# Patient Record
Sex: Female | Born: 1960 | Race: White | Hispanic: No | Smoking: Former smoker
Health system: Southern US, Community
[De-identification: ages and names within clinical notes are randomized; demographics above are authoritative.]

## PROBLEM LIST (undated history)

## (undated) DIAGNOSIS — R51 Headache: Secondary | ICD-10-CM

## (undated) DIAGNOSIS — R002 Palpitations: Secondary | ICD-10-CM

## (undated) DIAGNOSIS — Z9889 Other specified postprocedural states: Secondary | ICD-10-CM

## (undated) DIAGNOSIS — M1990 Unspecified osteoarthritis, unspecified site: Secondary | ICD-10-CM

## (undated) DIAGNOSIS — G8929 Other chronic pain: Secondary | ICD-10-CM

## (undated) DIAGNOSIS — J449 Chronic obstructive pulmonary disease, unspecified: Secondary | ICD-10-CM

## (undated) DIAGNOSIS — F319 Bipolar disorder, unspecified: Secondary | ICD-10-CM

## (undated) DIAGNOSIS — E785 Hyperlipidemia, unspecified: Secondary | ICD-10-CM

## (undated) DIAGNOSIS — R519 Headache, unspecified: Secondary | ICD-10-CM

## (undated) DIAGNOSIS — G629 Polyneuropathy, unspecified: Secondary | ICD-10-CM

## (undated) DIAGNOSIS — T8859XA Other complications of anesthesia, initial encounter: Secondary | ICD-10-CM

## (undated) DIAGNOSIS — I10 Essential (primary) hypertension: Secondary | ICD-10-CM

## (undated) DIAGNOSIS — I82409 Acute embolism and thrombosis of unspecified deep veins of unspecified lower extremity: Secondary | ICD-10-CM

## (undated) DIAGNOSIS — K219 Gastro-esophageal reflux disease without esophagitis: Secondary | ICD-10-CM

## (undated) DIAGNOSIS — R112 Nausea with vomiting, unspecified: Secondary | ICD-10-CM

## (undated) DIAGNOSIS — T4145XA Adverse effect of unspecified anesthetic, initial encounter: Secondary | ICD-10-CM

## (undated) DIAGNOSIS — G4730 Sleep apnea, unspecified: Secondary | ICD-10-CM

## (undated) HISTORY — DX: Polyneuropathy, unspecified: G62.9

## (undated) HISTORY — PX: FOOT SURGERY: SHX648

## (undated) HISTORY — DX: Hyperlipidemia, unspecified: E78.5

## (undated) HISTORY — DX: Chronic obstructive pulmonary disease, unspecified: J44.9

## (undated) HISTORY — DX: Acute embolism and thrombosis of unspecified deep veins of unspecified lower extremity: I82.409

## (undated) HISTORY — DX: Other chronic pain: G89.29

---

## 1990-06-06 HISTORY — PX: TUBAL LIGATION: SHX77

## 1996-06-06 HISTORY — PX: ABDOMINAL HYSTERECTOMY: SHX81

## 2000-04-02 ENCOUNTER — Emergency Department (HOSPITAL_COMMUNITY): Admission: EM | Admit: 2000-04-02 | Discharge: 2000-04-02 | Payer: Self-pay | Admitting: Emergency Medicine

## 2000-04-02 ENCOUNTER — Encounter: Payer: Self-pay | Admitting: Emergency Medicine

## 2000-05-08 ENCOUNTER — Encounter (INDEPENDENT_AMBULATORY_CARE_PROVIDER_SITE_OTHER): Payer: Self-pay | Admitting: Specialist

## 2000-05-08 ENCOUNTER — Ambulatory Visit (HOSPITAL_COMMUNITY): Admission: RE | Admit: 2000-05-08 | Discharge: 2000-05-08 | Payer: Self-pay | Admitting: Gastroenterology

## 2001-01-07 ENCOUNTER — Encounter: Payer: Self-pay | Admitting: Orthopedic Surgery

## 2001-01-07 ENCOUNTER — Ambulatory Visit (HOSPITAL_COMMUNITY): Admission: RE | Admit: 2001-01-07 | Discharge: 2001-01-07 | Payer: Self-pay | Admitting: Orthopedic Surgery

## 2001-12-04 ENCOUNTER — Other Ambulatory Visit: Admission: RE | Admit: 2001-12-04 | Discharge: 2001-12-04 | Payer: Self-pay | Admitting: *Deleted

## 2002-12-05 ENCOUNTER — Other Ambulatory Visit: Admission: RE | Admit: 2002-12-05 | Discharge: 2002-12-05 | Payer: Self-pay | Admitting: *Deleted

## 2003-01-21 ENCOUNTER — Encounter: Payer: Self-pay | Admitting: Gastroenterology

## 2003-01-21 ENCOUNTER — Encounter: Admission: RE | Admit: 2003-01-21 | Discharge: 2003-01-21 | Payer: Self-pay | Admitting: Gastroenterology

## 2003-06-07 HISTORY — PX: CHOLECYSTECTOMY: SHX55

## 2003-12-10 ENCOUNTER — Other Ambulatory Visit: Admission: RE | Admit: 2003-12-10 | Discharge: 2003-12-10 | Payer: Self-pay | Admitting: *Deleted

## 2004-02-10 ENCOUNTER — Emergency Department (HOSPITAL_COMMUNITY): Admission: EM | Admit: 2004-02-10 | Discharge: 2004-02-10 | Payer: Self-pay | Admitting: Family Medicine

## 2004-04-05 ENCOUNTER — Encounter (INDEPENDENT_AMBULATORY_CARE_PROVIDER_SITE_OTHER): Payer: Self-pay | Admitting: Specialist

## 2004-04-05 ENCOUNTER — Ambulatory Visit (HOSPITAL_COMMUNITY): Admission: RE | Admit: 2004-04-05 | Discharge: 2004-04-05 | Payer: Self-pay | Admitting: Gastroenterology

## 2004-08-23 ENCOUNTER — Ambulatory Visit (HOSPITAL_COMMUNITY): Admission: RE | Admit: 2004-08-23 | Discharge: 2004-08-23 | Payer: Self-pay | Admitting: Gastroenterology

## 2004-08-24 ENCOUNTER — Ambulatory Visit (HOSPITAL_COMMUNITY): Admission: RE | Admit: 2004-08-24 | Discharge: 2004-08-24 | Payer: Self-pay | Admitting: Gastroenterology

## 2004-08-26 ENCOUNTER — Encounter: Admission: RE | Admit: 2004-08-26 | Discharge: 2004-08-26 | Payer: Self-pay | Admitting: General Surgery

## 2004-09-02 ENCOUNTER — Encounter (INDEPENDENT_AMBULATORY_CARE_PROVIDER_SITE_OTHER): Payer: Self-pay | Admitting: Specialist

## 2004-09-02 ENCOUNTER — Ambulatory Visit (HOSPITAL_COMMUNITY): Admission: RE | Admit: 2004-09-02 | Discharge: 2004-09-02 | Payer: Self-pay | Admitting: General Surgery

## 2005-02-16 ENCOUNTER — Other Ambulatory Visit: Admission: RE | Admit: 2005-02-16 | Discharge: 2005-02-16 | Payer: Self-pay | Admitting: *Deleted

## 2006-02-20 ENCOUNTER — Other Ambulatory Visit: Admission: RE | Admit: 2006-02-20 | Discharge: 2006-02-20 | Payer: Self-pay | Admitting: *Deleted

## 2006-06-06 HISTORY — PX: OTHER SURGICAL HISTORY: SHX169

## 2006-07-12 ENCOUNTER — Encounter: Admission: RE | Admit: 2006-07-12 | Discharge: 2006-07-12 | Payer: Self-pay | Admitting: Family Medicine

## 2006-07-18 ENCOUNTER — Encounter: Admission: RE | Admit: 2006-07-18 | Discharge: 2006-07-18 | Payer: Self-pay | Admitting: Family Medicine

## 2007-01-18 ENCOUNTER — Encounter: Admission: RE | Admit: 2007-01-18 | Discharge: 2007-01-18 | Payer: Self-pay | Admitting: Family Medicine

## 2007-01-21 ENCOUNTER — Encounter: Admission: RE | Admit: 2007-01-21 | Discharge: 2007-01-21 | Payer: Self-pay | Admitting: Family Medicine

## 2007-02-21 ENCOUNTER — Other Ambulatory Visit: Admission: RE | Admit: 2007-02-21 | Discharge: 2007-02-21 | Payer: Self-pay | Admitting: *Deleted

## 2007-03-07 ENCOUNTER — Encounter
Admission: RE | Admit: 2007-03-07 | Discharge: 2007-03-07 | Payer: Self-pay | Admitting: Physical Medicine and Rehabilitation

## 2007-05-14 ENCOUNTER — Ambulatory Visit (HOSPITAL_COMMUNITY): Admission: RE | Admit: 2007-05-14 | Discharge: 2007-05-14 | Payer: Self-pay | Admitting: Orthopedic Surgery

## 2008-03-17 ENCOUNTER — Other Ambulatory Visit: Admission: RE | Admit: 2008-03-17 | Discharge: 2008-03-17 | Payer: Self-pay | Admitting: Gynecology

## 2010-06-27 ENCOUNTER — Encounter: Payer: Self-pay | Admitting: Gastroenterology

## 2010-06-28 ENCOUNTER — Encounter: Payer: Self-pay | Admitting: Physical Medicine and Rehabilitation

## 2010-10-19 NOTE — Op Note (Signed)
NAMEJINNA, WEINMAN               ACCOUNT NO.:  [id number]   MEDICAL RECORD NO.:  [id number]          PATIENT TYPE:  AMB   LOCATION:  DAY                          FACILITY:  [facility], [place]   PHYSICIAN:  Ollen Gross, M.D.    DATE OF BIRTH:  April 02, 1961   DATE OF PROCEDURE:  05/14/2007  DATE OF DISCHARGE:                               OPERATIVE REPORT   PREOPERATIVE DIAGNOSIS:  Left hip labral tear plus chondral defect of  acetabulum.   POSTOPERATIVE DIAGNOSIS:  Left hip labral tear plus chondral defect of  acetabulum.   PROCEDURE:  Left hip arthroscopy with labral debridement and  chondroplasty.   SURGEON:  Ollen Gross, M.D.   ASSISTANT:  Avel Peace PA-C   ANESTHESIA:  General.   BLOOD LOSS:  Minimal.   DRAINS:  None.   COMPLICATIONS:  None.   CONDITION:  Stable to recovery room.   BRIEF CLINICAL NOTE:  Christy is a 50 year old female abraded with  greater than a 1-year history of significant left hip pain and popping.  Radiographs show minimal degenerative change.  MRI shows probable labral  tear.  Given her intractable pain and failure of nonoperative  management, she presents now for arthroscopy with debridement.   PROCEDURE IN DETAIL:  After successful administration of general  anesthetic, the patient was placed in a right lateral decubitus position  with the left side up and bottom leg well padded.  The left leg was  draped over the well-padded perineal post and foot placed in well-padded  traction boot.  Under fluoroscopic guidance traction is applied across  the left lower extremity until the hip was adequately distracted.  The  thigh was then prepped and draped in the usual sterile fashion.  Spinal  needles were then passed in the anterior posterior peritrochanteric  portal sites.  Both are confirmed to enter the joint about passing  saline through the posterior spinal needle and having it outflow through  the anterior needle.  The posterior portal was created  with a knife and  then with the cannulated dilators, we placed the inflow cannula and  camera.  Arthroscopic visualization proceeds.  Femoral head looks normal  throughout its entire extent.  The fovea looks normal.  The posterior  half of the joint and superior posterior two thirds looked fine.  Anteriorly there is delamination of the anterior inferior acetabular  cartilage.  This is in about a 1 x 1 cm area at about the 7 and 8  o'clock position.  There is a labral tear associated with it.  The  labral tear goes up to about the 9 o'clock position.  Anterior portal  was created and the labral tear is debrided back to stable base with the  shaver and sealed off with a ArthroCare device.  We deroofed the  unstable cartilage from the underlying acetabular bone.  This was done  with the shaver and the ArthroCare.  We then shaved down the exposed  bone to abrade it or hopeful formation of fibrocartilage.  The total  area was about 1 x 0.5 cm.  The edges were probed  and found to be  stable.  The rest of the cartilage in the adjacent area looks normal.  The joint is again inspected.  No further cartilage tears or loose  bodies.  The arthroscopic equipment was then removed from the anterior  portal.  20 mL of 0.25% Marcaine with epi injected through the inflow  cannula and that is removed.  Once the equipment is out then we released  the traction and the hip concentrically reduced.  The incision is then  closed interrupted 4-0 nylon and the patient is removed from  the  traction boot, placed in supine position.  She is subsequently awakened  and transferred to recovery in stable condition.      Ollen Gross, M.D.  Electronically Signed     FA/MEDQ  D:  05/14/2007  T:  05/15/2007  Job:  [id number]

## 2010-10-22 NOTE — Op Note (Signed)
[name], Dana [name]               ACCOUNT NO.:  [id number]   MEDICAL RECORD NO.:  [id number]          PATIENT TYPE:  AMB   LOCATION:  DAY                          FACILITY:  [facility]   PHYSICIAN:  Angelia Mould. Derrell Lolling, M.D.DATE OF BIRTH:  [birth year]   DATE OF PROCEDURE:  09/02/2004  DATE OF DISCHARGE:                                 OPERATIVE REPORT   PREOPERATIVE DIAGNOSIS:  Biliary dyskinesia.   POSTOPERATIVE DIAGNOSIS:  Biliary dyskinesia.   OPERATION PERFORMED:  A laparoscopic cholecystectomy with intraoperative  cholangiogram.   SURGEON:  Dr. Claud Kelp   ASSISTANT:  Dr. Francina Ames   OPERATIVE INDICATIONS:  This is a 50 year old white female, who has a  history of tubal ligation, cesarean section, hysterectomy, bipolar disorder.  She has a recent onset of intermittent episodes of right upper quadrant  pain.  She has had nausea.  She has vomited once.  No fever or chills.  She  has had gallbladder ultrasound on two occasions which showed no  abnormalities.  Hepatobiliary scan shows an abnormal ejection fraction of  5.5% which is well below the normal of 30%.  She has a family history of  gallstone disease.  On exam, she has a little bit of tenderness in the right  upper quadrant but nothing very dramatic.  I felt that she had reasonably  high probability of biliary dyskinesia.  She has had a CT scan which really  was not very revealing.  She was given the option of proceeding with  cholecystectomy at this time which she requested.  She is brought to the  operating room electively.   OPERATIVE FINDINGS:  The gallbladder was thin-walled.  The anatomy of the  cystic duct, cystic artery, and common bile duct were normal.  The  cholangiogram was normal, showing normal biliary anatomy, no filling  defects, and no obstruction.  The liver looked healthy.  The stomach and  duodenum, small intestine, large intestine, omentum and peritoneal surfaces  looked fine.   OPERATIVE  TECHNIQUE:  Following the induction of general endotracheal  anesthesia, the patient's abdomen was prepped and draped in a sterile  fashion.  Intravenous antibiotics were given.  Marcaine 0.5% with  epinephrine was used as a local infiltration anesthetic.  A vertically-  oriented incision was made in the lower rim of the umbilicus through a  previous laparoscopy scar.  The fascia was somewhat scarred but was elevated  and incised in the midline, and the abdominal cavity was entered under  direct vision in an atraumatic fashion.  A 10 mm Hassan trocar was inserted,  and it was secured with the pursestring suture of 0 Vicryl. Pneumoperitoneum  was created.  Video cam was inserted with visualization and findings as  described above.  A 10 mm trocar was placed the subxiphoid region, and two 5  mm trocars placed in the right mid abdomen.  The gallbladder was elevated.  The infundibulum was retracted laterally.  I dissected out the cystic duct  and the cystic artery.  The cystic artery was isolated as it went onto the  gallbladder wall, secured with  multiple metal clips, and divided.  This  created a nice window behind the cystic duct.  A metal clip was placed on  the cystic duct close the gallbladder.  A cholangiogram catheter was  inserted into the cystic duct.  A cholangiogram was obtained with the C-arm.  This showed normal intrahepatic and extrahepatic biliary anatomy, no filling  defect, and prompt flow of contrast into the duodenum without obstruction.  The cholangiogram catheter was removed.  The cystic duct was secured  multiple metal clips and divided.  The gallbladder was dissected from its  bed with electrocautery and removed through the umbilical port.  The  operative field was inspected and irrigated.  At the completion of the case,  there was no bleeding and no bile leak whatsoever in the operative field,  and the irrigation fluid was completely clear.  The trocars were removed   under direct vision, and there was no bleeding from the trocar sites.  Pneumoperitoneum was released.  The fascia at the umbilicus was closed with  0 Vicryl sutures.  The skin incisions were closed with subcuticular sutures  of 4-0 Vicryl and Steri-Strips.  Clean bandages were placed and the patient  taken to the recovery room in stable condition.  Estimated blood loss was  about 10 mL.  Complications were none.  Sponge, needle, and instrument  counts were correct.      HMI/MEDQ  D:  09/02/2004  T:  09/02/2004  Job:  [id number]   cc:   Bernette Redbird, M.D.  [address] [place]  [place], [place] [address]  Fax: [phone]   Chales Salmon. Abigail Miyamoto, M.D.  [address]  [place]  [place] [address]  Fax: [phone]

## 2010-10-22 NOTE — Op Note (Signed)
NAMETAIMANE, Dana [name]               ACCOUNT NO.:  [id number]   MEDICAL RECORD NO.:  [id number]          PATIENT TYPE:  AMB   LOCATION:  ENDO                         FACILITY:  [facility]   PHYSICIAN:  Bernette Redbird, M.D.   DATE OF BIRTH:  03-12-1961   DATE OF PROCEDURE:  04/05/2004  DATE OF DISCHARGE:                                 OPERATIVE REPORT   PROCEDURE:  Colonoscopy with biopsies.   INDICATION:  Followup of prior small colonic adenoma removed about four  years ago, plus recent rectal bleeding.  There was also a family history of  colon cancer.   ENDOSCOPIST:  Bernette Redbird, M.D.   FINDINGS:  Several diminutive polyps biopsied.   DESCRIPTION OF PROCEDURE:  The nature, purpose, and risks of the procedure  were known to the patient who provided written consent.  Sedation was  fentanyl 75 mcg and Versed 9 mg IV without arrhythmias or desaturation.  The  patient was also premedicated with Phenergan 12.5 mg IV to help augment  sedation since she is chronically on Lithium.  The Olympus adult  videocolonoscope was advanced without too much difficulty around the colon  to the cecum, applying external abdominal compression to control looping.  The cecum was identified by clear visualization of the appendiceal orifice  and pullback was then performed.   Near the hepatic flexure were two or three diminutive sessile polyps removed  by cold biopsy, and an additional one or two polyps were encountered in the  rectosigmoid region and cold biopsied.  No large polyps, cancer, colitis,  vascular malformations, or diverticular disease were observed.  Retroflexion  in the rectum and pullout through the anal canal did not demonstrate any  significant abnormalities, but the patient did have some mild internal  hemorrhoids.  No specific source of bleeding other than the internal  hemorrhoids was identified.   The patient tolerated the procedure well, and there were no apparent   complications.   IMPRESSION:  1.  Small colon polyps removed as described above (211.3).  2.  Prior history of colonic adenoma and family history of colon cancer.  3.  Rectal bleeding presumably of hemorrhoidal origin based on negative      findings from today's exam.   PLAN:  Follow-up colonoscopy in five years.  Await pathology results.       RB/MEDQ  D:  04/05/2004  T:  04/05/2004  Job:  [id number]   cc:   Chales Salmon. Abigail Miyamoto, M.D.  [address]  [name]  [place] [address]  Fax: [phone]

## 2010-10-22 NOTE — Procedures (Signed)
[facility]  [name]:    Dana [name], Dana [name]                         MRN: [id number] Proc. Date: 05/08/00 Adm. Date:  [id number] Attending:  Rich Brave CC:         Dellis Anes. [name], M.D.  [name], M.D.  D[name], M.D.   Procedure Report  PROCEDURE:  Colonoscopy with biopsies.  ENDOSCOPIST:  Florencia Reasons, M.D.  INDICATION:  [age]-year-old female for followup of adenomatous polyps removed several years ago.  In addition, she has been having persistent suprapubic pain, the etiology of which is unclear.  FINDINGS:  Rectosigmoid spasm and pain during exam, possibly related to her underlying abdominal pain.  Diminutive colonic polyp removed by cold biopsy technique.  DESCRIPTION OF PROCEDURE:  The the nature, purpose and risks of the procedure were familiar to the [name] from prior examination and she provided written consent.  Sedation was droperidol 2.5 mg, Fentanyl 75 mcg and Versed 6 mg IV, without arrhythmias or desaturation.  The Olympus adult video colonoscope was advanced without too much difficulty to the cecum and pullback was then performed.  The cecum was identified by visualization of typical cecal appearance and the absence of further lumen.  Pullback was then performed. The quality of the prep was excellent and it is felt that all areas were well-seen.  In the rectosigmoid region, there was quite a bit of spasm and advancement of the scope through this area was associated with fairly exquisite discomfort for the [name], compared to other parts of the colon.  The clinical significance of this is unclear but since there was really not much resistance or technical difficulty in advancing the scope, yet the [name] was experiencing quite a bit of discomfort, I wonder whether there is some form of visceral hypersensitivity playing a role here.  There was a diminutive sessile polyp, which I biopsied from the  colon (exact site not known), but I think it was in the left colon.  No large polyps, cancer, colitis, vascular malformations or diverticulosis were observed and retroflexion in the rectum was unremarkable.  The [name] tolerated the procedure well and there were no apparent complications.  IMPRESSION 1. Diminutive colonic polyp, removed. 2. Prior history of adenomas. 3. History of suprapubic pain with some degree of spasm and/or pain evoked by    examining the rectosigmoid area with the scope, clinical significance    uncertain.  PLAN 1. Await pathology on todays polyp results. 2. Consider antispasmodic therapy. 3. Consider followup colonoscopy in three to five years, in view of the prior    history of colon polyps.  The interval will depend in part on the current    histology of her polyp. DD:  05/08/00 TD:  05/09/00 Job: [id number]

## 2010-11-22 ENCOUNTER — Other Ambulatory Visit: Payer: Self-pay | Admitting: Gastroenterology

## 2011-03-14 LAB — COMPREHENSIVE METABOLIC PANEL
AST: 17
BUN: 6
Creatinine, Ser: 0.74
Glucose, Bld: 97
Potassium: 3.6
Sodium: 141
Total Bilirubin: 1

## 2011-03-14 LAB — URINALYSIS, ROUTINE W REFLEX MICROSCOPIC
Bilirubin Urine: NEGATIVE
Ketones, ur: NEGATIVE
Nitrite: NEGATIVE
Protein, ur: NEGATIVE
pH: 6

## 2011-03-14 LAB — ABO/RH: ABO/RH(D): A POS

## 2011-03-14 LAB — TYPE AND SCREEN: ABO/RH(D): A POS

## 2011-03-14 LAB — CBC
Hemoglobin: 15.1 — ABNORMAL HIGH
MCHC: 35.6
MCV: 86.1
WBC: 8.6

## 2011-03-14 LAB — APTT: aPTT: 27

## 2011-03-14 LAB — PROTIME-INR: INR: 1

## 2011-10-21 ENCOUNTER — Ambulatory Visit
Admission: RE | Admit: 2011-10-21 | Discharge: 2011-10-21 | Disposition: A | Discharge status: Home / Self Care | Payer: BC Managed Care – PPO | Source: Ambulatory Visit | Attending: Orthopedic Surgery | Admitting: Orthopedic Surgery

## 2011-10-21 ENCOUNTER — Other Ambulatory Visit: Payer: Self-pay | Admitting: Orthopedic Surgery

## 2011-10-21 DIAGNOSIS — R52 Pain, unspecified: Secondary | ICD-10-CM

## 2012-04-05 DIAGNOSIS — F319 Bipolar disorder, unspecified: Secondary | ICD-10-CM | POA: Insufficient documentation

## 2013-06-06 HISTORY — PX: OTHER SURGICAL HISTORY: SHX169

## 2013-09-13 DIAGNOSIS — H579 Unspecified disorder of eye and adnexa: Secondary | ICD-10-CM | POA: Insufficient documentation

## 2013-09-27 DIAGNOSIS — I10 Essential (primary) hypertension: Secondary | ICD-10-CM | POA: Insufficient documentation

## 2013-09-27 DIAGNOSIS — Z09 Encounter for follow-up examination after completed treatment for conditions other than malignant neoplasm: Secondary | ICD-10-CM | POA: Insufficient documentation

## 2014-02-28 DIAGNOSIS — G43109 Migraine with aura, not intractable, without status migrainosus: Secondary | ICD-10-CM | POA: Insufficient documentation

## 2014-08-06 DIAGNOSIS — E785 Hyperlipidemia, unspecified: Secondary | ICD-10-CM | POA: Insufficient documentation

## 2015-02-17 ENCOUNTER — Ambulatory Visit: Payer: Self-pay | Admitting: Orthopedic Surgery

## 2015-02-17 NOTE — Progress Notes (Signed)
Preoperative surgical orders have been place into the Epic hospital system for Dana [name] on 02/17/2015, 4:46 PM  by Patrica Duel for surgery on 03-18-2015.  Preop Total Hip - Anterior Approach orders including IV Tylenol, and IV Decadron as long as there are no contraindications to the above medications. Avel Peace, PA-C

## 2015-03-10 ENCOUNTER — Other Ambulatory Visit (HOSPITAL_COMMUNITY): Payer: Self-pay | Admitting: *Deleted

## 2015-03-10 NOTE — Progress Notes (Signed)
[facility] 10-06-14 on chart

## 2015-03-10 NOTE — Patient Instructions (Addendum)
Dana [name]  03/10/2015   Your procedure is scheduled on: Wednesday October 12th, 2016  Report to [facility]  Entrance take [place]  elevators to 3rd floor to  Short Stay Center at 845 AM.  Call this number if you have problems the morning of surgery [phone]   Remember: ONLY 1 PERSON MAY GO WITH YOU TO SHORT STAY TO GET  READY MORNING OF YOUR SURGERY.  Do not eat food or drink liquids :After Midnight.  BRING CPAP MASK AND TUBING BRING ENVELOPE FROM [name]   Take these medicines the morning of surgery with A SIP OF WATER: Lithium Carbonate, Omeprazole (Prilosec), Hydrocone if needed                               You may not have any metal on your body including hair pins and              piercings  Do not wear jewelry, make-up, lotions, powders or perfumes, deodorant             Do not wear nail polish.  Do not shave  48 hours prior to surgery.              Men may shave face and neck.   Do not bring valuables to the hospital. [facility] IS NOT             RESPONSIBLE   FOR VALUABLES.  Contacts, dentures or bridgework may not be worn into surgery.  Leave suitcase in the car. After surgery it may be brought to your room.     Patients discharged the [name] of surgery will not be allowed to drive home.  Name and phone number of your driver:  Special Instructions: N/A              Please read over the following fact sheets you were given: _____________________________________________________________________             [facility] - Preparing for Surgery Before surgery, you can play an important role.  Because skin is not sterile, your skin needs to be as free of germs as possible.  You can reduce the number of germs on your skin by washing with CHG (chlorahexidine gluconate) soap before surgery.  CHG is an antiseptic cleaner which kills germs and bonds with the skin to continue killing germs even after washing. Please DO NOT use if you  have an allergy to CHG or antibacterial soaps.  If your skin becomes reddened/irritated stop using the CHG and inform your nurse when you arrive at Short Stay. Do not shave (including legs and underarms) for at least 48 hours prior to the first CHG shower.  You may shave your face/neck. Please follow these instructions carefully:  1.  Shower with CHG Soap the night before surgery and the  morning of Surgery.  2.  If you choose to wash your hair, wash your hair first as usual with your  normal  shampoo.  3.  After you shampoo, rinse your hair and body thoroughly to remove the  shampoo.                           4.  Use CHG as you would any other liquid soap.  You can apply  chg directly  to the skin and wash                       Gently with a scrungie or clean washcloth.  5.  Apply the CHG Soap to your body ONLY FROM THE NECK DOWN.   Do not use on face/ open                           Wound or open sores. Avoid contact with eyes, ears mouth and genitals (private parts).                       Wash face,  Genitals (private parts) with your normal soap.             6.  Wash thoroughly, paying special attention to the area where your surgery  will be performed.  7.  Thoroughly rinse your body with warm water from the neck down.  8.  DO NOT shower/wash with your normal soap after using and rinsing off  the CHG Soap.                9.  Pat yourself dry with a clean towel.            10.  Wear clean pajamas.            11.  Place clean sheets on your bed the night of your first shower and do not  sleep with pets. [name] : Do not apply any lotions/deodorants the morning of surgery.  Please wear clean clothes to the hospital/surgery center.  FAILURE TO FOLLOW THESE INSTRUCTIONS MAY RESULT IN THE CANCELLATION OF YOUR SURGERY PATIENT SIGNATURE_________________________________  NURSE  SIGNATURE__________________________________  ________________________________________________________________________   [name]  An incentive spirometer is a tool that can help keep your lungs clear and active. This tool measures how well you are filling your lungs with each breath. Taking long deep breaths may help reverse or decrease the chance of developing breathing (pulmonary) problems (especially infection) following:  A long period of time when you are unable to move or be active. BEFORE THE PROCEDURE   If the spirometer includes an indicator to show your best effort, your nurse or respiratory therapist will set it to a desired goal.  If possible, sit up straight or lean slightly forward. Try not to slouch.  Hold the incentive spirometer in an upright position. INSTRUCTIONS FOR USE   Sit on the edge of your bed if possible, or sit up as far as you can in bed or on a chair.  Hold the incentive spirometer in an upright position.  Breathe out normally.  Place the mouthpiece in your mouth and seal your lips tightly around it.  Breathe in slowly and as deeply as possible, raising the piston or the ball toward the top of the column.  Hold your breath for 3-5 seconds or for as long as possible. Allow the piston or ball to fall to the bottom of the column.  Remove the mouthpiece from your mouth and breathe out normally.  Rest for a few seconds and repeat Steps 1 through 7 at least 10 times every 1-2 hours when you are awake. Take your time and take a few normal breaths between deep breaths.  The spirometer may include an indicator to show your best effort. Use the indicator as a goal to work toward during each repetition.  After each set of 10 deep breaths, practice coughing to be sure your lungs are clear. If you have an incision (the cut made at the time of surgery), support your incision when coughing by placing a pillow or rolled up towels firmly against it. Once  you are able to get out of bed, walk around indoors and cough well. You may stop using the incentive spirometer when instructed by your caregiver.  RISKS AND COMPLICATIONS  Take your time so you do not get dizzy or light-headed.  If you are in pain, you may need to take or ask for pain medication before doing incentive spirometry. It is harder to take a deep breath if you are having pain. AFTER USE  Rest and breathe slowly and easily.  It can be helpful to keep track of a log of your progress. Your caregiver can provide you with a simple table to help with this. If you are using the spirometer at home, follow these instructions: [place] IF:   You are having difficultly using the spirometer.  You have trouble using the spirometer as often as instructed.  Your pain medication is not giving enough relief while using the spirometer.  You develop fever of 100.5 F (38.1 C) or higher. SEEK IMMEDIATE MEDICAL CARE IF:   You cough up bloody sputum that had not been present before.  You develop fever of 102 F (38.9 C) or greater.  You develop worsening pain at or near the incision site. MAKE SURE YOU:   Understand these instructions.  Will watch your condition.  Will get help right away if you are not doing well or get worse. Document Released: 10/03/2006 Document Revised: 08/15/2011 Document Reviewed: 12/04/2006 Exit[employer] Patient Information 2014 Exit[employer], [place].   ________________________________________________________________________  WHAT IS A BLOOD TRANSFUSION? Blood Transfusion Information  A transfusion is the replacement of blood or some of its parts. Blood is made up of multiple cells which provide different functions.  Red blood cells carry oxygen and are used for blood loss replacement.  White blood cells fight against infection.  Platelets control bleeding.  Plasma helps clot blood.  Other blood products are available for specialized needs, such as  hemophilia or other clotting disorders. BEFORE THE TRANSFUSION  Who gives blood for transfusions?   Healthy volunteers who are fully evaluated to make sure their blood is safe. This is blood bank blood. Transfusion therapy is the safest it has ever been in the practice of medicine. Before blood is taken from a donor, a complete history is taken to make sure that person has no history of diseases nor engages in risky social behavior (examples are intravenous drug use or sexual activity with multiple partners). The donor's travel history is screened to minimize risk of transmitting infections, such as malaria. The donated blood is tested for signs of infectious diseases, such as HIV and hepatitis. The blood is then tested to be sure it is compatible with you in order to minimize the chance of a transfusion reaction. If you or a relative donates blood, this is often done in anticipation of surgery and is not appropriate for emergency situations. It takes many days to process the donated blood. RISKS AND COMPLICATIONS Although transfusion therapy is very safe and saves many lives, the main dangers of transfusion include:   Getting an infectious disease.  Developing a transfusion reaction. This is an allergic reaction to something in the blood you were given. Every precaution is taken to prevent this. The decision  to have a blood transfusion has been considered carefully by your caregiver before blood is given. Blood is not given unless the benefits outweigh the risks. AFTER THE TRANSFUSION  Right after receiving a blood transfusion, you will usually feel much better and more energetic. This is especially true if your red blood cells have gotten low (anemic). The transfusion raises the level of the red blood cells which carry oxygen, and this usually causes an energy increase.  The nurse administering the transfusion will monitor you carefully for complications. HOME CARE INSTRUCTIONS  No special  instructions are needed after a transfusion. You may find your energy is better. Speak with your caregiver about any limitations on activity for underlying diseases you may have. SEEK MEDICAL CARE IF:   Your condition is not improving after your transfusion.  You develop redness or irritation at the intravenous (IV) site. SEEK IMMEDIATE MEDICAL CARE IF:  Any of the following symptoms occur over the next 12 hours:  Shaking chills.  You have a temperature by mouth above 102 F (38.9 C), not controlled by medicine.  Chest, back, or muscle pain.  People around you feel you are not acting correctly or are confused.  Shortness of breath or difficulty breathing.  Dizziness and fainting.  You get a rash or develop hives.  You have a decrease in urine output.  Your urine turns a dark color or changes to pink, red, or brown. Any of the following symptoms occur over the next 10 days:  You have a temperature by mouth above 102 F (38.9 C), not controlled by medicine.  Shortness of breath.  Weakness after normal activity.  The white part of the eye turns yellow (jaundice).  You have a decrease in the amount of urine or are urinating less often.  Your urine turns a dark color or changes to pink, red, or brown. Document Released: 05/20/2000 Document Revised: 08/15/2011 Document Reviewed: 01/07/2008 [facility] Patient Information 2014 [place], [place].  _______________________________________________________________________

## 2015-03-11 ENCOUNTER — Encounter (HOSPITAL_COMMUNITY): Payer: Self-pay

## 2015-03-11 ENCOUNTER — Encounter (HOSPITAL_COMMUNITY)
Admission: RE | Admit: 2015-03-11 | Discharge: 2015-03-11 | Disposition: A | Discharge status: Home / Self Care | Payer: BLUE CROSS/BLUE SHIELD | Source: Ambulatory Visit | Attending: Orthopedic Surgery | Admitting: Orthopedic Surgery

## 2015-03-11 DIAGNOSIS — Z01818 Encounter for other preprocedural examination: Secondary | ICD-10-CM | POA: Insufficient documentation

## 2015-03-11 DIAGNOSIS — M1612 Unilateral primary osteoarthritis, left hip: Secondary | ICD-10-CM | POA: Diagnosis not present

## 2015-03-11 HISTORY — DX: Essential (primary) hypertension: I10

## 2015-03-11 HISTORY — DX: Bipolar disorder, unspecified: F31.9

## 2015-03-11 HISTORY — DX: Unspecified osteoarthritis, unspecified site: M19.90

## 2015-03-11 HISTORY — DX: Other specified postprocedural states: Z98.890

## 2015-03-11 HISTORY — DX: Palpitations: R00.2

## 2015-03-11 HISTORY — DX: Other complications of anesthesia, initial encounter: T88.59XA

## 2015-03-11 HISTORY — DX: Adverse effect of unspecified anesthetic, initial encounter: T41.45XA

## 2015-03-11 HISTORY — DX: Headache: R51

## 2015-03-11 HISTORY — DX: Gastro-esophageal reflux disease without esophagitis: K21.9

## 2015-03-11 HISTORY — DX: Sleep apnea, unspecified: G47.30

## 2015-03-11 HISTORY — DX: Headache, unspecified: R51.9

## 2015-03-11 HISTORY — DX: Other specified postprocedural states: R11.2

## 2015-03-11 LAB — CBC
HCT: 42.2 % (ref 36.0–46.0)
HEMOGLOBIN: 13.6 g/dL (ref 12.0–15.0)
MCH: 28.8 pg (ref 26.0–34.0)
MCHC: 32.2 g/dL (ref 30.0–36.0)
MCV: 89.2 fL (ref 78.0–100.0)
Platelets: 219 10*3/uL (ref 150–400)
RBC: 4.73 MIL/uL (ref 3.87–5.11)
RDW: 14 % (ref 11.5–15.5)
WBC: 6.8 10*3/uL (ref 4.0–10.5)

## 2015-03-11 LAB — PROTIME-INR
INR: 0.93 (ref 0.00–1.49)
PROTHROMBIN TIME: 12.7 s (ref 11.6–15.2)

## 2015-03-11 LAB — URINALYSIS, ROUTINE W REFLEX MICROSCOPIC
Bilirubin Urine: NEGATIVE
GLUCOSE, UA: NEGATIVE mg/dL
HGB URINE DIPSTICK: NEGATIVE
Ketones, ur: NEGATIVE mg/dL
LEUKOCYTES UA: NEGATIVE
Nitrite: NEGATIVE
PH: 7 (ref 5.0–8.0)
PROTEIN: NEGATIVE mg/dL
SPECIFIC GRAVITY, URINE: 1.004 — AB (ref 1.005–1.030)
Urobilinogen, UA: 0.2 mg/dL (ref 0.0–1.0)

## 2015-03-11 LAB — COMPREHENSIVE METABOLIC PANEL
ALK PHOS: 83 U/L (ref 38–126)
ALT: 28 U/L (ref 14–54)
ANION GAP: 7 (ref 5–15)
AST: 27 U/L (ref 15–41)
Albumin: 4.5 g/dL (ref 3.5–5.0)
BUN: 11 mg/dL (ref 6–20)
CALCIUM: 10.1 mg/dL (ref 8.9–10.3)
CO2: 25 mmol/L (ref 22–32)
CREATININE: 1.02 mg/dL — AB (ref 0.44–1.00)
Chloride: 110 mmol/L (ref 101–111)
Glucose, Bld: 103 mg/dL — ABNORMAL HIGH (ref 65–99)
Potassium: 4.1 mmol/L (ref 3.5–5.1)
SODIUM: 142 mmol/L (ref 135–145)
TOTAL PROTEIN: 7.7 g/dL (ref 6.5–8.1)
Total Bilirubin: 0.4 mg/dL (ref 0.3–1.2)

## 2015-03-11 LAB — SURGICAL PCR SCREEN
MRSA, PCR: NEGATIVE
STAPHYLOCOCCUS AUREUS: NEGATIVE

## 2015-03-11 LAB — APTT: aPTT: 25 seconds (ref 24–37)

## 2015-03-17 ENCOUNTER — Ambulatory Visit: Payer: Self-pay | Admitting: Orthopedic Surgery

## 2015-03-17 NOTE — H&P (Signed)
Dana [name] DOB: 1960/08/24 Married / Language: English / Race: White Female Date of Admission:  03/18/2015 CC:  Left Hip Pain History of Present Illness The patient is a 54 year old female who comes in for a preoperative History and Physical. The patient is scheduled for a left total hip arthroplasty (anterior) to be performed by Dr. Gus Rankin. Aluisio, MD at [facility] on 03-18-2015. The patient is being followed for their left hip pain. They are 1 year(s) out. Symptoms reported include: pain. The patient feels that they are doing poorly and report their pain level to be 3 / 10. The following medication has been used for pain control: ibuprofen (800 mg). The patient has reported improvement of their symptoms with: Cortisone injections, ice, NSAIDs and rest. Unfortunately, hip is getting a lot worse. She has pain laterally and in the groin. It is occurring day and night. It is limiting what she can and cannot do. Unfortunately, her husband is hospitalized and is very ill. She is having to do a lot of walking in the hospital and that is making this feeling worse. She is ready to proceed with the hip surgery at this time.  They have been treated conservatively in the past for the above stated problem and despite conservative measures, they continue to have progressive pain and severe functional limitations and dysfunction. They have failed non-operative management including home exercise, medications. It is felt that they would benefit from undergoing total joint replacement. Risks and benefits of the procedure have been discussed with the patient and they elect to proceed with surgery. There are no active contraindications to surgery such as ongoing infection or rapidly progressive neurological disease.  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  Family History Rheumatoid Arthritis mother Diabetes Mellitus mother Cancer mother  Social History Alcohol use Formerly drank alcohol. never consumed alcohol Tobacco use Former smoker. Quit August 28,2013 Current work status working full time Children 2 Marital status Recently Widowed Pain Contract no Illicit drug use no Living situation Drug/Alcohol Rehab (Previously) no Exercise Exercises rarely; does running / walking Number of flights of stairs before winded 2-3 Drug/Alcohol Rehab (Currently) no Post-Surgical Plans Home with family  Medication History Omeprazole (  Capsule DR, Oral) Active. ALPRAZolam (0.5MG  Tablet, Oral) Active. Gabapentin (  Capsule, Oral) Active. Lithium Carbonate (  Capsule, Oral) Active. Multiple Vitamins/Womens (1 Oral) Active. Crestor (  Tablet, Oral) Active. Lisinopril (2.5MG  Tablet, Oral) Active. Ibuprofen (  Tablet, Oral) Active.   Past Surgical History Cesarean Delivery Date: 04/1990. 1 time Hysterectomy Date: 03/1993. complete (non-cancerous) Tubal Ligation Date: 06/1990. Gallbladder Surgery Date: 02/2004. laporoscopic Left Hip Arthroscopy Date: 2008.   Review of Systems General Not Present- Chills, Fatigue, Fever, Memory Loss, Night Sweats, Weight Gain and Weight Loss. Skin Not Present- Eczema, Hives, Itching, Lesions and Rash. HEENT Not Present- Dentures, Double Vision, Headache, Hearing Loss, Tinnitus and Visual Loss. Respiratory Not Present- Allergies, Chronic Cough, Coughing up blood, Shortness of breath at rest and Shortness of breath with exertion. Cardiovascular Not Present- Chest Pain, Difficulty Breathing Lying Down, Murmur, Palpitations, Racing/skipping heartbeats and  Swelling. Gastrointestinal Present- Nausea (postop nausea) and Vomiting (psotop vomiting). Not Present- Abdominal Pain, Bloody Stool, Constipation, Diarrhea, Difficulty Swallowing, Heartburn, Jaundice and Loss of appetitie. Female Genitourinary Not Present-  Blood in Urine, Discharge, Flank Pain, Incontinence, Painful Urination, Urgency, Urinary frequency, Urinary Retention, Urinating at Night and Weak urinary stream. Musculoskeletal Present- Joint Swelling. Not Present- Back Pain, Joint Pain, Morning Stiffness, Muscle Pain, Muscle Weakness and Spasms. Neurological Not Present- Blackout spells, Difficulty with balance, Dizziness, Paralysis, Tremor and Weakness. Psychiatric Not Present- Insomnia.   Vitals Weight: 245 lb Height: 67in Body Surface Area: 2.2 m Body Mass Index: 38.37 kg/m  BP: 118/72 (Sitting, Left Arm, Standard)   Physical Exam General Mental Status -Alert, cooperative and good historian. General Appearance-pleasant, Not in acute distress. Orientation-Oriented X3. Build & Nutrition-Well nourished and Well developed.  Head and Neck Head-normocephalic, atraumatic . Neck Global Assessment - supple, no bruit auscultated on the right, no bruit auscultated on the left.  Eye Pupil - Bilateral-Regular and Round. Motion - Bilateral-EOMI.  Chest and Lung Exam Auscultation Breath sounds - clear at anterior chest wall and clear at posterior chest wall. Adventitious sounds - No Adventitious sounds.  Cardiovascular Auscultation Rhythm - Regular rate and rhythm. Heart Sounds - S1 WNL and S2 WNL. Murmurs & Other Heart Sounds - Auscultation of the heart reveals - No Murmurs.  Abdomen Palpation/Percussion Tenderness - Abdomen is non-tender to palpation. Rigidity (guarding) - Abdomen is soft. Auscultation Auscultation of the abdomen reveals - Bowel sounds normal.  Female Genitourinary Note: Not done, not pertinent to present  illness   Musculoskeletal Note: On exam, she is alert and oriented, in no apparent distress. Her left hip can be flexed to 100, minimal internal rotation, about 20 external rotation, 20 abduction. Her right hip has normal range of motion.  RADIOGRAPHS I reviewed the MRI scan of the left hip. She has got areas that are bone on bone. She also has fairly large subchondral cyst in the acetabulum and a small one in the femoral head.  Assessment & Plan  Primary localized osteoarthritis of left hip (M16.12) Note:Surgical Plans: Left Total Hip Replacement - Anterior Approach  Disposition: Home with family  PCP: Karren BurlyAshely Campbell, NP  IV TXA  Anesthesia Issues: Postop Nausea and Vomiting  Signed electronically by Lauraine RinneAlexzandrew L Myer Bohlman, III PA-C

## 2015-03-18 ENCOUNTER — Inpatient Hospital Stay (HOSPITAL_COMMUNITY): Payer: BLUE CROSS/BLUE SHIELD

## 2015-03-18 ENCOUNTER — Encounter (HOSPITAL_COMMUNITY): Payer: Self-pay | Admitting: *Deleted

## 2015-03-18 ENCOUNTER — Inpatient Hospital Stay (HOSPITAL_COMMUNITY)
Admission: RE | Admit: 2015-03-18 | Discharge: 2015-03-19 | DRG: 470 | Disposition: A | Discharge status: Home Health | Payer: BLUE CROSS/BLUE SHIELD | Source: Ambulatory Visit | Attending: Orthopedic Surgery | Admitting: Orthopedic Surgery

## 2015-03-18 ENCOUNTER — Inpatient Hospital Stay (HOSPITAL_COMMUNITY): Payer: BLUE CROSS/BLUE SHIELD | Admitting: Anesthesiology

## 2015-03-18 ENCOUNTER — Encounter (HOSPITAL_COMMUNITY)
Admission: RE | Disposition: A | Discharge status: Home Health | Payer: Self-pay | Source: Ambulatory Visit | Attending: Orthopedic Surgery

## 2015-03-18 DIAGNOSIS — Z6841 Body Mass Index (BMI) 40.0 and over, adult: Secondary | ICD-10-CM

## 2015-03-18 DIAGNOSIS — K219 Gastro-esophageal reflux disease without esophagitis: Secondary | ICD-10-CM | POA: Diagnosis present

## 2015-03-18 DIAGNOSIS — Z01812 Encounter for preprocedural laboratory examination: Secondary | ICD-10-CM

## 2015-03-18 DIAGNOSIS — Z96649 Presence of unspecified artificial hip joint: Secondary | ICD-10-CM

## 2015-03-18 DIAGNOSIS — M1612 Unilateral primary osteoarthritis, left hip: Secondary | ICD-10-CM | POA: Diagnosis present

## 2015-03-18 DIAGNOSIS — M25552 Pain in left hip: Secondary | ICD-10-CM | POA: Diagnosis present

## 2015-03-18 DIAGNOSIS — Z8261 Family history of arthritis: Secondary | ICD-10-CM | POA: Diagnosis not present

## 2015-03-18 DIAGNOSIS — F319 Bipolar disorder, unspecified: Secondary | ICD-10-CM | POA: Diagnosis present

## 2015-03-18 DIAGNOSIS — I10 Essential (primary) hypertension: Secondary | ICD-10-CM | POA: Diagnosis present

## 2015-03-18 DIAGNOSIS — Z79899 Other long term (current) drug therapy: Secondary | ICD-10-CM | POA: Diagnosis not present

## 2015-03-18 DIAGNOSIS — Z87891 Personal history of nicotine dependence: Secondary | ICD-10-CM

## 2015-03-18 DIAGNOSIS — G4730 Sleep apnea, unspecified: Secondary | ICD-10-CM | POA: Diagnosis present

## 2015-03-18 DIAGNOSIS — M169 Osteoarthritis of hip, unspecified: Secondary | ICD-10-CM | POA: Diagnosis present

## 2015-03-18 HISTORY — PX: TOTAL HIP ARTHROPLASTY: SHX124

## 2015-03-18 LAB — TYPE AND SCREEN
ABO/RH(D): A POS
Antibody Screen: NEGATIVE

## 2015-03-18 SURGERY — ARTHROPLASTY, HIP, TOTAL, ANTERIOR APPROACH
Anesthesia: General | Site: Hip | Laterality: Left

## 2015-03-18 MED ORDER — PROPOFOL 10 MG/ML IV BOLUS
INTRAVENOUS | Status: AC
  Filled 2015-03-18: qty 20

## 2015-03-18 MED ORDER — BISACODYL 10 MG RE SUPP: 10.0000 mg | Freq: Every day | RECTAL | Status: DC | PRN

## 2015-03-18 MED ORDER — GLYCOPYRROLATE 0.2 MG/ML IJ SOLN
INTRAMUSCULAR | Status: DC | PRN
  Administered 2015-03-18: 0.6 mg via INTRAVENOUS

## 2015-03-18 MED ORDER — CHLORHEXIDINE GLUCONATE 4 % EX LIQD
60.0000 mL | Freq: Once | CUTANEOUS | Status: DC
Start: 2015-03-18 — End: 2015-03-18

## 2015-03-18 MED ORDER — TRANEXAMIC ACID 1000 MG/10ML IV SOLN
1000.0000 mg | INTRAVENOUS | Status: AC
  Administered 2015-03-18: 1000 mg via INTRAVENOUS
  Filled 2015-03-18: qty 10

## 2015-03-18 MED ORDER — ACETAMINOPHEN 10 MG/ML IV SOLN
1000.0000 mg | Freq: Once | INTRAVENOUS | Status: AC
  Administered 2015-03-18: 1000 mg via INTRAVENOUS

## 2015-03-18 MED ORDER — ACETAMINOPHEN 500 MG PO TABS
1000.0000 mg | ORAL_TABLET | Freq: Four times a day (QID) | ORAL | Status: AC
  Administered 2015-03-18 – 2015-03-19 (×4): 1000 mg via ORAL
  Filled 2015-03-18 (×4): qty 2

## 2015-03-18 MED ORDER — ATROPINE SULFATE 0.4 MG/ML IJ SOLN
INTRAMUSCULAR | Status: DC | PRN
  Administered 2015-03-18: 0.4 mg via INTRAVENOUS

## 2015-03-18 MED ORDER — PROPOFOL 10 MG/ML IV BOLUS
INTRAVENOUS | Status: DC | PRN
  Administered 2015-03-18: 170 mg via INTRAVENOUS

## 2015-03-18 MED ORDER — DIPHENHYDRAMINE HCL 12.5 MG/5ML PO ELIX: 12.5000 mg | ORAL_SOLUTION | ORAL | Status: DC | PRN

## 2015-03-18 MED ORDER — ONDANSETRON HCL 4 MG/2ML IJ SOLN: 4.0000 mg | Freq: Four times a day (QID) | INTRAMUSCULAR | Status: DC | PRN

## 2015-03-18 MED ORDER — LACTATED RINGERS IV SOLN: INTRAVENOUS | Status: DC

## 2015-03-18 MED ORDER — METOCLOPRAMIDE HCL 10 MG PO TABS: 5.0000 mg | ORAL_TABLET | Freq: Three times a day (TID) | ORAL | Status: DC | PRN

## 2015-03-18 MED ORDER — HYDROMORPHONE HCL 1 MG/ML IJ SOLN
INTRAMUSCULAR | Status: AC
  Filled 2015-03-18: qty 1

## 2015-03-18 MED ORDER — ROSUVASTATIN CALCIUM 5 MG PO TABS
5.0000 mg | ORAL_TABLET | Freq: Every day | ORAL | Status: DC
  Administered 2015-03-18: 5 mg via ORAL
  Filled 2015-03-18 (×2): qty 1

## 2015-03-18 MED ORDER — MENTHOL 3 MG MT LOZG: 1.0000 | LOZENGE | OROMUCOSAL | Status: DC | PRN

## 2015-03-18 MED ORDER — SODIUM CHLORIDE 0.9 % IV SOLN: INTRAVENOUS | Status: DC

## 2015-03-18 MED ORDER — METHOCARBAMOL 1000 MG/10ML IJ SOLN
500.0000 mg | Freq: Four times a day (QID) | INTRAVENOUS | Status: DC | PRN
  Administered 2015-03-18: 500 mg via INTRAVENOUS
  Filled 2015-03-18 (×2): qty 5

## 2015-03-18 MED ORDER — ACETAMINOPHEN 10 MG/ML IV SOLN
INTRAVENOUS | Status: AC
  Filled 2015-03-18: qty 100

## 2015-03-18 MED ORDER — SUCCINYLCHOLINE CHLORIDE 20 MG/ML IJ SOLN
INTRAMUSCULAR | Status: DC | PRN
  Administered 2015-03-18: 100 mg via INTRAVENOUS

## 2015-03-18 MED ORDER — LACTATED RINGERS IV SOLN
INTRAVENOUS | Status: DC | PRN
  Administered 2015-03-18 (×2): via INTRAVENOUS

## 2015-03-18 MED ORDER — FLEET ENEMA 7-19 GM/118ML RE ENEM: 1.0000 | ENEMA | Freq: Once | RECTAL | Status: DC | PRN

## 2015-03-18 MED ORDER — GLYCOPYRROLATE 0.2 MG/ML IJ SOLN
INTRAMUSCULAR | Status: AC
  Filled 2015-03-18: qty 3

## 2015-03-18 MED ORDER — PANTOPRAZOLE SODIUM 40 MG PO TBEC
40.0000 mg | DELAYED_RELEASE_TABLET | Freq: Every day | ORAL | Status: DC
  Administered 2015-03-19: 40 mg via ORAL
  Filled 2015-03-18: qty 1

## 2015-03-18 MED ORDER — ONDANSETRON HCL 4 MG/2ML IJ SOLN
INTRAMUSCULAR | Status: AC
  Filled 2015-03-18: qty 2

## 2015-03-18 MED ORDER — SODIUM CHLORIDE 0.9 % IJ SOLN
INTRAMUSCULAR | Status: AC
  Filled 2015-03-18: qty 10

## 2015-03-18 MED ORDER — MIDAZOLAM HCL 2 MG/2ML IJ SOLN
INTRAMUSCULAR | Status: AC
  Filled 2015-03-18: qty 4

## 2015-03-18 MED ORDER — SCOPOLAMINE 1 MG/3DAYS TD PT72
MEDICATED_PATCH | TRANSDERMAL | Status: AC
  Filled 2015-03-18: qty 1

## 2015-03-18 MED ORDER — SCOPOLAMINE 1 MG/3DAYS TD PT72
MEDICATED_PATCH | TRANSDERMAL | Status: DC | PRN
  Administered 2015-03-18: 1 via TRANSDERMAL

## 2015-03-18 MED ORDER — DOCUSATE SODIUM 100 MG PO CAPS
100.0000 mg | ORAL_CAPSULE | Freq: Two times a day (BID) | ORAL | Status: DC
Start: 2015-03-18 — End: 2015-03-19
  Administered 2015-03-18 – 2015-03-19 (×2): 100 mg via ORAL

## 2015-03-18 MED ORDER — CEFAZOLIN SODIUM-DEXTROSE 2-3 GM-% IV SOLR
INTRAVENOUS | Status: AC
  Filled 2015-03-18: qty 50

## 2015-03-18 MED ORDER — EPHEDRINE SULFATE 50 MG/ML IJ SOLN
INTRAMUSCULAR | Status: AC
  Filled 2015-03-18: qty 1

## 2015-03-18 MED ORDER — POLYETHYLENE GLYCOL 3350 17 G PO PACK: 17.0000 g | PACK | Freq: Every day | ORAL | Status: DC | PRN

## 2015-03-18 MED ORDER — ACETAMINOPHEN 325 MG PO TABS: 650.0000 mg | ORAL_TABLET | Freq: Four times a day (QID) | ORAL | Status: DC | PRN

## 2015-03-18 MED ORDER — ONDANSETRON HCL 4 MG PO TABS: 4.0000 mg | ORAL_TABLET | Freq: Four times a day (QID) | ORAL | Status: DC | PRN

## 2015-03-18 MED ORDER — LITHIUM CARBONATE 300 MG PO CAPS
600.0000 mg | ORAL_CAPSULE | Freq: Two times a day (BID) | ORAL | Status: DC
  Administered 2015-03-18 – 2015-03-19 (×2): 600 mg via ORAL
  Filled 2015-03-18 (×3): qty 2

## 2015-03-18 MED ORDER — PHENOL 1.4 % MT LIQD: 1.0000 | OROMUCOSAL | Status: DC | PRN

## 2015-03-18 MED ORDER — CEFAZOLIN SODIUM-DEXTROSE 2-3 GM-% IV SOLR
2.0000 g | INTRAVENOUS | Status: AC
  Administered 2015-03-18: 2 g via INTRAVENOUS

## 2015-03-18 MED ORDER — TRAMADOL HCL 50 MG PO TABS
50.0000 mg | ORAL_TABLET | Freq: Four times a day (QID) | ORAL | Status: DC | PRN
  Administered 2015-03-19: 50 mg via ORAL
  Filled 2015-03-18: qty 1

## 2015-03-18 MED ORDER — DEXAMETHASONE SODIUM PHOSPHATE 10 MG/ML IJ SOLN
INTRAMUSCULAR | Status: AC
  Filled 2015-03-18: qty 1

## 2015-03-18 MED ORDER — ACETAMINOPHEN 650 MG RE SUPP: 650.0000 mg | Freq: Four times a day (QID) | RECTAL | Status: DC | PRN

## 2015-03-18 MED ORDER — HYDROMORPHONE HCL 2 MG PO TABS
2.0000 mg | ORAL_TABLET | ORAL | Status: DC | PRN
  Administered 2015-03-18 – 2015-03-19 (×6): 2 mg via ORAL
  Filled 2015-03-18 (×6): qty 1

## 2015-03-18 MED ORDER — CEFAZOLIN SODIUM-DEXTROSE 2-3 GM-% IV SOLR
2.0000 g | Freq: Four times a day (QID) | INTRAVENOUS | Status: AC
  Administered 2015-03-18 (×2): 2 g via INTRAVENOUS
  Filled 2015-03-18 (×2): qty 50

## 2015-03-18 MED ORDER — CISATRACURIUM BESYLATE (PF) 10 MG/5ML IV SOLN
INTRAVENOUS | Status: DC | PRN
  Administered 2015-03-18: 10 mg via INTRAVENOUS

## 2015-03-18 MED ORDER — RIVAROXABAN 10 MG PO TABS
10.0000 mg | ORAL_TABLET | Freq: Every day | ORAL | Status: DC
  Administered 2015-03-19: 10 mg via ORAL
  Filled 2015-03-18 (×2): qty 1

## 2015-03-18 MED ORDER — PROMETHAZINE HCL 25 MG/ML IJ SOLN
INTRAMUSCULAR | Status: AC
  Administered 2015-03-18: 12.5 mg
  Filled 2015-03-18: qty 1

## 2015-03-18 MED ORDER — SUFENTANIL CITRATE 50 MCG/ML IV SOLN
INTRAVENOUS | Status: DC | PRN
  Administered 2015-03-18: 5 ug via INTRAVENOUS
  Administered 2015-03-18: 20 ug via INTRAVENOUS
  Administered 2015-03-18 (×2): 10 ug via INTRAVENOUS
  Administered 2015-03-18: 5 ug via INTRAVENOUS

## 2015-03-18 MED ORDER — METOCLOPRAMIDE HCL 5 MG/ML IJ SOLN: 5.0000 mg | Freq: Three times a day (TID) | INTRAMUSCULAR | Status: DC | PRN

## 2015-03-18 MED ORDER — DEXAMETHASONE SODIUM PHOSPHATE 10 MG/ML IJ SOLN
10.0000 mg | Freq: Once | INTRAMUSCULAR | Status: AC
  Administered 2015-03-18: 10 mg via INTRAVENOUS

## 2015-03-18 MED ORDER — NEOSTIGMINE METHYLSULFATE 10 MG/10ML IV SOLN
INTRAVENOUS | Status: DC | PRN
  Administered 2015-03-18: 4 mg via INTRAVENOUS

## 2015-03-18 MED ORDER — SUFENTANIL CITRATE 50 MCG/ML IV SOLN
INTRAVENOUS | Status: AC
  Filled 2015-03-18: qty 1

## 2015-03-18 MED ORDER — KETOROLAC TROMETHAMINE 15 MG/ML IJ SOLN
INTRAMUSCULAR | Status: AC
  Filled 2015-03-18: qty 1

## 2015-03-18 MED ORDER — ONDANSETRON HCL 4 MG/2ML IJ SOLN
INTRAMUSCULAR | Status: DC | PRN
  Administered 2015-03-18: 4 mg via INTRAVENOUS

## 2015-03-18 MED ORDER — HYDROMORPHONE HCL 1 MG/ML IJ SOLN
0.2500 mg | INTRAMUSCULAR | Status: DC | PRN
  Administered 2015-03-18 (×3): 0.25 mg via INTRAVENOUS
  Administered 2015-03-18 (×2): 0.5 mg via INTRAVENOUS
  Administered 2015-03-18: 0.25 mg via INTRAVENOUS

## 2015-03-18 MED ORDER — GABAPENTIN 300 MG PO CAPS
600.0000 mg | ORAL_CAPSULE | Freq: Every day | ORAL | Status: DC
  Administered 2015-03-18: 600 mg via ORAL
  Filled 2015-03-18 (×2): qty 2

## 2015-03-18 MED ORDER — HYDROMORPHONE HCL 1 MG/ML IJ SOLN: 0.5000 mg | INTRAMUSCULAR | Status: DC | PRN

## 2015-03-18 MED ORDER — MIDAZOLAM HCL 5 MG/5ML IJ SOLN
INTRAMUSCULAR | Status: DC | PRN
  Administered 2015-03-18: 2 mg via INTRAVENOUS

## 2015-03-18 MED ORDER — METHOCARBAMOL 500 MG PO TABS: 500.0000 mg | ORAL_TABLET | Freq: Four times a day (QID) | ORAL | Status: DC | PRN

## 2015-03-18 MED ORDER — KETOROLAC TROMETHAMINE 15 MG/ML IJ SOLN
7.5000 mg | Freq: Four times a day (QID) | INTRAMUSCULAR | Status: AC | PRN
  Administered 2015-03-18 (×2): 7.5 mg via INTRAVENOUS
  Filled 2015-03-18: qty 1

## 2015-03-18 MED ORDER — ALPRAZOLAM 0.5 MG PO TABS
0.5000 mg | ORAL_TABLET | Freq: Every day | ORAL | Status: DC
  Administered 2015-03-18: 0.5 mg via ORAL
  Filled 2015-03-18: qty 1

## 2015-03-18 MED ORDER — DEXAMETHASONE SODIUM PHOSPHATE 10 MG/ML IJ SOLN
10.0000 mg | Freq: Once | INTRAMUSCULAR | Status: AC
  Administered 2015-03-19: 10 mg via INTRAVENOUS
  Filled 2015-03-18: qty 1

## 2015-03-18 MED ORDER — NEOSTIGMINE METHYLSULFATE 10 MG/10ML IV SOLN
INTRAVENOUS | Status: AC
  Filled 2015-03-18: qty 1

## 2015-03-18 SURGICAL SUPPLY — 42 items
BAG DECANTER FOR FLEXI CONT (MISCELLANEOUS) ×2 IMPLANT
BAG ZIPLOCK 12X15 (MISCELLANEOUS) ×2 IMPLANT
BLADE EXTENDED COATED 6.5IN (ELECTRODE) ×2 IMPLANT
BLADE SAG 18X100X1.27 (BLADE) ×2 IMPLANT
CAPT HIP TOTAL 2 ×2 IMPLANT
COVER PERINEAL POST (MISCELLANEOUS) ×2 IMPLANT
DECANTER SPIKE VIAL GLASS SM (MISCELLANEOUS) ×2 IMPLANT
DRAPE C-ARM 42X120 X-RAY (DRAPES) ×2 IMPLANT
DRAPE STERI IOBAN 125X83 (DRAPES) ×2 IMPLANT
DRAPE U-SHAPE 47X51 STRL (DRAPES) ×6 IMPLANT
DRSG ADAPTIC 3X8 NADH LF (GAUZE/BANDAGES/DRESSINGS) ×2 IMPLANT
DRSG MEPILEX BORDER 4X4 (GAUZE/BANDAGES/DRESSINGS) ×2 IMPLANT
DRSG MEPILEX BORDER 4X8 (GAUZE/BANDAGES/DRESSINGS) ×2 IMPLANT
DURAPREP 26ML APPLICATOR (WOUND CARE) ×2 IMPLANT
ELECT REM PT RETURN 9FT ADLT (ELECTROSURGICAL) ×2
ELECTRODE REM PT RTRN 9FT ADLT (ELECTROSURGICAL) ×1 IMPLANT
EVACUATOR 1/8 PVC DRAIN (DRAIN) ×2 IMPLANT
FACESHIELD WRAPAROUND (MASK) ×8 IMPLANT
GLOVE BIO SURGEON STRL SZ7.5 (GLOVE) ×2 IMPLANT
GLOVE BIO SURGEON STRL SZ8 (GLOVE) ×4 IMPLANT
GLOVE BIOGEL PI IND STRL 8 (GLOVE) ×2 IMPLANT
GLOVE BIOGEL PI INDICATOR 8 (GLOVE) ×2
GOWN STRL REUS W/TWL LRG LVL3 (GOWN DISPOSABLE) ×2 IMPLANT
GOWN STRL REUS W/TWL XL LVL3 (GOWN DISPOSABLE) ×2 IMPLANT
KIT BASIN OR (CUSTOM PROCEDURE TRAY) ×2 IMPLANT
NDL SAFETY ECLIPSE 18X1.5 (NEEDLE) ×1 IMPLANT
NEEDLE HYPO 18GX1.5 SHARP (NEEDLE) ×1
PACK TOTAL JOINT (CUSTOM PROCEDURE TRAY) ×2 IMPLANT
PEN SKIN MARKING BROAD (MISCELLANEOUS) ×2 IMPLANT
STRIP CLOSURE SKIN 1/2X4 (GAUZE/BANDAGES/DRESSINGS) ×2 IMPLANT
SUT ETHIBOND NAB CT1 #1 30IN (SUTURE) ×2 IMPLANT
SUT MNCRL AB 4-0 PS2 18 (SUTURE) ×2 IMPLANT
SUT VIC AB 2-0 CT1 27 (SUTURE) ×2
SUT VIC AB 2-0 CT1 TAPERPNT 27 (SUTURE) ×2 IMPLANT
SUT VLOC 180 0 24IN GS25 (SUTURE) ×2 IMPLANT
SYR 30ML LL (SYRINGE) ×2 IMPLANT
SYR 50ML LL SCALE MARK (SYRINGE) IMPLANT
TOWEL OR 17X26 10 PK STRL BLUE (TOWEL DISPOSABLE) ×2 IMPLANT
TOWEL OR NON WOVEN STRL DISP B (DISPOSABLE) ×2 IMPLANT
TRAY FOLEY W/METER SILVER 14FR (SET/KITS/TRAYS/PACK) ×2 IMPLANT
TRAY FOLEY W/METER SILVER 16FR (SET/KITS/TRAYS/PACK) IMPLANT
YANKAUER SUCT BULB TIP 10FT TU (MISCELLANEOUS) IMPLANT

## 2015-03-18 NOTE — Transfer of Care (Signed)
Immediate Anesthesia Transfer of Care Note  Patient: Dana GaussDenise G Level  Procedure(s) Performed: Procedure(s): LEFT TOTAL HIP ARTHROPLASTY ANTERIOR APPROACH (Left)  Patient Location: PACU  Anesthesia Type:General  Level of Consciousness: awake  Airway & Oxygen Therapy: Patient Spontanous Breathing and Patient connected to face mask oxygen  Post-op Assessment: Report given to RN and Post -op Vital signs reviewed and stable  Post vital signs: Reviewed and stable  Last Vitals:  Filed Vitals:   03/18/15 0859  BP: 130/75  Pulse: 73  Temp: 36.6 C  Resp: 18    Complications: No apparent anesthesia complications

## 2015-03-18 NOTE — Interval H&P Note (Signed)
History and Physical Interval Note:  03/18/2015 10:52 AM  Dana [name] G Ramsay  has presented today for surgery, with the diagnosis of OA OF LEFT HIP  The various methods of treatment have been discussed with the patient and family. After consideration of risks, benefits and other options for treatment, the patient has consented to  Procedure(s): LEFT TOTAL HIP ARTHROPLASTY ANTERIOR APPROACH (Left) as a surgical intervention .  The patient's history has been reviewed, patient examined, no change in status, stable for surgery.  I have reviewed the patient's chart and labs.  Questions were answered to the patient's satisfaction.     Loanne DrillingALUISIO,Eliot Popper V

## 2015-03-18 NOTE — Anesthesia Preprocedure Evaluation (Addendum)
Anesthesia Evaluation  Patient identified by MRN, date of birth, ID band Patient awake    Reviewed: Allergy & Precautions, H&P , NPO status , Patient's Chart, lab work & pertinent test results  History of Anesthesia Complications (+) PONV  Airway Mallampati: III  TM Distance: >3 FB Neck ROM: full    Dental  (+) Dental Advisory Given, Chipped Right upper front chipped:   Pulmonary sleep apnea , former smoker,    Pulmonary exam normal breath sounds clear to auscultation       Cardiovascular hypertension, Pt. on medications Normal cardiovascular exam Rhythm:regular Rate:Normal  palpitations   Neuro/Psych  Headaches, Bipolar Disorder negative neurological ROS  negative psych ROS   GI/Hepatic negative GI ROS, Neg liver ROS, GERD  Medicated and Controlled,  Endo/Other  Morbid obesity  Renal/GU negative Renal ROS  negative genitourinary   Musculoskeletal   Abdominal   Peds  Hematology negative hematology ROS (+)   Anesthesia Other Findings   Reproductive/Obstetrics negative OB ROS                            Anesthesia Physical Anesthesia Plan  ASA: III  Anesthesia Plan: General   Post-op Pain Management:    Induction: Intravenous  Airway Management Planned: Oral ETT  Additional Equipment:   Intra-op Plan:   Post-operative Plan: Extubation in OR  Informed Consent: I have reviewed the patients History and Physical, chart, labs and discussed the procedure including the risks, benefits and alternatives for the proposed anesthesia with the patient or authorized representative who has indicated his/her understanding and acceptance.   Dental Advisory Given  Plan Discussed with: CRNA and Surgeon  Anesthesia Plan Comments:        Anesthesia Quick Evaluation

## 2015-03-18 NOTE — Anesthesia Postprocedure Evaluation (Signed)
  Anesthesia Post-op Note  Patient: Dana GaussDenise G Supak  Procedure(s) Performed: Procedure(s) (LRB): LEFT TOTAL HIP ARTHROPLASTY ANTERIOR APPROACH (Left)  Patient Location: PACU  Anesthesia Type: General  Level of Consciousness: awake and alert   Airway and Oxygen Therapy: Patient Spontanous Breathing  Post-op Pain: mild  Post-op Assessment: Post-op Vital signs reviewed, Patient's Cardiovascular Status Stable, Respiratory Function Stable, Patent Airway and No signs of Nausea or vomiting  Last Vitals:  Filed Vitals:   03/18/15 1345  BP: 130/74  Pulse: 81  Temp: 36.7 C  Resp: 7    Post-op Vital Signs: stable   Complications: No apparent anesthesia complications

## 2015-03-18 NOTE — Evaluation (Signed)
Physical Therapy Evaluation Patient Details Name: Dana [name] MRN: [id number] DOB: 01/20/1961 Today's Date: 03/18/2015   History of Present Illness  L THR  Clinical Impression  Pt s/p L THR presents with decreased L LE strength/ROM and post op pain limiting functional mobility.  Pt should progress to dc home with family assist and HHPT follow up.    Follow Up Recommendations Home health PT    Equipment Recommendations  None recommended by PT    Recommendations for Other Services OT consult     Precautions / Restrictions Precautions Precautions: Fall Restrictions Weight Bearing Restrictions: No Other Position/Activity Restrictions: WBAT      Mobility  Bed Mobility Overal bed mobility: Needs Assistance Bed Mobility: Supine to Sit;Sit to Supine     Supine to sit: Mod assist Sit to supine: Mod assist   General bed mobility comments: cues for sequence and use of R LE to self assist  Transfers Overall transfer level: Needs assistance Equipment used: Rolling walker (2 wheeled) Transfers: Sit to/from Stand Sit to Stand: Min assist;Mod assist         General transfer comment: cues for LE management and use of UEs to self assist  Ambulation/Gait Ambulation/Gait assistance: Min assist;Mod assist Ambulation Distance (Feet): 50 Feet Assistive device: Rolling walker (2 wheeled) Gait Pattern/deviations: Step-to pattern;Decreased step length - right;Decreased step length - left;Shuffle;Trunk flexed Gait velocity: decr Gait velocity interpretation: Below normal speed for age/gender General Gait Details: cues for posture, position from RW and initial sequence  Stairs            Wheelchair Mobility    Modified Rankin (Stroke Patients Only)       Balance                                             Pertinent Vitals/Pain Pain Assessment: 0-10 Pain Score: 4  Pain Location: L hip Pain Descriptors / Indicators: Aching;Sore Pain  Intervention(s): Limited activity within patient's tolerance;Monitored during session;Premedicated before session;Ice applied    Home Living Family/patient expects to be discharged to:: Private residence Living Arrangements: Alone Available Help at Discharge: Family;Available 24 hours/day Type of Home: House Home Access: Stairs to enter Entrance Stairs-Rails: Doctor, general practice of Steps: 4 Home Layout: One level Home Equipment: Environmental consultant - 2 wheels      Prior Function Level of Independence: Independent               Hand Dominance   Dominant Hand: Left    Extremity/Trunk Assessment   Upper Extremity Assessment: Overall WFL for tasks assessed           Lower Extremity Assessment: LLE deficits/detail      Cervical / Trunk Assessment: Normal  Communication   Communication: No difficulties  Cognition Arousal/Alertness: Awake/alert Behavior During Therapy: WFL for tasks assessed/performed Overall Cognitive Status: Within Functional Limits for tasks assessed                      General Comments      Exercises Total Joint Exercises Ankle Circles/Pumps: AROM;Both;15 reps;Supine      Assessment/Plan    PT Assessment Patient needs continued PT services  PT Diagnosis Difficulty walking   PT Problem List Decreased strength;Decreased range of motion;Decreased activity tolerance;Decreased mobility;Decreased knowledge of use of DME;Obesity;Pain  PT Treatment Interventions DME instruction;Gait training;Stair training;Functional mobility training;Therapeutic exercise;Therapeutic activities;Patient/family education  PT Goals (Current goals can be found in the Care Plan section) Acute Rehab PT Goals Patient Stated Goal: Resume previous lifestyle with decreased pain PT Goal Formulation: With patient Time For Goal Achievement: 03/21/15 Potential to Achieve Goals: Good    Frequency 7X/week   Barriers to discharge        Co-evaluation                End of Session Equipment Utilized During Treatment: Gait belt Activity Tolerance: Patient tolerated treatment well;Patient limited by fatigue Patient left: in bed;with call bell/phone within reach;with family/visitor present Nurse Communication: Mobility status         Time: [id number] PT Time Calculation (min) (ACUTE ONLY): 24 min   Charges:   PT Evaluation $Initial PT Evaluation Tier I: 1 Procedure PT Treatments $Gait Training: 8-22 mins   PT G Codes:        Ranon Coven 03/18/2015, 5:22 PM

## 2015-03-18 NOTE — H&P (View-Only) (Signed)
Dana [name] DOB: May 07, 1961 Married / Language: English / Race: White Female Date of Admission:  03/18/2015 CC:  Left Hip Pain History of Present Illness The patient is a 54 year old female who comes in for a preoperative History and Physical. The patient is scheduled for a left total hip arthroplasty (anterior) to be performed by Dr. Gus Rankin. Aluisio, MD at [facility] on 03-18-2015. The patient is being followed for their left hip pain. They are 1 year(s) out. Symptoms reported include: pain. The patient feels that they are doing poorly and report their pain level to be 3 / 10. The following medication has been used for pain control: ibuprofen (800 mg). The patient has reported improvement of their symptoms with: Cortisone injections, ice, NSAIDs and rest. Unfortunately, hip is getting a lot worse. She has pain laterally and in the groin. It is occurring day and night. It is limiting what she can and cannot do. Unfortunately, her husband is hospitalized and is very ill. She is having to do a lot of walking in the hospital and that is making this feeling worse. She is ready to proceed with the hip surgery at this time.  They have been treated conservatively in the past for the above stated problem and despite conservative measures, they continue to have progressive pain and severe functional limitations and dysfunction. They have failed non-operative management including home exercise, medications. It is felt that they would benefit from undergoing total joint replacement. Risks and benefits of the procedure have been discussed with the patient and they elect to proceed with surgery. There are no active contraindications to surgery such as ongoing infection or rapidly progressive neurological disease.  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  Family History Rheumatoid Arthritis mother Diabetes Mellitus mother Cancer mother  Social History Alcohol use Formerly drank alcohol. never consumed alcohol Tobacco use Former smoker. Quit August 28,2013 Current work status working full time Children 2 Marital status Recently Widowed Pain Contract no Illicit drug use no Living situation Drug/Alcohol Rehab (Previously) no Exercise Exercises rarely; does running / walking Number of flights of stairs before winded 2-3 Drug/Alcohol Rehab (Currently) no Post-Surgical Plans Home with family  Medication History Omeprazole (  Capsule DR, Oral) Active. ALPRAZolam (0.5MG  Tablet, Oral) Active. Gabapentin (  Capsule, Oral) Active. Lithium Carbonate (  Capsule, Oral) Active. Multiple Vitamins/Womens (1 Oral) Active. Crestor (  Tablet, Oral) Active. Lisinopril (2.5MG  Tablet, Oral) Active. Ibuprofen (  Tablet, Oral) Active.   Past Surgical History Cesarean Delivery Date: 04/1990. 1 time Hysterectomy Date: 03/1993. complete (non-cancerous) Tubal Ligation Date: 06/1990. Gallbladder Surgery Date: 02/2004. laporoscopic Left Hip Arthroscopy Date: 2008.   Review of Systems General Not Present- Chills, Fatigue, Fever, Memory Loss, Night Sweats, Weight Gain and Weight Loss. Skin Not Present- Eczema, Hives, Itching, Lesions and Rash. HEENT Not Present- Dentures, Double Vision, Headache, Hearing Loss, Tinnitus and Visual Loss. Respiratory Not Present- Allergies, Chronic Cough, Coughing up blood, Shortness of breath at rest and Shortness of breath with exertion. Cardiovascular Not Present- Chest Pain, Difficulty Breathing Lying Down, Murmur, Palpitations, Racing/skipping heartbeats and  Swelling. Gastrointestinal Present- Nausea (postop nausea) and Vomiting (psotop vomiting). Not Present- Abdominal Pain, Bloody Stool, Constipation, Diarrhea, Difficulty Swallowing, Heartburn, Jaundice and Loss of appetitie. Female Genitourinary Not Present-  Blood in Urine, Discharge, Flank Pain, Incontinence, Painful Urination, Urgency, Urinary frequency, Urinary Retention, Urinating at Night and Weak urinary stream. Musculoskeletal Present- Joint Swelling. Not Present- Back Pain, Joint Pain, Morning Stiffness, Muscle Pain, Muscle Weakness and Spasms. Neurological Not Present- Blackout spells, Difficulty with balance, Dizziness, Paralysis, Tremor and Weakness. Psychiatric Not Present- Insomnia.   Vitals Weight: 245 lb Height: 67in Body Surface Area: 2.2 m Body Mass Index: 38.37 kg/m  BP: 118/72 (Sitting, Left Arm, Standard)   Physical Exam General Mental Status -Alert, cooperative and good historian. General Appearance-pleasant, Not in acute distress. Orientation-Oriented X3. Build & Nutrition-Well nourished and Well developed.  Head and Neck Head-normocephalic, atraumatic . Neck Global Assessment - supple, no bruit auscultated on the right, no bruit auscultated on the left.  Eye Pupil - Bilateral-Regular and Round. Motion - Bilateral-EOMI.  Chest and Lung Exam Auscultation Breath sounds - clear at anterior chest wall and clear at posterior chest wall. Adventitious sounds - No Adventitious sounds.  Cardiovascular Auscultation Rhythm - Regular rate and rhythm. Heart Sounds - S1 WNL and S2 WNL. Murmurs & Other Heart Sounds - Auscultation of the heart reveals - No Murmurs.  Abdomen Palpation/Percussion Tenderness - Abdomen is non-tender to palpation. Rigidity (guarding) - Abdomen is soft. Auscultation Auscultation of the abdomen reveals - Bowel sounds normal.  Female Genitourinary Note: Not done, not pertinent to present  illness   Musculoskeletal Note: On exam, she is alert and oriented, in no apparent distress. Her left hip can be flexed to 100, minimal internal rotation, about 20 external rotation, 20 abduction. Her right hip has normal range of motion.  RADIOGRAPHS I reviewed the MRI scan of the left hip. She has got areas that are bone on bone. She also has fairly large subchondral cyst in the acetabulum and a small one in the femoral head.  Assessment & Plan  Primary localized osteoarthritis of left hip (M16.12) Note:Surgical Plans: Left Total Hip Replacement - Anterior Approach  Disposition: Home with family  PCP: Karren BurlyAshely Campbell, NP  IV TXA  Anesthesia Issues: Postop Nausea and Vomiting  Signed electronically by Lauraine RinneAlexzandrew L Perkins, III PA-C

## 2015-03-18 NOTE — Anesthesia Procedure Notes (Signed)
Procedure Name: Intubation Date/Time: 03/18/2015 11:00 AM Performed by: Leroy LibmanEARDON, Maebell Lyvers L Patient Re-evaluated:Patient Re-evaluated prior to inductionOxygen Delivery Method: Circle system utilized Preoxygenation: Pre-oxygenation with 100% oxygen Intubation Type: IV induction Ventilation: Mask ventilation without difficulty and Oral airway inserted - appropriate to patient size Laryngoscope Size: Hyacinth MeekerMiller and 2 Grade View: Grade I Tube type: Oral Tube size: 7.5 mm Number of attempts: 1 Airway Equipment and Method: Stylet Placement Confirmation: ETT inserted through vocal cords under direct vision,  breath sounds checked- equal and bilateral and positive ETCO2 Secured at: 20 cm Tube secured with: Tape Dental Injury: Teeth and Oropharynx as per pre-operative assessment

## 2015-03-18 NOTE — Op Note (Signed)
OPERATIVE REPORT  PREOPERATIVE DIAGNOSIS: Osteoarthritis of the Left hip.   POSTOPERATIVE DIAGNOSIS: Osteoarthritis of the Left  hip.   PROCEDURE: Left total hip arthroplasty, anterior approach.   SURGEON: Ollen GrossFrank Cressie Betzler, MD   ASSISTANT: Avel Peacerew Perkins, PA-C  ANESTHESIA:  General  ESTIMATED BLOOD LOSS:-450 ml   DRAINS: Hemovac x1.   COMPLICATIONS: None   CONDITION: PACU - hemodynamically stable.   BRIEF CLINICAL NOTE: Dana [name] is a 54 y.o. female who has advanced end-  stage arthritis of her Left  hip with progressively worsening pain and  dysfunction.The patient has failed nonoperative management and presents for  total hip arthroplasty.   PROCEDURE IN DETAIL: After successful administration of spinal  anesthetic, the traction boots [facility] were placed on both  feet and the [facility], boots placed into the leg  holders. The Left hip was then isolated from the perineum with plastic  drapes and prepped and draped in the usual sterile fashion. ASIS and  greater trochanter were marked and a oblique incision was made, starting  at about 1 cm lateral and 2 cm distal to the ASIS and coursing towards  the anterior cortex of the femur. The skin was cut with a 10 blade  through subcutaneous tissue to the level of the fascia overlying the  tensor fascia lata muscle. The fascia was then incised in line with the  incision at the junction of the anterior third and posterior 2/3rd. The  muscle was teased off the fascia and then the interval between the TFL  and the rectus was developed. The Hohmann retractor was then placed at  the top of the femoral neck over the capsule. The vessels overlying the  capsule were cauterized and the fat on top of the capsule was removed.  A Hohmann retractor was then placed anterior underneath the rectus  femoris to give exposure to the entire anterior capsule. A T-shaped  capsulotomy was performed. The  edges were tagged and the femoral head  was identified.       Osteophytes are removed off the superior acetabulum.  The femoral neck was then cut in situ with an oscillating saw. Traction  was then applied to the left lower extremity utilizing the [facility]  traction. The femoral head was then removed. Retractors were placed  around the acetabulum and then circumferential removal of the labrum was  performed. Osteophytes were also removed. Reaming starts at 45 mm to  medialize and  Increased in 2 mm increments to 49 mm. We reamed in  approximately 40 degrees of abduction, 20 degrees anteversion. A 50 mm  pinnacle acetabular shell was then impacted in anatomic position under  fluoroscopic guidance with excellent purchase. We did not need to place  any additional dome screws. A 32 mm neutral + 4 marathon liner was then  placed into the acetabular shell.       The femoral lift was then placed along the lateral aspect of the femur  just distal to the vastus ridge. The leg was  externally rotated and capsule  was stripped off the inferior aspect of the femoral neck down to the  level of the lesser trochanter, this was done with electrocautery. The femur was lifted after this was performed. The  leg was then placed and extended in adducted position to essentially delivering the femur. We also removed the capsule superiorly and the  piriformis from the piriformis  fossa to gain excellent exposure of the  proximal femur. Rongeur was used to remove some cancellous bone to get  into the lateral portion of the proximal femur for placement of the  initial starter reamer. The starter broaches was placed  the starter broach  and was shown to go down the center of the canal. Broaching  with the  Corail system was then performed starting at size 8, coursing  Up to size 11. A size 11 had excellent torsional and rotational  and axial stability. The trial standard offset neck was then placed  with a 32 + 1 trial  head. The hip was then reduced. We confirmed that  the stem was in the canal both on AP and lateral x-rays. It also has excellent sizing. The hip was reduced with outstanding stability through full extension, full external rotation,  and then flexion in adduction internal rotation. AP pelvis was taken  and the leg lengths were measured and found to be exactly equal. Hip  was then dislocated again and the femoral head and neck removed. The  femoral broach was removed. Size 11 Corail stem with a standard offset  neck was then impacted into the femur following native anteversion. Has  excellent purchase in the canal. Excellent torsional and rotational and  axial stability. It is confirmed to be in the canal on AP and lateral  fluoroscopic views. The 32 + 1 ceramic head was placed and the hip  reduced with outstanding stability. Again AP pelvis was taken and it  confirmed that the leg lengths were equal. The wound was then copiously  irrigated with saline solution and the capsule reattached and repaired  with Ethibond suture. The fascia overlying the tensor fascia lata was  then closed with a running #1 V-Loc. Subcu was closed with interrupted  2-0 Vicryl and subcuticular running 4-0 Monocryl. Incision was cleaned  and dried. Steri-Strips and a bulky sterile dressing applied. Hemovac  drain was hooked to suction and then he was awakened and transported to  recovery in stable condition.        Please note that a surgical assistant was a medical necessity for this procedure to perform it in a safe and expeditious manner. Assistant was necessary to provide appropriate retraction of vital neurovascular structures and to prevent femoral fracture and allow for anatomic placement of the prosthesis.  Ollen Gross, M.D.

## 2015-03-19 LAB — BASIC METABOLIC PANEL
ANION GAP: 7 (ref 5–15)
BUN: 12 mg/dL (ref 6–20)
CALCIUM: 9.3 mg/dL (ref 8.9–10.3)
CO2: 25 mmol/L (ref 22–32)
CREATININE: 1.13 mg/dL — AB (ref 0.44–1.00)
Chloride: 110 mmol/L (ref 101–111)
GFR calc Af Amer: >60 mL/min (ref >60)
GFR calc non Af Amer: 55 mL/min — ABNORMAL LOW (ref >60)
GLUCOSE: 132 mg/dL — AB (ref 65–99)
POTASSIUM: 4.5 mmol/L (ref 3.5–5.1)
Sodium: 142 mmol/L (ref 135–145)

## 2015-03-19 LAB — CBC
HEMATOCRIT: 35 % — AB (ref 36.0–46.0)
Hemoglobin: 11.5 g/dL — ABNORMAL LOW (ref 12.0–15.0)
MCH: 29 pg (ref 26.0–34.0)
MCHC: 32.9 g/dL (ref 30.0–36.0)
MCV: 88.4 fL (ref 78.0–100.0)
Platelets: 233 10*3/uL (ref 150–400)
RBC: 3.96 MIL/uL (ref 3.87–5.11)
RDW: 13.6 % (ref 11.5–15.5)
WBC: 15.4 10*3/uL — AB (ref 4.0–10.5)

## 2015-03-19 MED ORDER — HYDROMORPHONE HCL 2 MG PO TABS: 2.0000 mg | ORAL_TABLET | ORAL | Status: DC | PRN

## 2015-03-19 MED ORDER — METHOCARBAMOL 500 MG PO TABS: 500.0000 mg | ORAL_TABLET | Freq: Four times a day (QID) | ORAL | Status: DC | PRN

## 2015-03-19 MED ORDER — TRAMADOL HCL 50 MG PO TABS: 50.0000 mg | ORAL_TABLET | Freq: Four times a day (QID) | ORAL | Status: DC | PRN

## 2015-03-19 MED ORDER — RIVAROXABAN 10 MG PO TABS: 10.0000 mg | ORAL_TABLET | Freq: Every day | ORAL | Status: DC

## 2015-03-19 NOTE — Progress Notes (Signed)
   Subjective: 1 Day Post-Op Procedure(s) (LRB): LEFT TOTAL HIP ARTHROPLASTY ANTERIOR APPROACH (Left) Patient reports pain as mild.   Patient seen in rounds by Dr. Lequita HaltAluisio. Patient is well, and has had no acute complaints or problems Patient is ready to go home later today after therapy  Objective: Vital signs in last 24 hours: Temp:  [97.3 F (36.3 C)-98.4 F (36.9 C)] 98 F (36.7 C) (10/13 0547) Pulse Rate:  [63-97] 63 (10/13 0547) Resp:  [9-18] 15 (10/13 0547) BP: (105-139)/(55-82) 112/66 mmHg (10/13 0547) SpO2:  [96 %-100 %] 98 % (10/13 0547) Weight:  [114.76 kg (253 lb)] 114.76 kg (253 lb) (10/12 0908)  Intake/Output from previous day:  Intake/Output Summary (Last 24 hours) at 03/19/15 0835 Last data filed at 03/19/15 0548  Gross per 24 hour  Intake 3346.25 ml  Output   4180 ml  Net -833.75 ml    Labs:  Recent Labs  03/19/15 0533  HGB 11.5*    Recent Labs  03/19/15 0533  WBC 15.4*  RBC 3.96  HCT 35.0*  PLT 233    Recent Labs  03/19/15 0533  NA 142  K 4.5  CL 110  CO2 25  BUN 12  CREATININE 1.13*  GLUCOSE 132*  CALCIUM 9.3   No results for input(s): LABPT, INR in the last 72 hours.  EXAM: General - Patient is Alert, Appropriate and Oriented Extremity - Neurovascular intact Sensation intact distally Dorsiflexion/Plantar flexion intact Dressing - clean, dry, no drainage Motor Function - intact, moving foot and toes well on exam.  Hemovac pulled  Assessment/Plan: 1 Day Post-Op Procedure(s) (LRB): LEFT TOTAL HIP ARTHROPLASTY ANTERIOR APPROACH (Left) Procedure(s) (LRB): LEFT TOTAL HIP ARTHROPLASTY ANTERIOR APPROACH (Left) Past Medical History  Diagnosis Date  . Hypertension   . Palpitations last 1 month ago  . GERD (gastroesophageal reflux disease)   . Sleep apnea   . Bipolar disorder (HCC)   . Arthritis     oa  . Headache     migraines  . Complication of anesthesia   . PONV (postoperative nausea and vomiting)    Principal  Problem:   OA (osteoarthritis) of hip  Estimated body mass index is 40.85 kg/(m^2) as calculated from the following:   Height as of this encounter: 5\' 6"  (1.676 m).   Weight as of this encounter: 114.76 kg (253 lb). Up with therapy D/C IV fluids Discharge home with home health Diet - Cardiac diet Follow up - in 2 weeks Activity - WBAT Disposition - Home Condition Upon Discharge - Good D/C Meds - See DC Summary DVT Prophylaxis - Xarelto  Avel Peacerew Perkins, PA-C Orthopaedic Surgery 03/19/2015, 8:35 AM

## 2015-03-19 NOTE — Evaluation (Signed)
Occupational Therapy Evaluation Patient Details Name: Dana [name] MRN: [id number] DOB: 1960-07-28 Today's Date: 03/19/2015    History of Present Illness L THR   Clinical Impression   This 54 year old female was admitted for the above surgery.  She will benefit from skilled OT in acute to increase safety and independence with adls/bathroom transfers.  Pt was limited by pain during evaluation.  Goals in acute are for min guard level overall.    Follow Up Recommendations  No OT follow up    Equipment Recommendations   (daughter will get 3:1)    Recommendations for Other Services       Precautions / Restrictions Precautions Precautions: Fall Restrictions Weight Bearing Restrictions: No Other Position/Activity Restrictions: WBAT      Mobility Bed Mobility         Supine to sit: Mod assist Sit to supine: Min assist   General bed mobility comments: assist for LLE and for trunk for getting OOB  Transfers   Equipment used: Rolling walker (2 wheeled) Transfers: Sit to/from Stand Sit to Stand: Min assist         General transfer comment: cues for UE/LE placement; steadying assistance    Balance                                            ADL Overall ADL's : Needs assistance/impaired     Grooming: Wash/dry hands;Wash/dry face;Set up;Sitting   Upper Body Bathing: Set up;Sitting   Lower Body Bathing: Moderate assistance;Sit to/from stand   Upper Body Dressing : Set up;Sitting   Lower Body Dressing: Maximal assistance;Sit to/from stand   Toilet Transfer: Minimal assistance;Ambulation;BSC;RW             General ADL Comments: ambulated to bathroom and completed ADL.  Pt will have 24/7.  She does not have a reacher at home--explained uses.  Also educated about long netted sponge.  Pt's daughter plans to get 3:1.  Educated on using in shower; did not complete shower transfer yet due to pain     Vision     Perception     Praxis       Pertinent Vitals/Pain Pain Score: 6  Pain Location: L hip Pain Descriptors / Indicators: Aching Pain Intervention(s): Limited activity within patient's tolerance;Monitored during session;Premedicated before session;Repositioned;Ice applied     Hand Dominance Left   Extremity/Trunk Assessment Upper Extremity Assessment Upper Extremity Assessment: Overall WFL for tasks assessed           Communication Communication Communication: No difficulties   Cognition Arousal/Alertness: Awake/alert Behavior During Therapy: WFL for tasks assessed/performed Overall Cognitive Status: Within Functional Limits for tasks assessed                     General Comments       Exercises       Shoulder Instructions      Home Living Family/patient expects to be discharged to:: Private residence Living Arrangements: Alone Available Help at Discharge: Family;Available 24 hours/day Type of Home: House             Bathroom Shower/Tub: Walk-in Human resources officer: Standard     Home Equipment: Environmental consultant - 2 wheels   Additional Comments: daughter will get 3:1 -- sells this at work      Prior Functioning/Environment Level of Independence: Independent  OT Diagnosis: Generalized weakness   OT Problem List: Decreased strength;Decreased activity tolerance;Decreased knowledge of use of DME or AE;Pain   OT Treatment/Interventions: Self-care/ADL training;DME and/or AE instruction;Patient/family education    OT Goals(Current goals can be found in the care plan section) Acute Rehab OT Goals Patient Stated Goal: Resume previous lifestyle with decreased pain OT Goal Formulation: With patient Time For Goal Achievement: 03/26/15 Potential to Achieve Goals: Good ADL Goals Pt Will Perform Grooming: with supervision;standing Pt Will Transfer to Toilet: with min guard assist;ambulating;bedside commode Pt Will Perform Tub/Shower Transfer: with min guard assist;3 in  1 Additional ADL Goal #1: pt will verbalize vs demonstrate use of reacher for adls  OT Frequency: Min 2X/week   Barriers to D/C:            Co-evaluation              End of Session    Activity Tolerance: Patient limited by pain Patient left: in bed;with call bell/phone within reach;with family/visitor present   Time: 0733-0808 OT Time Calculation (min): 35 min Charges:  OT General Charges $OT Visit: 1 Procedure OT Evaluation $Initial OT Evaluation Tier I: 1 Procedure OT Treatments $Self Care/Home Management : 8-22 mins G-Codes:    Kapil Petropoulos 03/19/2015, 9:02 AM  Marica OtterMaryellen Rindy Kollman, OTR/L [phone] 03/19/2015

## 2015-03-19 NOTE — Care Management Note (Addendum)
Case Management Note  Patient Details  Name: Dana [name] MRN: [id number] Date of Birth: Mar 23, 1961  Subjective/Objective:                   LEFT TOTAL HIP ARTHROPLASTY ANTERIOR APPROACH (Left) Action/Plan:  Discharge planning Expected Discharge Date:  03/19/15               Expected Discharge Plan:  [place]  In-House Referral:     Discharge planning Services  CM Consult  Post Acute Care Choice:  Home Health Choice offered to:  Patient  DME Arranged:  3n1, RW DME Agency:  [place]:  PT [place]:  [place]  Status of Service:  Completed, signed off  Medicare Important Message Given:    Date Medicare IM Given:    Medicare IM give by:    Date Additional Medicare IM Given:    Additional Medicare Important Message give by:     If discussed at [place] of Stay Meetings, dates discussed:    Additional Comments: CM met with pt in room to offer choice of home health agency.  Pt chooses [facility] to render HHPT.  Referral given to [employer], Tim.  CM called [facility] DME rep, Merry Proud to please deliver the 3n1 and rolling walker to room.  No other Cm needs were communicated. Dellie Catholic, RN 03/19/2015, 11:15 AM

## 2015-03-19 NOTE — Progress Notes (Signed)
Physical Therapy Treatment Patient Details Name: Dana GaussDenise G [name] MRN: [id number] DOB: 1960/07/15 Today's Date: 03/19/2015    History of Present Illness L THR    PT Comments    Pt making steady progress with mobility.  Reviewed stairs and car transfers with pt and DIL.  Follow Up Recommendations  Home health PT     Equipment Recommendations  None recommended by PT    Recommendations for Other Services OT consult     Precautions / Restrictions Precautions Precautions: Fall Restrictions Weight Bearing Restrictions: No Other Position/Activity Restrictions: WBAT    Mobility  Bed Mobility               General bed mobility comments: Pt declines to attempt back to bed  Transfers Overall transfer level: Needs assistance Equipment used: Rolling walker (2 wheeled) Transfers: Sit to/from Stand Sit to Stand: Min guard         General transfer comment: cues for UE/LE placement  Ambulation/Gait Ambulation/Gait assistance: Min guard;Supervision Ambulation Distance (Feet): 100 Feet (and 30) Assistive device: Rolling walker (2 wheeled) Gait Pattern/deviations: Step-to pattern;Step-through pattern;Decreased step length - right;Decreased step length - left;Shuffle;Trunk flexed Gait velocity: decr   General Gait Details: cues for posture, position from RW and initial sequence   Stairs Stairs: Yes Stairs assistance: Min assist Stair Management: No rails;One rail Right;Step to pattern;Forwards;With cane Number of Stairs: 6 General stair comments: 5 stairs with cane and rail.  Single step bkwd with RW.  Cues for sequence and foot/cane placement  Wheelchair Mobility    Modified Rankin (Stroke Patients Only)       Balance                                    Cognition Arousal/Alertness: Awake/alert Behavior During Therapy: WFL for tasks assessed/performed Overall Cognitive Status: Within Functional Limits for tasks assessed                       Exercises      General Comments        Pertinent Vitals/Pain Pain Assessment: 0-10 Pain Score: 4  Pain Location: L hip Pain Descriptors / Indicators: Aching;Sore Pain Intervention(s): Limited activity within patient's tolerance;Monitored during session;Premedicated before session;Ice applied    Home Living                      Prior Function            PT Goals (current goals can now be found in the care plan section) Acute Rehab PT Goals Patient Stated Goal: Resume previous lifestyle with decreased pain PT Goal Formulation: With patient Time For Goal Achievement: 03/21/15 Potential to Achieve Goals: Good Progress towards PT goals: Progressing toward goals    Frequency  7X/week    PT Plan Current plan remains appropriate    Co-evaluation             End of Session Equipment Utilized During Treatment: Gait belt Activity Tolerance: Patient tolerated treatment well Patient left: in chair;with call bell/phone within reach;with family/visitor present     Time: [id number] PT Time Calculation (min) (ACUTE ONLY): 30 min  Charges:  $Gait Training: 8-22 mins $Therapeutic Activity: 8-22 mins                    G Codes:      Dana [name] 03/19/2015, 4:38 PM

## 2015-03-19 NOTE — Progress Notes (Signed)
   03/19/15 1300  OT Visit Information  Last OT Received On 03/19/15  Assistance Needed +1  History of Present Illness L THR  OT Time Calculation  OT Start Time (ACUTE ONLY) 1239  OT Stop Time (ACUTE ONLY) 1255  OT Time Calculation (min) 16 min  Precautions  Precautions Fall  Pain Assessment  Pain Score 4  Pain Location L hip  Pain Descriptors / Indicators Aching  Pain Intervention(s) Limited activity within patient's tolerance;Monitored during session;Premedicated before session;Repositioned  Cognition  Arousal/Alertness Awake/alert  Behavior During Therapy WFL for tasks assessed/performed  Overall Cognitive Status Within Functional Limits for tasks assessed  ADL  Grooming Min guard;Minimal assistance;Standing  StatisticianToilet Transfer Min guard;Ambulation;BSC;RW  Toileting- DealerClothing Manipulation and Hygiene Min guard;Sit to/from stand  Tub/ Museum/gallery exhibitions officerhower Transfer Walk-in shower;Min guard;Ambulation  General ADL Comments pt had LOB when cleaning off sink.  Educated pt to have help, and keep one hand on walker. Also reviewed safety and guarding with daughter  Bed Mobility  General bed mobility comments oob  Restrictions  Other Position/Activity Restrictions WBAT  Transfers  Equipment used Rolling walker (2 wheeled)  Transfers Sit to/from Stand  Sit to Stand Min guard  General transfer comment cues for UE/LE placement  OT - End of Session  Activity Tolerance Patient tolerated treatment well  Patient left in chair;with call bell/phone within reach  OT Assessment/Plan  OT Frequency (ACUTE ONLY) Min 2X/week  Follow Up Recommendations No OT follow up  OT Equipment (daughter will get 3:1)  OT Goal Progression  Progress towards OT goals Progressing toward goals  OT General Charges  $OT Visit 1 Procedure  OT Treatments  $Self Care/Home Management  8-22 mins  Marica OtterMaryellen Shanikia Kern[employer]le, OTR/L [phone] 03/19/2015

## 2015-03-19 NOTE — Discharge Instructions (Addendum)
° °Dr. Frank Aluisio °Total Joint Specialist °[facility] °[address] °[place], [place] [address] °[phone] ° °ANTERIOR APPROACH TOTAL HIP REPLACEMENT POSTOPERATIVE DIRECTIONS ° ° °Hip Rehabilitation, Guidelines Following Surgery  °The results of a hip operation are greatly improved after range of motion and muscle strengthening exercises. Follow all safety measures which are given to protect your hip. If any of these exercises cause increased pain or swelling in your joint, decrease the amount until you are comfortable again. Then slowly increase the exercises. Call your caregiver if you have problems or questions.  ° °HOME CARE INSTRUCTIONS  °Remove items at home which could result in a fall. This includes throw rugs or furniture in walking pathways.  °· ICE to the affected hip every three hours for 30 minutes at a time and then as needed for pain and swelling.  Continue to use ice on the hip for pain and swelling from surgery. You may notice swelling that will progress down to the foot and ankle.  This is normal after surgery.  Elevate the leg when you are not up walking on it.   °· Continue to use the breathing machine which will help keep your temperature down.  It is common for your temperature to cycle up and down following surgery, especially at night when you are not up moving around and exerting yourself.  The breathing machine keeps your lungs expanded and your temperature down. ° ° °DIET °You may resume your previous home diet once your are discharged from the hospital. ° °DRESSING / WOUND CARE / SHOWERING °You may shower 3 days after surgery, but keep the wounds dry during showering.  You may use an occlusive plastic wrap (Press'n Seal for example), NO SOAKING/SUBMERGING IN THE BATHTUB.  If the bandage gets wet, change with a clean dry gauze.  If the incision gets wet, pat the wound dry with a clean towel. °You may start showering once you are discharged home but do not  submerge the incision under water. Just pat the incision dry and apply a dry gauze dressing on daily. °Change the surgical dressing daily and reapply a dry dressing each time. ° °ACTIVITY °Walk with your walker as instructed. °Use walker as long as suggested by your caregivers. °Avoid periods of inactivity such as sitting longer than an hour when not asleep. This helps prevent blood clots.  °You may resume a sexual relationship in one month or when given the OK by your doctor.  °You may return to work once you are cleared by your doctor.  °Do not drive a car for 6 weeks or until released by you surgeon.  °Do not drive while taking narcotics. ° °WEIGHT BEARING °Weight bearing as tolerated with assist device (walker, cane, etc) as directed, use it as long as suggested by your surgeon or therapist, typically at least 4-6 weeks. ° °POSTOPERATIVE CONSTIPATION PROTOCOL °Constipation - defined medically as fewer than three stools per week and severe constipation as less than one stool per week. ° °One of the most common issues patients have following surgery is constipation.  Even if you have a regular bowel pattern at home, your normal regimen is likely to be disrupted due to multiple reasons following surgery.  Combination of anesthesia, postoperative narcotics, change in appetite and fluid intake all can affect your bowels.  In order to avoid complications following surgery, here are some recommendations in order to help you during your recovery period. ° °Colace (docusate) - Pick up an over-the-counter   form of Colace or another stool softener and take twice a day as long as you are requiring postoperative pain medications.  Take with a full glass of water daily.  If you experience loose stools or diarrhea, hold the colace until you stool forms back up.  If your symptoms do not get better within 1 week or if they get worse, check with your doctor. ° °Dulcolax (bisacodyl) - Pick up over-the-counter and take as directed  by the product packaging as needed to assist with the movement of your bowels.  Take with a full glass of water.  Use this product as needed if not relieved by Colace only.  ° °MiraLax (polyethylene glycol) - Pick up over-the-counter to have on hand.  MiraLax is a solution that will increase the amount of water in your bowels to assist with bowel movements.  Take as directed and can mix with a glass of water, juice, soda, coffee, or tea.  Take if you go more than two days without a movement. °Do not use MiraLax more than once per day. Call your doctor if you are still constipated or irregular after using this medication for 7 days in a row. ° °If you continue to have problems with postoperative constipation, please contact the office for further assistance and recommendations.  If you experience "the worst abdominal pain ever" or develop nausea or vomiting, please contact the office immediatly for further recommendations for treatment. ° °ITCHING ° If you experience itching with your medications, try taking only a single pain pill, or even half a pain pill at a time.  You can also use Benadryl over the counter for itching or also to help with sleep.  ° °TED HOSE STOCKINGS °Wear the elastic stockings on both legs for three weeks following surgery during the day but you may remove then at night for sleeping. ° °MEDICATIONS °See your medication summary on the “After Visit Summary” that the nursing staff will review with you prior to discharge.  You may have some home medications which will be placed on hold until you complete the course of blood thinner medication.  It is important for you to complete the blood thinner medication as prescribed by your surgeon.  Continue your approved medications as instructed at time of discharge. ° °PRECAUTIONS °If you experience chest pain or shortness of breath - call 911 immediately for transfer to the hospital emergency department.  °If you develop a fever greater that 101 F,  purulent drainage from wound, increased redness or drainage from wound, foul odor from the wound/dressing, or calf pain - CONTACT YOUR SURGEON.   °                                                °FOLLOW-UP APPOINTMENTS °Make sure you keep all of your appointments after your operation with your surgeon and caregivers. You should call the office at the above phone number and make an appointment for approximately two weeks after the date of your surgery or on the date instructed by your surgeon outlined in the "After Visit Summary". ° °RANGE OF MOTION AND STRENGTHENING EXERCISES  °These exercises are designed to help you keep full movement of your hip joint. Follow your caregiver's or physical therapist's instructions. Perform all exercises about fifteen times, three times per day or as directed. Exercise both hips, even if you   have had only one joint replacement. These exercises can be done on a training (exercise) mat, on the floor, on a table or on a bed. Use whatever works the best and is most comfortable for you. Use music or television while you are exercising so that the exercises are a pleasant break in your day. This will make your life better with the exercises acting as a break in routine you can look forward to.  °Lying on your back, slowly slide your foot toward your buttocks, raising your knee up off the floor. Then slowly slide your foot back down until your leg is straight again.  °Lying on your back spread your legs as far apart as you can without causing discomfort.  °Lying on your side, raise your upper leg and foot straight up from the floor as far as is comfortable. Slowly lower the leg and repeat.  °Lying on your back, tighten up the muscle in the front of your thigh (quadriceps muscles). You can do this by keeping your leg straight and trying to raise your heel off the floor. This helps strengthen the largest muscle supporting your knee.  °Lying on your back, tighten up the muscles of your  buttocks both with the legs straight and with the knee bent at a comfortable angle while keeping your heel on the floor.  ° °IF YOU ARE TRANSFERRED TO A SKILLED REHAB FACILITY °If the patient is transferred to a skilled rehab facility following release from the hospital, a list of the current medications will be sent to the facility for the patient to continue.  When discharged from the skilled rehab facility, please have the facility set up the patient's Home Health Physical Therapy prior to being released. Also, the skilled facility will be responsible for providing the patient with their medications at time of release from the facility to include their pain medication, the muscle relaxants, and their blood thinner medication. If the patient is still at the rehab facility at time of the two week follow up appointment, the skilled rehab facility will also need to assist the patient in arranging follow up appointment in our office and any transportation needs. ° °MAKE SURE YOU:  °Understand these instructions.  °Get help right away if you are not doing well or get worse.  ° ° °Pick up stool softner and laxative for home use following surgery while on pain medications. °Do not submerge incision under water. °Please use good hand washing techniques while changing dressing each day. °May shower starting three days after surgery. °Please use a clean towel to pat the incision dry following showers. °Continue to use ice for pain and swelling after surgery. °Do not use any lotions or creams on the incision until instructed by your surgeon. ° °Take Xarelto for two and a half more weeks, then discontinue Xarelto. °Once the patient has completed the blood thinner regimen, then take a Baby 81 mg Aspirin daily for three more weeks. ° ° ° °Information on my medicine - XARELTO® (Rivaroxaban) ° °This medication education was reviewed with me or my healthcare representative as part of my discharge preparation.  The pharmacist that  spoke with me during my hospital stay was:  Glogovac,nikola, RPH ° °Why was Xarelto® prescribed for you? °Xarelto® was prescribed for you to reduce the risk of blood clots forming after orthopedic surgery. The medical term for these abnormal blood clots is venous thromboembolism (VTE). ° °What do you need to know about xarelto® ? °Take your Xarelto® ONCE   DAILY at the same time every day. °You may take it either with or without food. ° °If you have difficulty swallowing the tablet whole, you may crush it and mix in applesauce just prior to taking your dose. ° °Take Xarelto® exactly as prescribed by your doctor and DO NOT stop taking Xarelto® without talking to the doctor who prescribed the medication.  Stopping without other VTE prevention medication to take the place of Xarelto® may increase your risk of developing a clot. ° °After discharge, you should have regular check-up appointments with your healthcare provider that is prescribing your Xarelto®.   ° °What do you do if you miss a dose? °If you miss a dose, take it as soon as you remember on the same day then continue your regularly scheduled once daily regimen the next day. Do not take two doses of Xarelto® on the same day.  ° °Important Safety Information °A possible side effect of Xarelto® is bleeding. You should call your healthcare provider right away if you experience any of the following: °? Bleeding from an injury or your nose that does not stop. °? Unusual colored urine (red or dark brown) or unusual colored stools (red or black). °? Unusual bruising for unknown reasons. °? A serious fall or if you hit your head (even if there is no bleeding). ° °Some medicines may interact with Xarelto® and might increase your risk of bleeding while on Xarelto®. To help avoid this, consult your healthcare provider or pharmacist prior to using any new prescription or non-prescription medications, including herbals, vitamins, non-steroidal anti-inflammatory drugs  (NSAIDs) and supplements. ° °This website has more information on Xarelto®: www.xarelto.com. ° ° °

## 2015-03-19 NOTE — Progress Notes (Signed)
Utilization review completed.  

## 2015-03-19 NOTE — Discharge Summary (Signed)
Physician Discharge Summary   Patient ID: Dana [name] MRN: [id number] DOB/AGE: [birth year] 54 y.o.  Admit date: 03/18/2015 Discharge date: 03-19-2015  Primary Diagnosis:  Osteoarthritis of the Left hip.   Admission Diagnoses:  Past Medical History  Diagnosis Date  . Hypertension   . Palpitations last 1 month ago  . GERD (gastroesophageal reflux disease)   . Sleep apnea   . Bipolar disorder ([place])   . Arthritis     oa  . Headache     migraines  . Complication of anesthesia   . PONV (postoperative nausea and vomiting)    Discharge Diagnoses:   Principal Problem:   OA (osteoarthritis) of hip  Estimated body mass index is 40.85 kg/(m^2) as calculated from the following:   Height as of this encounter: $RemoveBeforeD'5\' 6"'[id number]$  (1.676 m).   Weight as of this encounter: 114.76 kg (253 lb).  Procedure(s) (LRB): LEFT TOTAL HIP ARTHROPLASTY ANTERIOR APPROACH (Left)   Consults: None  HPI: Dana [name] is a 54 y.o. female who has advanced end-  stage arthritis of her Left hip with progressively worsening pain and  dysfunction.The patient has failed nonoperative management and presents for  total hip arthroplasty.   Laboratory Data: Admission on 03/18/2015  Component Date Value Ref Range Status  . WBC 03/19/2015 15.4* 4.0 - 10.5 K/uL Final  . RBC 03/19/2015 3.96  3.87 - 5.11 MIL/uL Final  . Hemoglobin 03/19/2015 11.5* 12.0 - 15.0 g/dL Final  . HCT 03/19/2015 35.0* 36.0 - 46.0 % Final  . MCV 03/19/2015 88.4  78.0 - 100.0 fL Final  . MCH 03/19/2015 29.0  26.0 - 34.0 pg Final  . MCHC 03/19/2015 32.9  30.0 - 36.0 g/dL Final  . RDW 03/19/2015 13.6  11.5 - 15.5 % Final  . Platelets 03/19/2015 233  150 - 400 K/uL Final  . Sodium 03/19/2015 142  135 - 145 mmol/L Final  . Potassium 03/19/2015 4.5  3.5 - 5.1 mmol/L Final  . Chloride 03/19/2015 110  101 - 111 mmol/L Final  . CO2 03/19/2015 25  22 - 32 mmol/L Final  . Glucose, Bld 03/19/2015 132* 65 - 99 mg/dL Final  . BUN 03/19/2015 12  6 -  20 mg/dL Final  . Creatinine, Ser 03/19/2015 1.13* 0.44 - 1.00 mg/dL Final  . Calcium 03/19/2015 9.3  8.9 - 10.3 mg/dL Final  . GFR calc non Af Amer 03/19/2015 55* >60 mL/min Final  . GFR calc Af Amer 03/19/2015 >60  >60 mL/min Final   Comment: (NOTE) The eGFR has been calculated using the CKD EPI equation. This calculation has not been validated in all clinical situations. eGFR's persistently <60 mL/min signify possible Chronic Kidney Disease.   Georgiann Hahn gap 03/19/2015 7  5 - 15 Final  Hospital Outpatient Visit on 03/11/2015  Component Date Value Ref Range Status  . MRSA, PCR 03/11/2015 NEGATIVE  NEGATIVE Final  . Staphylococcus aureus 03/11/2015 NEGATIVE  NEGATIVE Final   Comment:        The Xpert SA Assay (FDA approved for NASAL specimens in patients over 13 years of age), is one component of a comprehensive surveillance program.  Test performance has been validated by [facility] for patients greater than or equal to 26 year old. It is not intended to diagnose infection nor to guide or monitor treatment.   Marland Kitchen aPTT 03/11/2015 25  24 - 37 seconds Final  . WBC 03/11/2015 6.8  4.0 - 10.5 K/uL Final  . RBC 03/11/2015 4.73  3.87 -  5.11 MIL/uL Final  . Hemoglobin 03/11/2015 13.6  12.0 - 15.0 g/dL Final  . HCT 03/11/2015 42.2  36.0 - 46.0 % Final  . MCV 03/11/2015 89.2  78.0 - 100.0 fL Final  . MCH 03/11/2015 28.8  26.0 - 34.0 pg Final  . MCHC 03/11/2015 32.2  30.0 - 36.0 g/dL Final  . RDW 03/11/2015 14.0  11.5 - 15.5 % Final  . Platelets 03/11/2015 219  150 - 400 K/uL Final  . Sodium 03/11/2015 142  135 - 145 mmol/L Final  . Potassium 03/11/2015 4.1  3.5 - 5.1 mmol/L Final  . Chloride 03/11/2015 110  101 - 111 mmol/L Final  . CO2 03/11/2015 25  22 - 32 mmol/L Final  . Glucose, Bld 03/11/2015 103* 65 - 99 mg/dL Final  . BUN 03/11/2015 11  6 - 20 mg/dL Final  . Creatinine, Ser 03/11/2015 1.02* 0.44 - 1.00 mg/dL Final  . Calcium 03/11/2015 10.1  8.9 - 10.3 mg/dL Final  . Total  Protein 03/11/2015 7.7  6.5 - 8.1 g/dL Final  . Albumin 03/11/2015 4.5  3.5 - 5.0 g/dL Final  . AST 03/11/2015 27  15 - 41 U/L Final  . ALT 03/11/2015 28  14 - 54 U/L Final  . Alkaline Phosphatase 03/11/2015 83  38 - 126 U/L Final  . Total Bilirubin 03/11/2015 0.4  0.3 - 1.2 mg/dL Final  . GFR calc non Af Amer 03/11/2015 >60  >60 mL/min Final  . GFR calc Af Amer 03/11/2015 >60  >60 mL/min Final   Comment: (NOTE) The eGFR has been calculated using the CKD EPI equation. This calculation has not been validated in all clinical situations. eGFR's persistently <60 mL/min signify possible Chronic Kidney Disease.   . Anion gap 03/11/2015 7  5 - 15 Final  . Prothrombin Time 03/11/2015 12.7  11.6 - 15.2 seconds Final  . INR 03/11/2015 0.93  0.00 - 1.49 Final  . ABO/RH(D) 03/11/2015 A POS   Final  . Antibody Screen 03/11/2015 NEG   Final  . Sample Expiration 03/11/2015 03/21/2015   Final  . Color, Urine 03/11/2015 YELLOW  YELLOW Final  . APPearance 03/11/2015 CLEAR  CLEAR Final  . Specific Gravity, Urine 03/11/2015 1.004* 1.005 - 1.030 Final  . pH 03/11/2015 7.0  5.0 - 8.0 Final  . Glucose, UA 03/11/2015 NEGATIVE  NEGATIVE mg/dL Final  . Hgb urine dipstick 03/11/2015 NEGATIVE  NEGATIVE Final  . Bilirubin Urine 03/11/2015 NEGATIVE  NEGATIVE Final  . Ketones, ur 03/11/2015 NEGATIVE  NEGATIVE mg/dL Final  . Protein, ur 03/11/2015 NEGATIVE  NEGATIVE mg/dL Final  . Urobilinogen, UA 03/11/2015 0.2  0.0 - 1.0 mg/dL Final  . Nitrite 03/11/2015 NEGATIVE  NEGATIVE Final  . Leukocytes, UA 03/11/2015 NEGATIVE  NEGATIVE Final   MICROSCOPIC NOT DONE ON URINES WITH NEGATIVE PROTEIN, BLOOD, LEUKOCYTES, NITRITE, OR GLUCOSE <1000 mg/dL.     X-Rays:Dg Pelvis Portable  03/18/2015  CLINICAL DATA:  Postop from left hip replacement. EXAM: DG C-ARM 1-60 MIN-NO REPORT; PORTABLE PELVIS 1-2 VIEWS COMPARISON:  None. FINDINGS: Left hip prosthesis is seen in appropriate position. No evidence of fracture or dislocation.  Overlying surgical drain noted. No other pelvic bone abnormality identified. Mild to moderate right hip osteoarthritis also noted. IMPRESSION: Expected postop appearance of left hip prosthesis. No evidence fracture or dislocation. Electronically Signed   By: Earle Gell M.D.   On: 03/18/2015 13:50   Dg C-arm 1-60 Min-no Report  03/18/2015  CLINICAL DATA: surgery C-ARM 1-60 MINUTES Fluoroscopy was  utilized by the requesting physician.  No radiographic interpretation.    EKG:No orders found for this or any previous visit.   Hospital Course: Patient was admitted to [facility] and taken to the OR and underwent the above state procedure without complications.  Patient tolerated the procedure well and was later transferred to the recovery room and then to the orthopaedic floor for postoperative care.  They were given PO and IV analgesics for pain control following their surgery.  They were given 24 hours of postoperative antibiotics of  Anti-infectives    Start     Dose/Rate Route Frequency Ordered Stop   03/18/15 1600  ceFAZolin (ANCEF) IVPB 2 g/50 mL premix     2 g 100 mL/hr over 30 Minutes Intravenous Every 6 hours 03/18/15 1428 03/18/15 2227   03/18/15 0858  ceFAZolin (ANCEF) IVPB 2 g/50 mL premix     2 g 100 mL/hr over 30 Minutes Intravenous On call to O.R. 03/18/15 1517 03/18/15 1102     and started on DVT prophylaxis in the form of Xarelto.   PT and OT were ordered for total hip protocol.  The patient was allowed to be WBAT with therapy. Discharge planning was consulted to help with postop disposition and equipment needs.  Patient had a good night on the evening of surgery.  They started to get up OOB with therapy on day one.  Hemovac drain was pulled without difficulty.  Patient was seen in rounds on POD 1 by Dr. Wynelle Link and it was felt that as long as they did well with two sessions of therapy that they would be ready to go home later that same afternoon.  Discharge home with home  health Diet - Cardiac diet Follow up - in 2 weeks Activity - WBAT Disposition - Home Condition Upon Discharge - Good D/C Meds - See DC Summary DVT Prophylaxis - Xarelto  Discharge Instructions    Call MD / Call 911    Complete by:  As directed   If you experience chest pain or shortness of breath, CALL 911 and be transported to the hospital emergency room.  If you develope a fever above 101 F, pus (white drainage) or increased drainage or redness at the wound, or calf pain, call your surgeon's office.     Change dressing    Complete by:  As directed   You may change your dressing dressing daily with sterile 4 x 4 inch gauze dressing and paper tape.  Do not submerge the incision under water.     Constipation Prevention    Complete by:  As directed   Drink plenty of fluids.  Prune juice may be helpful.  You may use a stool softener, such as Colace (over the counter) 100 mg twice a day.  Use MiraLax (over the counter) for constipation as needed.     Diet - low sodium heart healthy    Complete by:  As directed      Discharge instructions    Complete by:  As directed   Pick up stool softner and laxative for home use following surgery while on pain medications. Do not submerge incision under water. May remove the surgical dressing tomorrow, Friday 03/20/2015, and then apple a dry gauze dressing daily. Please use good hand washing techniques while changing dressing each day. May shower starting three days after surgery on Saturday 03/21/2015. Please use a clean towel to pat the incision dry following showers. Continue to use ice for pain and  swelling after surgery. Do not use any lotions or creams on the incision until instructed by your surgeon.  Total Hip Protocol.  Take Xarelto for two and a half more weeks, then discontinue Xarelto. Once the patient has completed the blood thinner regimen, then take a Baby 81 mg Aspirin daily for three more weeks.  Postoperative Constipation  Protocol  Constipation - defined medically as fewer than three stools per week and severe constipation as less than one stool per week.  One of the most common issues patients have following surgery is constipation.  Even if you have a regular bowel pattern at home, your normal regimen is likely to be disrupted due to multiple reasons following surgery.  Combination of anesthesia, postoperative narcotics, change in appetite and fluid intake all can affect your bowels.  In order to avoid complications following surgery, here are some recommendations in order to help you during your recovery period.  Colace (docusate) - Pick up an over-the-counter form of Colace or another stool softener and take twice a day as long as you are requiring postoperative pain medications.  Take with a full glass of water daily.  If you experience loose stools or diarrhea, hold the colace until you stool forms back up.  If your symptoms do not get better within 1 week or if they get worse, check with your doctor.  Dulcolax (bisacodyl) - Pick up over-the-counter and take as directed by the product packaging as needed to assist with the movement of your bowels.  Take with a full glass of water.  Use this product as needed if not relieved by Colace only.   MiraLax (polyethylene glycol) - Pick up over-the-counter to have on hand.  MiraLax is a solution that will increase the amount of water in your bowels to assist with bowel movements.  Take as directed and can mix with a glass of water, juice, soda, coffee, or tea.  Take if you go more than two days without a movement. Do not use MiraLax more than once per day. Call your doctor if you are still constipated or irregular after using this medication for 7 days in a row.  If you continue to have problems with postoperative constipation, please contact the office for further assistance and recommendations.  If you experience "the worst abdominal pain ever" or develop nausea or  vomiting, please contact the office immediatly for further recommendations for treatment.     Do not sit on low chairs, stoools or toilet seats, as it may be difficult to get up from low surfaces    Complete by:  As directed      Driving restrictions    Complete by:  As directed   No driving until released by the physician.     Increase activity slowly as tolerated    Complete by:  As directed      Lifting restrictions    Complete by:  As directed   No lifting until released by the physician.     Patient may shower    Complete by:  As directed   You may shower without a dressing once there is no drainage.  Do not wash over the wound.  If drainage remains, do not shower until drainage stops.     TED hose    Complete by:  As directed   Use stockings (TED hose) for 3 weeks on both leg(s).  You may remove them at night for sleeping.     Weight bearing as tolerated  Complete by:  As directed   Laterality:  left  Extremity:  Lower            Medication List    STOP taking these medications        FISH OIL PO     HYDROcodone-acetaminophen 5-325 MG tablet  Commonly known as:  NORCO/VICODIN     multivitamin with minerals Tabs tablet     VITAMIN D-3 PO      TAKE these medications        acetaminophen 500 MG tablet  Commonly known as:  TYLENOL  Take 1,000 mg by mouth every 6 (six) hours as needed for moderate pain.     ALPRAZolam 0.5 MG tablet  Commonly known as:  XANAX  Take 0.5 mg by mouth at bedtime.     gabapentin 600 MG tablet  Commonly known as:  NEURONTIN  Take 600 mg by mouth at bedtime.     HYDROmorphone 2 MG tablet  Commonly known as:  DILAUDID  Take 1-2 tablets (2-4 mg total) by mouth every 4 (four) hours as needed for moderate pain or severe pain.     lisinopril 2.5 MG tablet  Commonly known as:  PRINIVIL,ZESTRIL  Take 2.5 mg by mouth every morning.     lithium carbonate 300 MG capsule  Take 600 mg by mouth 2 (two) times daily.     methocarbamol 500  MG tablet  Commonly known as:  ROBAXIN  Take 1 tablet (500 mg total) by mouth every 6 (six) hours as needed for muscle spasms.     omeprazole 20 MG capsule  Commonly known as:  PRILOSEC  Take 20 mg by mouth every morning.     rivaroxaban 10 MG Tabs tablet  Commonly known as:  XARELTO  Take 1 tablet (10 mg total) by mouth daily with breakfast. Take Xarelto for two and a half more weeks, then discontinue Xarelto. Once the patient has completed the blood thinner regimen, then take a Baby 81 mg Aspirin daily for three more weeks.     rosuvastatin 5 MG tablet  Commonly known as:  CRESTOR  Take 5 mg by mouth at bedtime.     traMADol 50 MG tablet  Commonly known as:  ULTRAM  Take 1-2 tablets (50-100 mg total) by mouth every 6 (six) hours as needed (mild pain).           Follow-up Information    Follow up with Gearlean Alf, MD. Schedule an appointment as soon as possible for a visit on 03/31/2015.   Specialty:  Orthopedic Surgery   Why:  Call office at [phone] to setup appointment on Tuesday 03/31/2015 with Dr. Wynelle Link.   Contact information:   [address] [place] [address] [phone]       Signed: Arlee Muslim, PA-C Orthopaedic Surgery 03/19/2015, 8:44 AM

## 2015-03-19 NOTE — Progress Notes (Signed)
Physical Therapy Treatment Patient Details Name: Dana GaussDenise G [name] MRN: [id number] DOB: 1960-12-05 Today's Date: 03/19/2015    History of Present Illness L THR    PT Comments    Pt motivated, making steady progress and hopeful for dc this date.  Follow Up Recommendations  Home health PT     Equipment Recommendations  None recommended by PT    Recommendations for Other Services OT consult     Precautions / Restrictions Precautions Precautions: Fall Restrictions Weight Bearing Restrictions: No Other Position/Activity Restrictions: WBAT    Mobility  Bed Mobility Overal bed mobility: Needs Assistance Bed Mobility: Supine to Sit     Supine to sit: Min assist;Mod assist     General bed mobility comments: assist for LLE and for trunk for getting OOB  Transfers Overall transfer level: Needs assistance Equipment used: Rolling walker (2 wheeled) Transfers: Sit to/from Stand Sit to Stand: Min assist         General transfer comment: cues for UE/LE placement; steadying assistance  Ambulation/Gait Ambulation/Gait assistance: Min assist;Min guard Ambulation Distance (Feet): 150 Feet (and 15' to bathroom) Assistive device: Rolling walker (2 wheeled) Gait Pattern/deviations: Step-to pattern;Decreased step length - right;Decreased step length - left;Shuffle;Trunk flexed Gait velocity: decr   General Gait Details: cues for posture, position from RW and initial sequence   Stairs            Wheelchair Mobility    Modified Rankin (Stroke Patients Only)       Balance                                    Cognition Arousal/Alertness: Awake/alert Behavior During Therapy: WFL for tasks assessed/performed Overall Cognitive Status: Within Functional Limits for tasks assessed                      Exercises Total Joint Exercises Ankle Circles/Pumps: AROM;Both;15 reps;Supine Quad Sets: AROM;Supine;10 reps;Both Heel Slides: AAROM;15  reps;Supine;Left Hip ABduction/ADduction: AAROM;Left;15 reps;Supine    General Comments        Pertinent Vitals/Pain Pain Assessment: 0-10 Pain Score: 5  Pain Location: L hip Pain Descriptors / Indicators: Aching;Sore Pain Intervention(s): Limited activity within patient's tolerance;Monitored during session;Premedicated before session;Ice applied    Home Living                      Prior Function            PT Goals (current goals can now be found in the care plan section) Acute Rehab PT Goals Patient Stated Goal: Resume previous lifestyle with decreased pain PT Goal Formulation: With patient Time For Goal Achievement: 03/21/15 Potential to Achieve Goals: Good Progress towards PT goals: Progressing toward goals    Frequency  7X/week    PT Plan Current plan remains appropriate    Co-evaluation             End of Session Equipment Utilized During Treatment: Gait belt Activity Tolerance: Patient tolerated treatment well Patient left: in chair;with call bell/phone within reach     Time: 1058-1140 PT Time Calculation (min) (ACUTE ONLY): 42 min  Charges:  $Gait Training: 8-22 mins $Therapeutic Exercise: 8-22 mins $Therapeutic Activity: 8-22 mins                    G Codes:      Lamyra Malcolm 03/19/2015, 12:51 PM

## 2015-04-21 ENCOUNTER — Other Ambulatory Visit: Payer: Self-pay | Admitting: Gastroenterology

## 2015-05-28 ENCOUNTER — Other Ambulatory Visit: Payer: Self-pay | Admitting: Gastroenterology

## 2015-08-26 ENCOUNTER — Ambulatory Visit (INDEPENDENT_AMBULATORY_CARE_PROVIDER_SITE_OTHER): Payer: BLUE CROSS/BLUE SHIELD | Admitting: Podiatry

## 2015-08-26 ENCOUNTER — Encounter: Payer: Self-pay | Admitting: Podiatry

## 2015-08-26 ENCOUNTER — Ambulatory Visit (INDEPENDENT_AMBULATORY_CARE_PROVIDER_SITE_OTHER): Payer: BLUE CROSS/BLUE SHIELD

## 2015-08-26 VITALS — BP 103/71 | HR 83 | Resp 16

## 2015-08-26 DIAGNOSIS — M79671 Pain in right foot: Secondary | ICD-10-CM

## 2015-08-26 DIAGNOSIS — R600 Localized edema: Secondary | ICD-10-CM

## 2015-08-26 DIAGNOSIS — M722 Plantar fascial fibromatosis: Secondary | ICD-10-CM | POA: Diagnosis not present

## 2015-08-26 MED ORDER — TRIAMCINOLONE ACETONIDE 10 MG/ML IJ SUSP
10.0000 mg | Freq: Once | INTRAMUSCULAR | Status: AC
  Administered 2015-08-26: 10 mg

## 2015-08-26 NOTE — Progress Notes (Signed)
   Subjective:    Patient ID: Dana [name], female    DOB: 1961-03-31, 55 y.o.   MRN: [id number]  HPI  Pt presents with right heel pain x 6 months with swelling, worsening  Review of Systems  Gastrointestinal: Positive for nausea and abdominal distention.  Endocrine: Positive for polydipsia.  Genitourinary: Positive for urgency and frequency.  Neurological: Positive for dizziness, light-headedness and headaches.  All other systems reviewed and are negative.      Objective:   Physical Exam        Assessment & Plan:

## 2015-08-26 NOTE — Patient Instructions (Signed)

## 2015-08-26 NOTE — Progress Notes (Signed)
Subjective:     Patient ID: Dana [name], female   DOB: 05-26-1961, 55 y.o.   MRN: [id number]  HPI patient states I'm a lot of pain in my right heel and it's been present for a while but worse over the last few months. Very recently has started develop some pain in my right lower leg and into my right ankle and I wanted to get that checked also. The pain is worse when I walk and especially bad when I get up in the morning   Review of Systems  All other systems reviewed and are negative.      Objective:   Physical Exam  Constitutional: She is oriented to person, place, and time.  Cardiovascular: Intact distal pulses.   Musculoskeletal: Normal range of motion.  Neurological: She is oriented to person, place, and time.  Skin: Skin is warm.  Nursing note and vitals reviewed.  neurovascular status intact muscle strength was adequate with patient found to have exquisite discomfort in the right plantar heel with +1 to +2 pitting edema with pain also noted when the right calf muscle is palpated. Patient has pain in the right ankle and this seems to be related to the swelling process and she states the swelling and we'll leg is just started in the last week. She has no shortness of breath and is found to have good digital perfusion and is well oriented 3     Assessment:     Lanter fasciitis right with inflammation pain with probability for vein clot which is creating the swelling in her right lower leg    Plan:     H&P and x-ray were reviewed with patient. I injected the right plantar fascia 3 mg Kenalog 5 mg Xylocaine and applied compression. I'm sending for Doppler to rule out clot in leg and she will be seen back in 2 weeks to recheck. Gave her strict instructions if any shortness of breath or other issues should occur she is to go straight to the emergency room   X-ray report indicated spur formation but no indications of stress fracture arthritis

## 2015-08-31 ENCOUNTER — Inpatient Hospital Stay (HOSPITAL_COMMUNITY): Admission: RE | Admit: 2015-08-31 | Payer: BLUE CROSS/BLUE SHIELD | Source: Ambulatory Visit

## 2015-08-31 ENCOUNTER — Telehealth: Payer: Self-pay | Admitting: *Deleted

## 2015-08-31 ENCOUNTER — Ambulatory Visit (HOSPITAL_COMMUNITY)
Admission: RE | Admit: 2015-08-31 | Discharge: 2015-08-31 | Disposition: A | Discharge status: Home / Self Care | Payer: Medicare Other | Source: Ambulatory Visit | Attending: Podiatry | Admitting: Podiatry

## 2015-08-31 DIAGNOSIS — G4730 Sleep apnea, unspecified: Secondary | ICD-10-CM | POA: Diagnosis not present

## 2015-08-31 DIAGNOSIS — I10 Essential (primary) hypertension: Secondary | ICD-10-CM | POA: Insufficient documentation

## 2015-08-31 DIAGNOSIS — R600 Localized edema: Secondary | ICD-10-CM | POA: Diagnosis not present

## 2015-08-31 DIAGNOSIS — K219 Gastro-esophageal reflux disease without esophagitis: Secondary | ICD-10-CM | POA: Diagnosis not present

## 2015-08-31 NOTE — Telephone Encounter (Signed)
Pt states her heel still hurts even after the injection.  I called pt and encouraged her to be consistent with the ice 3-4 times a day for 10-6615minutes each time, and I was going to transfer to schedulers, but pt states she has an appt 09/02/2015.

## 2015-09-02 ENCOUNTER — Ambulatory Visit (INDEPENDENT_AMBULATORY_CARE_PROVIDER_SITE_OTHER): Payer: BLUE CROSS/BLUE SHIELD | Admitting: Podiatry

## 2015-09-02 ENCOUNTER — Encounter: Payer: Self-pay | Admitting: Podiatry

## 2015-09-02 VITALS — BP 120/76 | HR 66 | Resp 16

## 2015-09-02 DIAGNOSIS — M722 Plantar fascial fibromatosis: Secondary | ICD-10-CM

## 2015-09-02 DIAGNOSIS — R600 Localized edema: Secondary | ICD-10-CM

## 2015-09-02 DIAGNOSIS — M8430XA Stress fracture, unspecified site, initial encounter for fracture: Secondary | ICD-10-CM

## 2015-09-02 NOTE — Progress Notes (Signed)
Subjective:     Patient ID: Dana [name], female   DOB: 02/16/1961, 55 y.o.   MRN: [id number]  HPI patient states she still getting a lot of pain and swelling in her foot and it seems like it's hard for her to do any weightbearing   Review of Systems     Objective:   Physical Exam Neurovascular status intact with negative Homans sign noted and continued edema in the right foot and ankle with exquisite discomfort within the bulk of the calcaneus and also the plantar fascial    Assessment:     Doppler was negative the patient continues to experience discomfort and swelling    Plan:     At this time I did applied Unna boot Ace wrap surgical shoe and discussed cast immobilization with the possibility for stress fracture of the calcaneus as part of the swelling issue. Will be seen back to reevaluate

## 2015-09-09 ENCOUNTER — Ambulatory Visit (INDEPENDENT_AMBULATORY_CARE_PROVIDER_SITE_OTHER): Payer: BLUE CROSS/BLUE SHIELD | Admitting: Podiatry

## 2015-09-09 ENCOUNTER — Ambulatory Visit: Payer: BLUE CROSS/BLUE SHIELD | Admitting: Podiatry

## 2015-09-09 ENCOUNTER — Encounter: Payer: Self-pay | Admitting: Podiatry

## 2015-09-09 DIAGNOSIS — R600 Localized edema: Secondary | ICD-10-CM

## 2015-09-09 DIAGNOSIS — M8430XA Stress fracture, unspecified site, initial encounter for fracture: Secondary | ICD-10-CM | POA: Diagnosis not present

## 2015-09-09 DIAGNOSIS — M722 Plantar fascial fibromatosis: Secondary | ICD-10-CM | POA: Diagnosis not present

## 2015-09-09 NOTE — Progress Notes (Signed)
Subjective:     Patient ID: Dana [name], female   DOB: Mar 23, 1961, 55 y.o.   MRN: [id number]  HPI patient states she still having a lot of pain in her right heel that it's within the bulk of the heel bone itself and the plantar heel. Patient's swelling has gone down a lot but she still in a lot of pain   Review of Systems     Objective:   Physical Exam Screw status intact muscle strength adequate with continued exquisite discomfort within the bulk of the calcaneus right with mild plantar pain also noted with significant reduction of edema and no pain in the calf muscle    Assessment:     Probable stress fracture of the right calcaneus with possibility for inflammatory fasciitis contributory    Plan:     Reviewed condition at great length and we will wait several weeks to do x-ray. Today I applied air fracture walker to completely immobilize and instructed her on also doing calf exercises compression with Ace wrap were not wearing the boot and will be seen back 3 weeks or earlier if any issues should occur

## 2015-09-11 ENCOUNTER — Ambulatory Visit: Payer: BLUE CROSS/BLUE SHIELD | Admitting: Podiatry

## 2015-09-30 ENCOUNTER — Ambulatory Visit: Payer: BLUE CROSS/BLUE SHIELD | Admitting: Podiatry

## 2015-10-02 ENCOUNTER — Ambulatory Visit (INDEPENDENT_AMBULATORY_CARE_PROVIDER_SITE_OTHER): Payer: Medicare Other

## 2015-10-02 ENCOUNTER — Encounter: Payer: Self-pay | Admitting: Podiatry

## 2015-10-02 ENCOUNTER — Telehealth: Payer: Self-pay | Admitting: *Deleted

## 2015-10-02 ENCOUNTER — Ambulatory Visit (INDEPENDENT_AMBULATORY_CARE_PROVIDER_SITE_OTHER): Payer: Medicare Other | Admitting: Podiatry

## 2015-10-02 VITALS — BP 124/86 | HR 64 | Resp 12

## 2015-10-02 DIAGNOSIS — M8430XA Stress fracture, unspecified site, initial encounter for fracture: Secondary | ICD-10-CM

## 2015-10-02 DIAGNOSIS — R600 Localized edema: Secondary | ICD-10-CM | POA: Diagnosis not present

## 2015-10-02 DIAGNOSIS — M722 Plantar fascial fibromatosis: Secondary | ICD-10-CM

## 2015-10-02 MED ORDER — TRIAMCINOLONE ACETONIDE 10 MG/ML IJ SUSP
10.0000 mg | Freq: Once | INTRAMUSCULAR | Status: AC
  Administered 2015-10-02: 10 mg

## 2015-10-02 NOTE — Telephone Encounter (Addendum)
Orders in EPIC and given to D. [facility]ows for of MRI pre-cert.

## 2015-10-02 NOTE — Telephone Encounter (Signed)
I'm calling to see what type of insurance you have.  "I have Medicare.  I showed the girl my card.  Starting on Monday, I'll have [employer] as my supplement."  Okay, that is all I needed to know.

## 2015-10-05 ENCOUNTER — Ambulatory Visit: Payer: BLUE CROSS/BLUE SHIELD | Admitting: Podiatry

## 2015-10-05 NOTE — Progress Notes (Signed)
Subjective:     Patient ID: Dana [name], female   DOB: [birth year], 55 y.o.   MRN: [id number]  HPI patient presents with a lot of discomfort plantar aspect of the right foot and also moderate discomfort in the right heel bone itself. I also noted there to be pain in the subtalar joint lateral side   Review of Systems     Objective:   Physical Exam Neurovascular status unchanged with reduced motion of the subtalar joint right and inflammation and pain noted    Assessment:     Acute plantar fasciitis right which is difficult to walk with along with possibility for a coalition or stress fracture of a subtle nature lateral calcaneus    Plan:     Explained condition and she is desperate for some kind of plantar relieves I did do a very careful plantar injections 3 Milligan Kenalog 5 mg Xylocaine and I am sending for an MRI to understand why she continues to have some swelling and pain in the lateral subtalar joint. We will review results and decide what might be necessary to help her

## 2015-10-10 ENCOUNTER — Ambulatory Visit
Admission: RE | Admit: 2015-10-10 | Discharge: 2015-10-10 | Disposition: A | Discharge status: Home / Self Care | Payer: Medicare Other | Source: Ambulatory Visit | Attending: Podiatry | Admitting: Podiatry

## 2015-10-10 DIAGNOSIS — R600 Localized edema: Secondary | ICD-10-CM

## 2015-10-10 DIAGNOSIS — M8430XA Stress fracture, unspecified site, initial encounter for fracture: Secondary | ICD-10-CM

## 2015-10-15 ENCOUNTER — Encounter: Payer: Self-pay | Admitting: Podiatry

## 2015-10-15 ENCOUNTER — Ambulatory Visit (INDEPENDENT_AMBULATORY_CARE_PROVIDER_SITE_OTHER): Payer: Medicare Other | Admitting: Podiatry

## 2015-10-15 DIAGNOSIS — M722 Plantar fascial fibromatosis: Secondary | ICD-10-CM

## 2015-10-15 DIAGNOSIS — T148 Other injury of unspecified body region: Secondary | ICD-10-CM | POA: Diagnosis not present

## 2015-10-15 DIAGNOSIS — T148XXA Other injury of unspecified body region, initial encounter: Secondary | ICD-10-CM

## 2015-10-15 DIAGNOSIS — M79671 Pain in right foot: Secondary | ICD-10-CM

## 2015-10-15 NOTE — Progress Notes (Signed)
Subjective:     Patient ID: Dana [name], female   DOB: 1960/12/03, 55 y.o.   MRN: [id number]  HPI patient states my heel in the outside of my foot has still been bothering me quite a bit with the swelling having reduce some but I'm still having tremendous pain   Review of Systems     Objective:   Physical Exam Neurovascular status intact negative Homans sign noted with patient found to have exquisite discomfort plantar aspect right heel and lateral side of the right foot around the peroneal complex as it comes underneath the lateral fibula. Patient has adequate range of motion but does splint some due to discomfort    Assessment:     Tear of the peroneal tendon right brevis along with inflammatory fasciitis right heel    Plan:     Reviewed condition at great length and I do think this will require correction with probable repair of the peroneal tendon along with release of the plantar fascia endoscopically. I'm referring this patient to Dr. Ardelle AntonWagoner for his evaluation and treatment and she will have this done the very beginning of June at the discretion of Dr. Ardelle AntonWagoner. Today I went ahead and went over the MRI results with patient

## 2015-10-19 ENCOUNTER — Encounter: Payer: Self-pay | Admitting: Podiatry

## 2015-10-19 ENCOUNTER — Ambulatory Visit (INDEPENDENT_AMBULATORY_CARE_PROVIDER_SITE_OTHER): Payer: Medicare Other | Admitting: Podiatry

## 2015-10-19 VITALS — BP 126/76 | HR 74 | Resp 18

## 2015-10-19 DIAGNOSIS — M722 Plantar fascial fibromatosis: Secondary | ICD-10-CM

## 2015-10-19 DIAGNOSIS — T148 Other injury of unspecified body region: Secondary | ICD-10-CM

## 2015-10-19 DIAGNOSIS — T148XXA Other injury of unspecified body region, initial encounter: Secondary | ICD-10-CM

## 2015-10-21 NOTE — Progress Notes (Signed)
Patient ID: Dana [name], female   DOB: [birth year], 55 y.o.   MRN: [id number]  Subjective: 55 year old female presents the office they for surgical consultation referred by Dr. Charlsie Merlesregal for right heel pain as well as peroneal tendon tear. This is been ongoing for the last several months and she has pain with doing walking as well as standing. She has tried changing shoes as well as bracing and offloading as well as anti-inflammatories without any relief. She presents over surgical consultation. No other complaints today no recent injury.  Objective: General: AAO x3, NAD  Dermatological: Skin is warm, dry and supple bilateral. Nails x 10 are well manicured; remaining integument appears unremarkable at this time. There are no open sores, no preulcerative lesions, no rash or signs of infection present.  Vascular: Dorsalis Pedis artery and Posterior Tibial artery pedal pulses are 2/4 bilateral with immedate capillary fill time. Pedal hair growth present. No varicosities and no lower extremity edema present bilateral. There is no pain with calf compression, swelling, warmth, erythema.   Neruologic: Grossly intact via light touch bilateral. Vibratory intact via tuning fork bilateral. Protective threshold with Semmes Wienstein monofilament intact to all pedal sites bilateral. Patellar and Achilles deep tendon reflexes 2+ bilateral. No Babinski or clonus noted bilateral.   Musculoskeletal: There is tenderness on the plantar medial tubercle of the calcaneus at the insertion the plantar fascia. There is no pain on the course of plantar fashion the arch of the foot. There is also quite a bit discomfort on the course appearing all tendons just proximal the fifth metatarsal base and just inferior to lateral malleolus. There is localized edema to this area although minimal. No overlying erythema or increase in warmth. The peroneal tendons appear to be intact. And then T5/5, range of motion intact.  Gait: Unassisted,  Nonantalgic.   Assessment: Plantar fascial tear/fasciitis, peroneal tear.  Plan: -Treatment options discussed including all alternatives, risks, and complications -Etiology of symptoms were discussed -MRI results were discussed the patient. -At this time a discussed surgical invention with the patient which would be endoscopic plantar fascial release and peroneal tendon repair. I discussed her surgical intervention as well as the postoperative course. She states that she has multiple upcoming events over the next 2 months and she does not want to cancel these trips in the events. Because of this she wishes to hold off on surgery. I discussed with her that she is a high risk of further injury and tearing of the tendon and she understands this but wishes to delay surgery. Recommend continue immobilization in a CAM boot for which her he has at home. Follow-up in 4 weeks or sooner if any issues are to arise. Call any worsening of symptoms in the meantime.  Ovid CurdMatthew Dannae [name], DPM     MRI 10/10/15 IMPRESSION: 1. Changes of plantar fasciitis as discussed above. Small focal tear involving the medial bundle near its attachment site. Associated marrow edema in the calcaneus, calcaneal heel spur and flexor digitorum brevis muscle edema. 2. Significant tendinopathy and tenosynovitis involving the peroneal tendons with a long and fairly extensive longitudinal split type tear involving the peroneus brevis tendon. 3. Intact medial and lateral ankle ligaments and tendons.

## 2015-10-22 DIAGNOSIS — M722 Plantar fascial fibromatosis: Secondary | ICD-10-CM | POA: Insufficient documentation

## 2015-10-22 DIAGNOSIS — T148XXA Other injury of unspecified body region, initial encounter: Secondary | ICD-10-CM | POA: Insufficient documentation

## 2015-11-30 ENCOUNTER — Ambulatory Visit (INDEPENDENT_AMBULATORY_CARE_PROVIDER_SITE_OTHER): Payer: Medicare Other | Admitting: Podiatry

## 2015-11-30 ENCOUNTER — Encounter: Payer: Self-pay | Admitting: Podiatry

## 2015-11-30 DIAGNOSIS — T148 Other injury of unspecified body region: Secondary | ICD-10-CM | POA: Diagnosis not present

## 2015-11-30 DIAGNOSIS — M722 Plantar fascial fibromatosis: Secondary | ICD-10-CM

## 2015-11-30 DIAGNOSIS — M25371 Other instability, right ankle: Secondary | ICD-10-CM | POA: Diagnosis not present

## 2015-11-30 DIAGNOSIS — T148XXA Other injury of unspecified body region, initial encounter: Secondary | ICD-10-CM

## 2015-11-30 NOTE — Patient Instructions (Signed)

## 2015-12-01 NOTE — Progress Notes (Signed)
Patient ID: Dana GaussDenise G [name], female   DOB: 1960/07/13, 55 y.o.   MRN: [id number]  Subjective: 55 year old female presents the office they for follow-up evaluation for right heel pain as well as pain to the outside aspect of her ankle. This been ongoing for quite some time. Last wound discussed with her surgical intervention however she had an upcoming trip and she is moving to get a hold off until her activities this summer complete. Should you have schedule surgery at this time for August. She also states that the pain along the heels increasing. She has tried changing her shoes which have helped but she does continue to have pain. No other complaints today no recent injury.  Objective: General: AAO x3, NAD  Dermatological: Skin is warm, dry and supple bilateral. Nails x 10 are well manicured; remaining integument appears unremarkable at this time. There are no open sores, no preulcerative lesions, no rash or signs of infection present.  Vascular: Dorsalis Pedis artery and Posterior Tibial artery pedal pulses are 2/4 bilateral with immedate capillary fill time. Pedal hair growth present. No varicosities and no lower extremity edema present bilateral. There is no pain with calf compression, swelling, warmth, erythema.   Neruologic: Grossly intact via light touch bilateral. Vibratory intact via tuning fork bilateral. Protective threshold with Semmes Wienstein monofilament intact to all pedal sites bilateral. Patellar and Achilles deep tendon reflexes 2+ bilateral. No Babinski or clonus noted bilateral.   Musculoskeletal: There is tenderness on the plantar medial tubercle of the calcaneus at the insertion the plantar fascia. There is no pain on the course of plantar fashion the arch of the foot. There is also tenderness along the course the peroneal tendons posterior, inferior to the lateral malleolus and just proximal to the fifth metatarsal base. There is edema overlying this area without any erythema or  increase in warmth. There is also tenderness on the course the ATFL. There does appear to be a slight increase in anterior drawer test compared to the contralateral extremity. States subjectively states that when she is walking she feels that she rolls her ankle quite a bit.   Gait: Unassisted, Nonantalgic.   Assessment: Plantar fascial tear/fasciitis, peroneal tear; lateral ankle instability  Plan: -Treatment options discussed including all alternatives, risks, and complications -At this time given increase heel pain discussed that another steroid injection. Under sterile conditions a mixture Kenalog and local anesthetic was infiltrated into the area of maximal tenderness about complications. Post injection care was discussed. -I again discussed with her conservative and surgical treatment options. At this point I recommended surgical intervention. Discussed with her endoscopic plantar fascial release, peroneal tendon repair and lateral ankle stabilization -The incision placement as well as the postoperative course was discussed with the patient. I discussed risks of the surgery which include, but not limited to, infection, bleeding, pain, swelling, need for further surgery, delayed or nonhealing, painful or ugly scar, numbness or sensation changes, over/under correction, recurrence, transfer lesions, further deformity, hardware failure, DVT/PE, loss of toe/foot. Patient understands these risks and wishes to proceed with surgery. The surgical consent was reviewed with the patient all 3 pages were signed. No promises or guarantees were given to the outcome of the procedure. All questions were answered to the best of my ability. Before the surgery the patient was encouraged to call the office if there is any further questions. [facility].  Dana [name], DPM    MRI 10/10/15 IMPRESSION: 1.  Changes of plantar fasciitis as discussed above. Small  focal tear involving the medial bundle near its attachment site. Associated marrow edema in the calcaneus, calcaneal heel spur and flexor digitorum brevis muscle edema. 2. Significant tendinopathy and tenosynovitis involving the peroneal tendons with a long and fairly extensive longitudinal split type tear involving the peroneus brevis tendon. 3. Intact medial and lateral ankle ligaments and tendons.

## 2016-01-06 DIAGNOSIS — M25371 Other instability, right ankle: Secondary | ICD-10-CM | POA: Diagnosis not present

## 2016-01-06 DIAGNOSIS — T148 Other injury of unspecified body region: Secondary | ICD-10-CM

## 2016-01-06 DIAGNOSIS — M722 Plantar fascial fibromatosis: Secondary | ICD-10-CM

## 2016-01-07 ENCOUNTER — Telehealth: Payer: Self-pay | Admitting: *Deleted

## 2016-01-07 NOTE — Telephone Encounter (Addendum)
Post op courtesy call-Pt states her sister is taking good care of her, and the numbness wore off just this morning and she is taking her pain medication.  I instructed pt to read the Post op Instruction sheet, and reminded her that although she is in a cast with the knee scooter, when she is using the knee scooter her foot is below her heart, and that can cause swelling and lead to painfulness.  I told pt not to weight bear, dangle or have the foot below her heart more than 63mins/hour, keep cast clean and dry, call with concerns. 02/01/2016-Pt states her cast was removed today and the foot cramped so bad she had to have the driver stop the car.  Pt states she took one pain pill and stretched the leg and it helped a little, but would like a muscle relaxer and more phenergan for the nausea.  Dr. Ardelle Anton ordered Flexeril and phenergan as previously. Informed pt the medications had been ordered. 03/07/2016-Pt states she has received summons to court and needs a letter faxed before 03/09/2016. I told pt Dr. Ardelle Anton would be fine to write the letter. Pt states fax to [facility] [place], Attn: Edson Snowball, fax [phone]. Done. 03/10/2016-Pt states the demerol makes her feel funny and she would like to change to a stronger medication like Vicodin. 03/11/2016-Informed pt Dr. Ardelle Anton would switch to Hydrocodone 5/325mg , but was concerned that her records state she has an allergy.  Pt states that she took hydrocodone when she had trouble with her shoulder. I told her she would need to pick the rx up in the [place], pt requested the rx be mailed to her. Mailed rx.

## 2016-01-15 ENCOUNTER — Encounter: Payer: Self-pay | Admitting: Podiatry

## 2016-01-15 ENCOUNTER — Ambulatory Visit (INDEPENDENT_AMBULATORY_CARE_PROVIDER_SITE_OTHER): Payer: Medicare Other | Admitting: Podiatry

## 2016-01-15 VITALS — BP 112/82 | HR 96 | Temp 97.5°F | Resp 16

## 2016-01-15 DIAGNOSIS — T148 Other injury of unspecified body region: Secondary | ICD-10-CM

## 2016-01-15 DIAGNOSIS — T148XXA Other injury of unspecified body region, initial encounter: Secondary | ICD-10-CM

## 2016-01-15 DIAGNOSIS — M722 Plantar fascial fibromatosis: Secondary | ICD-10-CM

## 2016-01-15 MED ORDER — MEPERIDINE HCL 50 MG PO TABS: 50.0000 mg | ORAL_TABLET | ORAL | 0 refills | Status: DC | PRN

## 2016-01-15 MED ORDER — PROMETHAZINE HCL 25 MG PO TABS: 25.0000 mg | ORAL_TABLET | Freq: Three times a day (TID) | ORAL | 0 refills | Status: DC | PRN

## 2016-01-17 NOTE — Progress Notes (Signed)
Subjective:     Patient ID: Dana [name], female   DOB: Nov 22, 1960, 55 y.o.   MRN: [id number]  HPI patient presents stating I'm doing well and not having any problems or a lot of pain with the cast   Review of Systems     Objective:   Physical Exam I noted vascular status to be good within the digits with all digits perfused and no other pathology with patient using a vehicle that is allowing her to be mobile without putting any pressure on her foot or leg    Assessment:     Doing well post surgery of the right ankle    Plan:     Continue cast usage for 2 more weeks and return at that time or earlier if any issues should occur

## 2016-02-01 ENCOUNTER — Ambulatory Visit (INDEPENDENT_AMBULATORY_CARE_PROVIDER_SITE_OTHER): Payer: Medicare Other | Admitting: Podiatry

## 2016-02-01 DIAGNOSIS — M722 Plantar fascial fibromatosis: Secondary | ICD-10-CM

## 2016-02-01 DIAGNOSIS — Z9889 Other specified postprocedural states: Secondary | ICD-10-CM

## 2016-02-01 DIAGNOSIS — T148XXA Other injury of unspecified body region, initial encounter: Secondary | ICD-10-CM

## 2016-02-01 DIAGNOSIS — T148 Other injury of unspecified body region: Secondary | ICD-10-CM

## 2016-02-01 MED ORDER — MEPERIDINE HCL 50 MG PO TABS: 50.0000 mg | ORAL_TABLET | ORAL | 0 refills | Status: DC | PRN

## 2016-02-01 MED ORDER — PROMETHAZINE HCL 25 MG PO TABS: 25.0000 mg | ORAL_TABLET | Freq: Three times a day (TID) | ORAL | 0 refills | Status: DC | PRN

## 2016-02-01 MED ORDER — CYCLOBENZAPRINE HCL 10 MG PO TABS: ORAL_TABLET | ORAL | 0 refills | Status: DC

## 2016-02-09 NOTE — Progress Notes (Signed)
Subjective: Dana GaussDenise G Cumpian is a 55 y.o. is seen today in office s/p right tendon repair, EPF. She states that she is not having any pain and doing well. She has remained NWB. Denies any systemic complaints such as fevers, chills, nausea, vomiting. No calf pain, chest pain, shortness of breath.   Objective: General: No acute distress, AAOx3  DP/PT pulses palpable 2/4, CRT < 3 sec to all digits.  Protective sensation intact. Motor function intact.  Cast clean, dry, intact. Right foot: Incision is well coapted without any evidence of dehiscence and staples intact. There is no surrounding erythema, ascending cellulitis, fluctuance, crepitus, malodor, drainage/purulence. There is mild edema around the surgical site. There is minimal pain along the surgical site.  No other areas of tenderness to bilateral lower extremities.  No other open lesions or pre-ulcerative lesions.  No pain with calf compression, swelling, warmth, erythema.   Assessment and Plan:  Status post right foot surgery, doing well with no complications   -Treatment options discussed including all alternatives, risks, and complications -Cast removed. Staples removed. Antibiotic ointment was applied followed by a bandage. She is placed into a cam boot today but she should remain nonweightbearing but she didn't start some gentle range of motion exercises to her toes. -Ice/elevation -Pain medication as needed. -Monitor for any clinical signs or symptoms of infection and DVT/PE and directed to call the office immediately should any occur or go to the ER. -Follow-up as scheduled or sooner if any problems arise. In the meantime, encouraged to call the office with any questions, concerns, change in symptoms.   Ovid CurdMatthew Wagoner, DPM

## 2016-02-19 ENCOUNTER — Ambulatory Visit (INDEPENDENT_AMBULATORY_CARE_PROVIDER_SITE_OTHER): Payer: Medicare Other | Admitting: Podiatry

## 2016-02-19 DIAGNOSIS — T148 Other injury of unspecified body region: Secondary | ICD-10-CM

## 2016-02-19 DIAGNOSIS — M722 Plantar fascial fibromatosis: Secondary | ICD-10-CM

## 2016-02-19 DIAGNOSIS — Z9889 Other specified postprocedural states: Secondary | ICD-10-CM

## 2016-02-19 DIAGNOSIS — T148XXA Other injury of unspecified body region, initial encounter: Secondary | ICD-10-CM

## 2016-02-22 ENCOUNTER — Other Ambulatory Visit: Payer: Self-pay

## 2016-02-22 DIAGNOSIS — Z9889 Other specified postprocedural states: Secondary | ICD-10-CM

## 2016-02-22 DIAGNOSIS — T148XXA Other injury of unspecified body region, initial encounter: Secondary | ICD-10-CM

## 2016-02-22 DIAGNOSIS — M722 Plantar fascial fibromatosis: Secondary | ICD-10-CM

## 2016-02-22 DIAGNOSIS — M25371 Other instability, right ankle: Secondary | ICD-10-CM

## 2016-02-22 NOTE — Progress Notes (Signed)
Subjective: Dana GaussDenise G [name] is a 55 y.o. is seen today in office s/p right tendon repair, EPF. She said that she is doing well she's having no pain. She has been walking in the cam boot at home and she states that when she does that she does not have any pain. I did recommend her to be nonweightbearing but she has started to weight-bear in the cam boot at home and with no discomfort. Denies any systemic complaints such as fevers, chills, nausea, vomiting. No calf pain, chest pain, shortness of breath.   Objective: General: No acute distress, AAOx3  DP/PT pulses palpable 2/4, CRT < 3 sec to all digits.  Protective sensation intact. Motor function intact.  Cam boot intact. Right foot: Incision is well coapted without any evidence of dehiscence and half of the staples intact. There is no surrounding erythema, ascending cellulitis, fluctuance, crepitus, malodor, drainage/purulence. There is decreased edema around the surgical site. There is no pain along the surgical site.  No other areas of tenderness to bilateral lower extremities.  No other open lesions or pre-ulcerative lesions.  No pain with calf compression, swelling, warmth, erythema.   Assessment and Plan:  Status post right foot surgery, doing well with no complications   -Treatment options discussed including all alternatives, risks, and complications -Continue with CAM boot at all times. She can start to transition to weightbearing as tolerated. She is ready been doing this we will go ahead and start physical therapy as well as prescriptions provided today. Continue ice and elevation. Pain medication as needed but she has not been taking any. -Remainder the staples were removed today. Continue antibiotic ointment dressing changes. -Follow up as scheduled or sooner if needed. Call any questions or concerns in the meantime.  Ovid CurdMatthew Lelar Farewell, DPM

## 2016-03-04 ENCOUNTER — Ambulatory Visit (INDEPENDENT_AMBULATORY_CARE_PROVIDER_SITE_OTHER): Payer: Medicare Other | Admitting: Podiatry

## 2016-03-04 ENCOUNTER — Encounter: Payer: Self-pay | Admitting: Podiatry

## 2016-03-04 DIAGNOSIS — T148 Other injury of unspecified body region: Secondary | ICD-10-CM

## 2016-03-04 DIAGNOSIS — M722 Plantar fascial fibromatosis: Secondary | ICD-10-CM

## 2016-03-04 DIAGNOSIS — B351 Tinea unguium: Secondary | ICD-10-CM

## 2016-03-04 DIAGNOSIS — T148XXA Other injury of unspecified body region, initial encounter: Secondary | ICD-10-CM

## 2016-03-04 NOTE — Progress Notes (Signed)
Subjective: Dana GaussDenise G [name] is a 55 y.o. is seen today in office s/p right tendon repair, EPF. She said that she is doing well she's having no pain. She states that she is still doing physical therapy as she is doing substantially better than she was the Dana [name] is also improved. She said that she is making good outcomes and it feels much presented prior to surgery. She is wearing a regular shoe. She is not requiring a brace. Denies any systemic complaints such as fevers, chills, nausea, vomiting. No calf pain, chest pain, shortness of breath.   Objective: General: No acute distress, AAOx3  DP/PT pulses palpable 2/4, CRT < 3 sec to all digits.  Protective sensation intact. Motor function intact.  Right foot: Incision is well coapted without any evidence of dehiscence and scar has formed. There is no Dana [name] can tenderness along the surgical site at this time. There is minimal edema to the lateral ankle without any erythema or increase in warmth. There is no area of tenderness at this time. No area pinpoint bony tenderness. Range of motion intact. No other open lesions or pre-ulcerative lesions.  No pain with calf compression, swelling, warmth, erythema.   Assessment and Plan:  Status post right foot surgery, doing well with no complications   -Treatment options discussed including all alternatives, risks, and complications -At this time continue with physical therapy. Continue supportive shoe gear. Ice and elevation as needed. Continue with compression sock. I'll see her back in 6 weeks or sooner if needed. Call any questions or concerns meantime.  Dana [name], DPM

## 2016-03-07 ENCOUNTER — Telehealth: Payer: Self-pay | Admitting: *Deleted

## 2016-03-07 ENCOUNTER — Encounter: Payer: Self-pay | Admitting: *Deleted

## 2016-03-07 NOTE — Telephone Encounter (Signed)
Entered in error

## 2016-03-10 NOTE — Telephone Encounter (Signed)
OK to do vicodin 5/325 1-2 every 4-6 hours as long as she can take it. It says she has a codeine allergy in EPIC. Has she had this before?

## 2016-03-11 MED ORDER — HYDROCODONE-ACETAMINOPHEN 5-325 MG PO TABS: ORAL_TABLET | ORAL | 0 refills | Status: DC

## 2016-04-15 ENCOUNTER — Ambulatory Visit: Payer: Medicare Other | Admitting: Podiatry

## 2016-04-18 ENCOUNTER — Ambulatory Visit (INDEPENDENT_AMBULATORY_CARE_PROVIDER_SITE_OTHER): Payer: Medicare Other | Admitting: Podiatry

## 2016-04-18 DIAGNOSIS — M722 Plantar fascial fibromatosis: Secondary | ICD-10-CM

## 2016-04-18 DIAGNOSIS — T148XXA Other injury of unspecified body region, initial encounter: Secondary | ICD-10-CM

## 2016-04-20 ENCOUNTER — Telehealth: Payer: Self-pay

## 2016-04-20 NOTE — Telephone Encounter (Signed)
Spoke with pt informing her that her orthotic scans were not saved in the computer system, advised her to come in at her convenience to be re-scanned

## 2016-04-21 NOTE — Progress Notes (Signed)
Subjective: Dana GaussDenise G [name] is a 55 y.o. is seen today in office s/p right tendon repair, EPF. She states that she is doing much better compared to prior to surgery. She is able to wear regular shoe. She is going to physical therapy still but she is asking me discharge if she feel that she can do the same thing at home but she is going to do it physical therapy. She still gets some swelling which is intermittent increases if she is on her feet more. However she states that she is doing substantially better to the surgical sites and she has no complaints today. She states that she is very happy with the outcome of the surgery.  Objective: General: No acute distress, AAOx3  DP/PT pulses palpable 2/4, CRT < 3 sec to all digits.  Protective sensation intact. Motor function intact.  Right foot: Incision is well coapted without any evidence of dehiscence and scar has formed. There is no significant tenderness palpation on the surgical sites. There is trace edema to the foot however there is no erythema or increase in warmth. There is no open lesions or pre-ulcerative lesions. Increasing calcaneal inclination angle. There is no other areas of tenderness bilaterally. No pain with calf compression, swelling, warmth, erythema.   Assessment and Plan:  Status post right foot surgery, doing well with no complications   -Treatment options discussed including all alternatives, risks, and complications -She is doing well from the surgery. She is still doing home exercises. We will discontinue physical therapy at her request as she is doing the same exercises at home but she is doing physical therapy. Continue with these rehabilitation exercises. I discussed that he go for 1 more treatment to get a home program. She understood this. -Also this point I recommended orthotics help support her foot type to help prevent any recurrence. This tendon was torn quite significantly she denies any recent injury or trauma to the  area. This could be result of biomechanical changes. Hopefully orthotic to help prevent recurrence. She was scanned for them today there is [facility]. -Follow-up in 3 weeks to pick up orthotics or sooner if needed.  Ovid CurdMatthew Cidney Kirkwood, DPM

## 2016-05-16 ENCOUNTER — Ambulatory Visit (INDEPENDENT_AMBULATORY_CARE_PROVIDER_SITE_OTHER): Payer: Medicare Other | Admitting: Podiatry

## 2016-05-16 DIAGNOSIS — M722 Plantar fascial fibromatosis: Secondary | ICD-10-CM

## 2016-05-16 DIAGNOSIS — T148XXA Other injury of unspecified body region, initial encounter: Secondary | ICD-10-CM

## 2016-05-17 ENCOUNTER — Telehealth: Payer: Self-pay | Admitting: *Deleted

## 2016-05-17 NOTE — Telephone Encounter (Signed)
Called and spoke with gary at [facility] and I stated that the patient was re-scanned yesterday and could we rush to get them back and that I would mail them to patient once I received the inserts. Misty StanleyLisa

## 2016-05-22 NOTE — Progress Notes (Signed)
Subjective: Dana GaussDenise G [name] is a 55 y.o. is seen today in office s/p right tendon repair, EPF. She states that she is doing very well and she is having no pain compared to prior to surgery. She was post presented pickup orthotics however she did not come back to get rescanned as she forgot to come in. She presents today for skin in the orthotics. She is able to wear regular shoe without any problems. She has discontinued formal physical therapy but she does continue with exercises at home. She is been continuing to increase her activity. She has no new complaints today.  Objective: General: No acute distress, AAOx3  DP/PT pulses palpable 2/4, CRT < 3 sec to all digits.  Protective sensation intact. Motor function intact.  Right foot: Incision is well coapted without any evidence of dehiscence and scar has formed. There is no tenderness palpation on the surgical sites. There is no significant edema to the foot however there is no erythema or increase in warmth. There is no open lesions or pre-ulcerative lesions. Increase in the calcaneal inclination angle. There is no other areas of tenderness bilaterally. No pain with calf compression, swelling, warmth, erythema.   Assessment and Plan:  Status post right foot surgery, doing well with no complications   -Treatment options discussed including all alternatives, risks, and complications -Today she was rescanned for orthotics to help prevent recurrence of the tendon/ligament tear. I discussed with her shoe gear modifications and continue with home physical therapy. WILL MAIL HER THE ORTHOTICS. Discussed with her to follow-up with me 4 weeks after the inserts if there are any issues.   Dana CurdMatthew Rien [name], DPM

## 2016-05-25 ENCOUNTER — Telehealth: Payer: Self-pay | Admitting: *Deleted

## 2016-05-25 NOTE — Telephone Encounter (Signed)
Called patient and stated that I was mailing the inserts out and that patient should get them next week. Dana StanleyLisa

## 2016-06-20 ENCOUNTER — Ambulatory Visit: Payer: Medicare Other | Admitting: Podiatry

## 2016-06-23 ENCOUNTER — Ambulatory Visit: Payer: 59 | Admitting: Podiatry

## 2016-06-24 ENCOUNTER — Encounter: Payer: Self-pay | Admitting: Podiatry

## 2016-06-24 ENCOUNTER — Ambulatory Visit (INDEPENDENT_AMBULATORY_CARE_PROVIDER_SITE_OTHER): Payer: Self-pay | Admitting: Podiatry

## 2016-06-24 DIAGNOSIS — T148XXA Other injury of unspecified body region, initial encounter: Secondary | ICD-10-CM

## 2016-06-24 DIAGNOSIS — Z9889 Other specified postprocedural states: Secondary | ICD-10-CM

## 2016-06-24 DIAGNOSIS — M722 Plantar fascial fibromatosis: Secondary | ICD-10-CM

## 2016-06-24 NOTE — Progress Notes (Signed)
Subjective: Dana GaussDenise G [name] is a 56 y.o. is seen today in office s/p right tendon repair, EPF. She states that she is doing well. She has been getting a small amount of pain along the to the surgical site but she is doing well otherwise. She is able to wear regular shoe without any problems. She did get her orthotics but she has not worn them very much. She states they are somewhat long and her shoes but she has not cut them. She did not bring them today. Denies any systemic complaints such as fevers, chills, nausea, vomiting. No calf pain, chest pain, shortness of breath.   Overall she states she is very pleased with the outcome of the surgery. She is also happy about dating someone new, which has surprised her after her husband passed away.   Objective: General: No acute distress, AAOx3  DP/PT pulses palpable 2/4, CRT < 3 sec to all digits.  Protective sensation intact. Motor function intact.  Right foot: Incision is well coapted without any evidence of dehiscence and scar has formed. There is no tenderness along the surgical site today. Upon palpation of the distal portion of the incision there is subjective tenderness at times but not currently. Denies any swelling, redness, warmth.  There is no other areas of tenderness bilaterally. No pain with calf compression, swelling, warmth, erythema.   Assessment and Plan:  Status post right foot surgery, doing well with no complications   -Treatment options discussed including all alternatives, risks, and complications -At this time continue with supportive shoe gear and recommended her to try the orthotics and discussed the break-in period. She did not bring the inserts with her today. If any issues with them to let me know -Also continue stretching/rehab exercises for the lateral ankle. Discussed today -Ice as needed -At this time she is doing well from the surgery. I am going to discharge her from the post-operative care. I discussed with that if  there are any issues to call and let me know and I will be more than happy to see her back.   Ovid CurdMatthew Wagoner, DPM

## 2016-09-30 ENCOUNTER — Encounter: Payer: Self-pay | Admitting: Podiatry

## 2016-09-30 ENCOUNTER — Ambulatory Visit (INDEPENDENT_AMBULATORY_CARE_PROVIDER_SITE_OTHER): Payer: Medicare PPO

## 2016-09-30 ENCOUNTER — Ambulatory Visit (INDEPENDENT_AMBULATORY_CARE_PROVIDER_SITE_OTHER): Payer: Medicare PPO | Admitting: Podiatry

## 2016-09-30 DIAGNOSIS — M25371 Other instability, right ankle: Secondary | ICD-10-CM | POA: Diagnosis not present

## 2016-09-30 DIAGNOSIS — M722 Plantar fascial fibromatosis: Secondary | ICD-10-CM | POA: Diagnosis not present

## 2016-09-30 DIAGNOSIS — M779 Enthesopathy, unspecified: Secondary | ICD-10-CM | POA: Diagnosis not present

## 2016-09-30 MED ORDER — HYDROCODONE-ACETAMINOPHEN 5-325 MG PO TABS: ORAL_TABLET | ORAL | 0 refills | Status: DC

## 2016-09-30 MED ORDER — MELOXICAM 15 MG PO TABS: 15.0000 mg | ORAL_TABLET | Freq: Every day | ORAL | 2 refills | Status: DC

## 2016-10-02 NOTE — Progress Notes (Signed)
Subjective: 56 year old female presents the office today she started have some recurrent pain of the right foot. She states that she's not having the pain on the area where she had surgery which she points more on the lateral aspect of the foot where she started to get pain. She denies any recent injury or trauma she's not change her activity at all. She does leave for [place] tomorrow. She denies any increase in swelling or redness. No numbness or tingling. The pain does not waken her up at night. Denies any systemic complaints such as fevers, chills, nausea, vomiting. No acute changes since last appointment, and no other complaints at this time.   Objective: AAO x3, NAD DP/PT pulses palpable bilaterally, CRT less than 3 seconds There is no tenderness on the scar from the prior surgery however she is getting some pain just distal to this area on the distal portion of the peroneal tendon however the majority of her tenderness appears to be along the lateral aspect of the sinus tarsi. There is no pain with subtalar joint range of motion. There is minimal edema to this area and there is no erythema or increase in warmth. There is no other areas of tenderness identified bilaterally. No open lesions or pre-ulcerative lesions.  No pain with calf compression, swelling, warmth, erythema  Assessment: Right foot capsulitis/sinus tarsi syndrome; tendinitis  Plan: -All treatment options discussed with the patient including all alternatives, risks, complications.  -X-rays were obtained and reviewed. Is no evidence of acute fracture identified. -I discussed a steroid injection she wishes to proceed with this. Under sterile conditions a makeshift Kenalog level and a second was infiltrated into the area of maximal tenderness the lateral aspect of the right foot. Postinjection care was discussed. -Prescribed mobic. Discussed side effects of the medication and directed to stop if any are to occur and call the  office.  -She is inquiring about possible a medication she is going to trip tomorrow to include for 3 weeks. I did prescribe vicodin. Discussed she cannot work or drive while taking this medication she understands this. -Also dispensed ankle brace to wear as needed. -RTC 4 weeks. -Patient encouraged to call the office with any questions, concerns, change in symptoms.   [name], DPM

## 2016-10-28 ENCOUNTER — Ambulatory Visit: Payer: Medicare PPO | Admitting: Podiatry

## 2016-12-15 ENCOUNTER — Ambulatory Visit (INDEPENDENT_AMBULATORY_CARE_PROVIDER_SITE_OTHER): Payer: Medicare PPO

## 2016-12-15 ENCOUNTER — Ambulatory Visit (INDEPENDENT_AMBULATORY_CARE_PROVIDER_SITE_OTHER): Payer: Medicare PPO | Admitting: Podiatry

## 2016-12-15 ENCOUNTER — Encounter: Payer: Self-pay | Admitting: Podiatry

## 2016-12-15 DIAGNOSIS — M779 Enthesopathy, unspecified: Secondary | ICD-10-CM | POA: Diagnosis not present

## 2016-12-15 MED ORDER — METHYLPREDNISOLONE 4 MG PO TBPK: ORAL_TABLET | ORAL | 0 refills | Status: DC

## 2016-12-15 MED ORDER — HYDROCODONE-ACETAMINOPHEN 5-325 MG PO TABS: ORAL_TABLET | ORAL | 0 refills | Status: DC

## 2016-12-18 NOTE — Progress Notes (Signed)
Subjective: 56 year old female presents the office they for follow-up evaluation of right foot pain. She [place] got back from [place] and she states that she is doing a lot of walking and standing. She states that she gets pain on the top of her foot as well as the arch of the foot. She denies any specific injury or trauma. She has noticed a small amount of swelling to the right foot. Denies any redness or warmth. She says the surgery site is doing well in this area is a new area of pain. Denies any systemic complaints such as fevers, chills, nausea, vomiting. No acute changes since last appointment, and no other complaints at this time.   Objective: AAO x3, NAD DP/PT pulses palpable bilaterally, CRT less than 3 seconds There is tenderness to palpation on the plantar medial tubercle of the calcaneus at the insertion of plantar fascia as well as along the arch of the foot. Plantar fascia appears to be intact. There is mild diffuse tenderness in the dorsal aspect of the midfoot but is no specific area pinpoint bony tenderness or pain the vibratory sensation. There is no pain with ankle, metatarsals or any other area. There is mild edema to the foot but there is no erythema or increase in warmth. She presents today wearing a regular shoe. No open lesions or pre-ulcerative lesions.  No pain with calf compression, swelling, warmth, erythema  Assessment: Tendinitis, capsulitis right foot  Plan: -All treatment options discussed with the patient including all alternatives, risks, complications.  -X-rays were obtained and reviewed with the patient. No masses acute fracture or stress fracture identified today. -Compression anklet was applied today. -Medrol Dosepak. -Continue ankle brace. She has not been wearing this. -If symptoms are not improving recommend going back to the cam boot. -Ice and elevation. Limit activity. -Follow-up as scheduled. Call any questions or concerns. -Patient encouraged to call  the office with any questions, concerns, change in symptoms.    *If symptoms continue x-ray next appointment.  Ovid CurdMatthew Wagoner, DPM

## 2016-12-29 ENCOUNTER — Other Ambulatory Visit: Payer: Self-pay | Admitting: Gastroenterology

## 2016-12-29 ENCOUNTER — Ambulatory Visit
Admission: RE | Admit: 2016-12-29 | Discharge: 2016-12-29 | Disposition: A | Discharge status: Home / Self Care | Payer: Medicare Other | Source: Ambulatory Visit | Attending: Gastroenterology | Admitting: Gastroenterology

## 2016-12-29 DIAGNOSIS — K5900 Constipation, unspecified: Secondary | ICD-10-CM

## 2017-01-02 ENCOUNTER — Ambulatory Visit
Admission: RE | Admit: 2017-01-02 | Discharge: 2017-01-02 | Disposition: A | Discharge status: Home / Self Care | Payer: Medicare Other | Source: Ambulatory Visit | Attending: Gastroenterology | Admitting: Gastroenterology

## 2017-01-02 ENCOUNTER — Other Ambulatory Visit: Payer: Self-pay | Admitting: Gastroenterology

## 2017-01-02 DIAGNOSIS — Z09 Encounter for follow-up examination after completed treatment for conditions other than malignant neoplasm: Secondary | ICD-10-CM

## 2017-01-06 ENCOUNTER — Encounter: Payer: Self-pay | Admitting: Podiatry

## 2017-01-06 ENCOUNTER — Ambulatory Visit (INDEPENDENT_AMBULATORY_CARE_PROVIDER_SITE_OTHER): Payer: Medicare PPO | Admitting: Podiatry

## 2017-01-06 DIAGNOSIS — M779 Enthesopathy, unspecified: Secondary | ICD-10-CM

## 2017-01-06 NOTE — Progress Notes (Signed)
Subjective: 56 year old female presents the office they for follow-up evaluation of right foot pain. She states that she is doing much better and the right foot. She still gets some occasional intermittent discomfort she states is nowhere near where it used to be. She states that she's not having any swelling or redness. She's able to wear regular shoe without any problems. No recent injury or trauma.  Denies any systemic complaints such as fevers, chills, nausea, vomiting. No acute changes since last appointment, and no other complaints at this time.   Objective: AAO x3, NAD DP/PT pulses palpable bilaterally, CRT less than 3 seconds On the right foot there is no area pinpoint bony tenderness or pain the vibratory sensation to the foot or ankle. There is no overlying edema, erythema, increase. Surgically upon palpation to the distal portion of the peroneal tendon she has some intermittent discomfort but overall is doing much better there is no pain today. There is no pain along the course or insertion the plantar fascia. Achilles tendon intact. She presents today wearing a regular shoe. No open lesions or pre-ulcerative lesions.  No pain with calf compression, swelling, warmth, erythema  Assessment: Resolving right foot pain with significant improvement in symptoms.  Plan: -All treatment options discussed with the patient including all alternatives, risks, complications.  -At this time she is doing much better and having no pain today. Recommend continue supportive shoes as well as inserts. Continue range of motion, rehabilitation exercises. At this point follow-up as needed. Call any questions or concerns and she agrees to this plan.  Ovid CurdMatthew Lelynd Poer, DPM

## 2017-01-26 ENCOUNTER — Encounter: Payer: Self-pay | Admitting: Emergency Medicine

## 2017-01-26 ENCOUNTER — Emergency Department
Admission: EM | Admit: 2017-01-26 | Discharge: 2017-01-26 | Disposition: A | Discharge status: Home / Self Care | Payer: Medicare PPO | Source: Home / Self Care | Attending: Family Medicine | Admitting: Family Medicine

## 2017-01-26 DIAGNOSIS — H6123 Impacted cerumen, bilateral: Secondary | ICD-10-CM | POA: Diagnosis not present

## 2017-01-26 NOTE — ED Provider Notes (Signed)
[facility]    CSN: [id number] Arrival date & time: 01/26/17  1203     History   Chief Complaint Chief Complaint  Patient presents with  . Otalgia    HPI Dana [name] is a 56 y.o. female.   Patient went swimming yesterday, and today her right ear feels clogged.  No fever or sinus congestion.  Patient also notes that she has had intermittently recurring vertigo for several months, usually worse in the morning.  It improves somewhat with meclizine.   The history is provided by the patient.  Otalgia  Location:  Right Behind ear:  No abnormality Quality:  Dull Severity:  No pain Onset quality:  Sudden Duration:  1 day Timing:  Constant Progression:  Unchanged Chronicity:  New Context: water in ear   Relieved by:  Nothing Worsened by:  Nothing Ineffective treatments:  None tried Associated symptoms: hearing loss   Associated symptoms: no congestion, no cough, no ear discharge, no fever, no headaches, no neck pain, no rash, no rhinorrhea, no sore throat and no tinnitus     Past Medical History:  Diagnosis Date  . Arthritis    oa  . Bipolar disorder (HCC)   . Complication of anesthesia   . GERD (gastroesophageal reflux disease)   . Headache    migraines  . Hypertension   . Palpitations last 1 month ago  . PONV (postoperative nausea and vomiting)   . Sleep apnea     Patient Active Problem List   Diagnosis Date Noted  . Plantar fasciitis 10/22/2015  . Torn tendon 10/22/2015  . OA (osteoarthritis) of hip 03/18/2015    Past Surgical History:  Procedure Laterality Date  . ABDOMINAL HYSTERECTOMY  1998   complete  . CESAREAN SECTION  1991  . CHOLECYSTECTOMY  2005  . left hip muscle tear surgery  2008  . shoulder scope Left 2015  . 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 TUBAL LIGATION  1992     OB History    No data available       Home Medications    Prior to Admission medications   Medication Sig Start Date End Date Taking? Authorizing Provider  ALPRAZolam Prudy Feeler) 0.5 MG tablet Take 0.5 mg by mouth at bedtime. 12/15/14   [provider]  gabapentin (NEURONTIN) 600 MG tablet Take 600 mg by mouth at bedtime. 02/19/15   [provider]  lithium carbonate 300 MG capsule Take 600 mg by mouth 2 (two) times daily. 02/19/15   [provider]    Family History No family history on file.  Social History Social History  Substance Use Topics  . Smoking status: Former Smoker    Packs/day: 1.00    Years: 34.00    Types: Cigarettes    Quit date: 06/07/2011  . Smokeless tobacco: Never Used  . Alcohol use No     Allergies   Anesthetics, amide and Codeine   Review of Systems Review of Systems  Constitutional: Negative for fever.  HENT: Positive for ear pain and hearing loss. Negative for congestion, ear discharge, rhinorrhea, sore throat and tinnitus.   Respiratory: Negative for cough.   Musculoskeletal: Negative for neck pain.  Skin: Negative for rash.  Neurological: Negative for headaches.  All other systems reviewed and are negative.    Physical Exam Triage Vital Signs ED Triage  Vitals  Enc Vitals Group     BP 01/26/17 1235 118/78     Pulse Rate 01/26/17 1235 81     Resp --      Temp 01/26/17 1235 97.8 F (36.6 C)     Temp Source 01/26/17 1235 Oral     SpO2 01/26/17 1235 98 %     Weight 01/26/17 1236 240 lb (108.9 kg)     Height 01/26/17 1236 5\' 6"  (1.676 m)     Head Circumference --      Peak Flow --      Pain Score 01/26/17 1236 10     Pain Loc --      Pain Edu? --      Excl. in GC? --    No data found.   Updated Vital Signs BP 118/78 (BP Location: Left Arm)   Pulse 81   Temp 97.8 F (36.6 C) (Oral)   Ht 5\' 6"  (1.676 m)   Wt 240 lb (108.9 kg)   SpO2 98%   BMI 38.74 kg/m   Visual Acuity Right Eye Distance:    Left Eye Distance:   Bilateral Distance:    Right Eye Near:   Left Eye Near:    Bilateral Near:     Physical Exam Nursing notes and Vital Signs reviewed. Appearance:  Patient appears stated age, and in no acute distress Eyes:  Pupils are equal, round, and reactive to light and accomodation.  Extraocular movement is intact.  Conjunctivae are not inflamed  Ears:  Canals occluded with cerumen bilaterally.  Post lavage, canals and tympanic membranes appear normal.  Nose:  Normal turbinates.  .  Neck:  Supple.  No adenopathy. Heart:  Rate normal Lungs:  Rate normal  UC Treatments / Results  Labs (all labs ordered are listed, but only abnormal results are displayed) Labs Reviewed -   Tympanometry:  Right ear tympanogram normal; Left ear tympanogram normal  EKG  EKG Interpretation None       Radiology No results found.  Procedures Procedures (including critical care time)  Medications Ordered in UC Medications - No data to display   Initial Impression / Assessment and Plan / UC Course  I have reviewed the triage vital signs and the nursing notes.  Pertinent labs & imaging results that were available during my care of the patient were reviewed by me and considered in my medical decision making (see chart for details).    No evidence otitis media. Call if ear drainage develops (would start cortisporin) Recommend follow-up with ENT for evaluation of recurrent dizziness.    Final Clinical Impressions(s) / UC Diagnoses   Final diagnoses:  Bilateral impacted cerumen    New Prescriptions New Prescriptions   No medications on file         Lattie Haw, MD 01/26/17 1334

## 2017-01-26 NOTE — ED Triage Notes (Signed)
Right ear feels clogged after swimming yesterday

## 2017-03-09 ENCOUNTER — Ambulatory Visit: Payer: Medicare PPO | Admitting: Podiatry

## 2017-03-14 ENCOUNTER — Ambulatory Visit: Payer: Medicare PPO | Admitting: Podiatry

## 2017-03-15 ENCOUNTER — Ambulatory Visit (INDEPENDENT_AMBULATORY_CARE_PROVIDER_SITE_OTHER): Payer: Medicare PPO | Admitting: Podiatry

## 2017-03-15 ENCOUNTER — Encounter: Payer: Self-pay | Admitting: Podiatry

## 2017-03-15 DIAGNOSIS — M779 Enthesopathy, unspecified: Secondary | ICD-10-CM

## 2017-03-15 DIAGNOSIS — M7750 Other enthesopathy of unspecified foot: Secondary | ICD-10-CM

## 2017-03-15 MED ORDER — TRAMADOL HCL 50 MG PO TABS: 50.0000 mg | ORAL_TABLET | Freq: Two times a day (BID) | ORAL | 0 refills | Status: DC | PRN

## 2017-03-15 MED ORDER — IBUPROFEN 600 MG PO TABS: 600.0000 mg | ORAL_TABLET | Freq: Three times a day (TID) | ORAL | 0 refills | Status: DC | PRN

## 2017-03-15 NOTE — Progress Notes (Signed)
Subjective: Kyleena presents the office today for concerns of swelling and pain in the also asked that her right foot and ankle. She states that she is risks and swelling after his been her feet quite a bit and she's not been wearing a compression sock. I she gets more swelling starts to get discomfort to the area. She denies any recent injury or trauma. She presents today wearing a Vionic sandal.  Denies any systemic complaints such as fevers, chills, nausea, vomiting. No acute changes since last appointment, and no other complaints at this time.   Objective: AAO x3, NAD DP/PT pulses palpable bilaterally, CRT less than 3 seconds Scar from the prior surgery is well-healed. There is mild edema along the lateral aspect of the ankle but there is no erythema or increase in warmth there is tenderness along the course the peroneal tendon but the majority tenderness appears to be inferior to the scar from the prior surgery and just proximal the fifth metatarsal base. Tendon appears to be intact. There is no area pinpoint bony tenderness or pain the vibratory sensation. Is no pain on the sinus tarsi..  No open lesions or pre-ulcerative lesions.  No pain with calf compression, swelling, warmth, erythema  Assessment: Left peroneal tendinitis, swelling   Plan: -All treatment options discussed with the patient including all alternatives, risks, complications.  -At this time a discussed an MRI to rule out tear however she declines this. She'll discharge different and time, [facility] medicine. Prescribed ibuprofen 600 milligrams 3 times a day to see if this will help at 1 her continue with the compression sock as well as ice to the area daily. Continue supportive shoe gear and ankle brace if needed. Continue range of motion, rehabilitation exercises. Tramadol for pain prn.  -As an aside today we had a long discussion regards to her personal life. She's been doing online dating and she met a manhis she's never met. He  has been asking her for money and she states she has given this man $17,000. The guy is now getting upset with her and she is very upset about this guy today. She has come to the realization he is not real and is being scammed. I asked her if this person had her home address and she stated yes. I informed her that she should call the police and file a report. She does not want her family to find out.  -Patient encouraged to call the office with any questions, concerns, change in symptoms.   Celesta Gentile, DPM

## 2017-03-22 ENCOUNTER — Telehealth: Payer: Self-pay | Admitting: Podiatry

## 2017-03-22 MED ORDER — HYDROCODONE-ACETAMINOPHEN 5-325 MG PO TABS: ORAL_TABLET | ORAL | 0 refills | Status: DC

## 2017-03-22 NOTE — Telephone Encounter (Addendum)
I told pt the Medication orders we see showed she had last been given in 2016 for gabapentin, xanax, and lithium carbonate, and that with the history we had, it looked like she was not on any of those medication. Pt yelled,"Will you let me talk?" Then the line went silent. I called pt back and apologized for the line going dead, that we were testing new equipment for the phone. Pt states it didn't go dead she hung up, because I would not let her talk. Pt stated it was not true that she wasn't getting those medications, she got them every 3 months, and not to tell him that. I told pt I was trying to explain why Dr. Ardelle AntonWagoner wrote the Tramadol. Pt states she wanted something for pain, he didn't give her any and she wasn't a drug addict. I told he had prescribed the Tramadol for pain, but I would inform him of the contraindication. I informed Dr. Ardelle AntonWagoner of pt's conversation and request. Dr. Ardelle AntonWagoner ordered Vicodin 5/325mg  #15 one tablet every 8 hours prn foot pain. I informed pt and she asked if Dr. Ardelle AntonWagoner was going to order anything for inflammation, that her foot hurts worse than before the surgery and swells. I told her the swelling was not unusual, and if she would like to come in sooner than her appt in 3 weeks, and I would inform Dr. Ardelle AntonWagoner of her concerns.03/23/2017-Pt presented to pick up Hydrocodone 5/325mg  rx. Dr. Ardelle AntonWagoner also ordered Meloxicam 15mg  #30 one daily. I explained to pt the meloxicam and recommended taking with food and not at the same time as the hydrocodone. Pt states understanding and apologized for yelling yesterday. I told pt there was no problem, that we just had a situation that was initially difficult to understand, but we got it worked out.

## 2017-03-22 NOTE — Telephone Encounter (Signed)
I saw Dr. Ardelle AntonWagoner last week and he prescribed a medication for me. When I went to my pharmacy to pick it up they told me I should not take it due to toxicity. Please call me back at [phone]. Thank you.

## 2017-03-22 NOTE — Addendum Note (Signed)
Addended by: Alphia Kava'CONNELL, Jakeya Gherardi D on: 03/22/2017 05:15 PM   Modules accepted: Orders

## 2017-04-13 ENCOUNTER — Ambulatory Visit: Payer: Medicare PPO | Admitting: Podiatry

## 2017-04-13 ENCOUNTER — Encounter: Payer: Self-pay | Admitting: Podiatry

## 2017-04-13 DIAGNOSIS — T148XXA Other injury of unspecified body region, initial encounter: Secondary | ICD-10-CM

## 2017-04-13 DIAGNOSIS — M779 Enthesopathy, unspecified: Secondary | ICD-10-CM

## 2017-04-16 NOTE — Progress Notes (Signed)
Subjective: Dana [name] presents the office today for concerns of swelling and pain in the also asked that her right foot and ankle.  She states that she still gets quite a bit of swelling to the outside aspect of the foot.  She says the ankle is doing better but she points along the lateral aspect of foot where she still gets discomfort.  She presents today wearing a flat sandal, shoe.  She denies any recent injury or trauma she denies any overlying redness or warmth to the area.  She denies any recent injury or trauma.  She states that it is worse at the end of the day.  She does wear compression anklet at times.  She has no other concerns. Denies any systemic complaints such as fevers, chills, nausea, vomiting. No acute changes since last appointment, and no other complaints at this time.   Objective: AAO x3, NAD DP/PT pulses palpable bilaterally, CRT less than 3 seconds Scar from the prior surgery is well-healed.  There does appear to be increased swelling to the lateral aspect of the foot compared to the contralateral extremity but there is no overlying erythema or increased warmth and there is no clinical signs of infection.  There is tenderness in the peroneal tendon just proximal to the fifth metatarsal base as well as insertional to the fifth metatarsal base.  There is no significant tenderness along the tendon along the previous repair.  The tendon overall appears to be intact.  Ankle, subtalar joint range of motion intact.  There is no area of pinpoint bony tenderness or pain to vibratory sensation. No open lesions or pre-ulcerative lesions.  No pain with calf compression, swelling, warmth, erythema  Assessment: Left peroneal tendinitis, swelling which continues  Plan: -All treatment options discussed with the patient including all alternatives, risks, complications.  -At this point I have recommended MRI again for her and she wishes to go ahead and proceed with this.  We will try to get the MRI  more of the foot.  This point she has had ongoing conservative treatment without any significant improvement. -Continue with compression ankle.  Ankle brace if needed. MRI is for potential surgical planning.  -Anti-inflammatories as needed.  Would hold off on any pain medication. -Follow-up after MRI or sooner if needed.  Vivi BarrackMatthew R Kelsei Defino DPM

## 2017-04-17 ENCOUNTER — Telehealth: Payer: Self-pay | Admitting: *Deleted

## 2017-04-17 DIAGNOSIS — T148XXA Other injury of unspecified body region, initial encounter: Secondary | ICD-10-CM

## 2017-04-17 NOTE — Telephone Encounter (Signed)
-----   Message from Vivi BarrackMatthew R Wagoner, DPM sent at 04/16/2017  8:36 AM EST ----- Can you please order an MRI of the right foot to look for peroneal tendon tear? If they will let lets go ahead and get the ankle as well since her pain is in between. Thanks.

## 2017-04-17 NOTE — Telephone Encounter (Signed)
Orders given to D. [facility] for SUPER[employer]U IN[employer]re-cert, faxed to [facility].

## 2017-04-20 ENCOUNTER — Other Ambulatory Visit: Payer: Self-pay | Admitting: Podiatry

## 2017-05-04 ENCOUNTER — Ambulatory Visit
Admission: RE | Admit: 2017-05-04 | Discharge: 2017-05-04 | Disposition: A | Discharge status: Home / Self Care | Payer: Medicare PPO | Source: Ambulatory Visit | Attending: Podiatry | Admitting: Podiatry

## 2017-05-05 ENCOUNTER — Telehealth: Payer: Self-pay | Admitting: *Deleted

## 2017-05-05 NOTE — Telephone Encounter (Signed)
-----   Message from Vivi BarrackMatthew R Wagoner, DPM sent at 05/04/2017  4:37 PM EST ----- The tendons are intact, no signs of tear. Please let her know. I would try a compression stocking, inserts, supportive shoes.

## 2017-05-05 NOTE — Telephone Encounter (Signed)
I informed pt of Dr. Gabriel RungWagoner's orders, instructed compression stockings necessity, orthotic and supportive shoes like New Balance, Asiacs. Pt states she has orthotics from our office and just purchased Asiacs. I told pt she needed to wear the orthotics in the Asiacs and make an appt to be seen in 1 month, sooner if necessary. Pt states understanding. Mailed rx and [facility] flyer to pt for compression stockings.

## 2017-05-05 NOTE — Telephone Encounter (Signed)
-----   Message from Vivi BarrackMatthew R Wagoner, DPM sent at 05/05/2017  2:00 PM EST ----- She should have orthotics. She has been wearing flip flops

## 2017-05-24 ENCOUNTER — Telehealth: Payer: Self-pay | Admitting: *Deleted

## 2017-05-24 MED ORDER — MELOXICAM 15 MG PO TABS: 15.0000 mg | ORAL_TABLET | Freq: Every day | ORAL | 0 refills | Status: DC

## 2017-05-24 NOTE — Telephone Encounter (Signed)
Refill request meloxicam. Dr. Bary CastillaWagoner okayed refill as previously.

## 2017-06-02 ENCOUNTER — Telehealth: Payer: Self-pay | Admitting: *Deleted

## 2017-06-02 MED ORDER — MELOXICAM 15 MG PO TABS: 15.0000 mg | ORAL_TABLET | Freq: Every day | ORAL | 0 refills | Status: DC

## 2017-06-02 NOTE — Telephone Encounter (Signed)
Refill request of 90 days of meloxicam. Dr. Ardelle AntonWagoner ordered meloxicam #90, no refills.

## 2017-07-17 ENCOUNTER — Ambulatory Visit: Payer: Medicare PPO | Admitting: Podiatry

## 2018-01-23 ENCOUNTER — Ambulatory Visit: Payer: Medicare HMO | Admitting: Podiatry

## 2018-01-23 ENCOUNTER — Ambulatory Visit: Payer: Medicare HMO

## 2018-01-23 ENCOUNTER — Encounter: Payer: Self-pay | Admitting: Podiatry

## 2018-01-23 ENCOUNTER — Ambulatory Visit (INDEPENDENT_AMBULATORY_CARE_PROVIDER_SITE_OTHER): Payer: Medicare HMO

## 2018-01-23 DIAGNOSIS — M7751 Other enthesopathy of right foot: Secondary | ICD-10-CM

## 2018-01-23 DIAGNOSIS — M779 Enthesopathy, unspecified: Secondary | ICD-10-CM

## 2018-01-23 DIAGNOSIS — M2550 Pain in unspecified joint: Secondary | ICD-10-CM

## 2018-01-23 DIAGNOSIS — G629 Polyneuropathy, unspecified: Secondary | ICD-10-CM | POA: Diagnosis not present

## 2018-01-23 MED ORDER — DICLOFENAC SODIUM 1 % TD GEL: 2.0000 g | Freq: Four times a day (QID) | TRANSDERMAL | 2 refills | Status: DC

## 2018-01-25 NOTE — Progress Notes (Signed)
Subjective: 57 year old female presents the office today for concerns of pain to both of her feet.  She states that she is having numbness in both of her feet she is been seeing a neurologist and she is scheduled for nerve conduction test later this week.  On the right foot she states that she is doing well the surgical site of the majority tenderness now is to the back of the heel she points on the Achilles tendon at times as well as the side of her foot on the outside.  She denies any recent injury or trauma.  She does wear compression anklet the majority the time.  She denies any recent injury or trauma.  She has no other concerns. Denies any systemic complaints such as fevers, chills, nausea, vomiting. No acute changes since last appointment, and no other complaints at this time.   Objective: AAO x3, NAD DP/PT pulses palpable bilaterally, CRT less than 3 seconds Subjective there is numbness to bilateral feet this is about the midfoot distally.  There is no area pinpoint bony tenderness or pain to vibratory sensation.  There is mild tenderness along the mid substance of the distal portion of the Achilles tendon on the right side but overall the Thompson test is negative and the Achilles tendon appears to be intact.  Discomfort to the fifth metatarsal base and the right foot on the insertion of the peroneal tendon overall the peroneal tendon appears to be intact as well.  There is no significant edema, erythema, increase in warmth bilaterally. No open lesions or pre-ulcerative lesions.  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  -Plan she is having a nerve conduction test later this week.  I do think a lot of her symptoms may be a result of neuropathy.  She started on gabapentin but I recommend follow-up with  neurology.  In regards to tendinitis Rendu Voltaren gel which I prescribed today.  Discussed ice to the area as well as wearing supportive shoes and inserts.  We will see how the neurology evaluation goes and will discuss further treatment pending that.  Also order some blood work for an arthritis panel.  I will see him back in 4 weeks but encouraged to call any questions or concerns the meantime.  She agrees to this plan she has no further questions.  Vivi BarrackMatthew R Wagoner DPM

## 2018-01-30 LAB — C-REACTIVE PROTEIN: CRP: 0.9 mg/L (ref <8.0)

## 2018-01-30 LAB — RHEUMATOID FACTOR: Rhuematoid fact SerPl-aCnc: <14 IU/mL (ref <14)

## 2018-01-30 LAB — HLA-B27 ANTIGEN: HLA-B27 ANTIGEN: POSITIVE — AB

## 2018-01-30 LAB — SEDIMENTATION RATE: SED RATE: 2 mm/h (ref 0–30)

## 2018-01-30 LAB — ANA: ANA: NEGATIVE

## 2018-01-30 LAB — CYCLIC CITRUL PEPTIDE ANTIBODY, IGG: Cyclic Citrullin Peptide Ab: <16 UNITS

## 2018-02-02 ENCOUNTER — Telehealth: Payer: Self-pay | Admitting: *Deleted

## 2018-02-02 DIAGNOSIS — M2550 Pain in unspecified joint: Secondary | ICD-10-CM

## 2018-02-02 DIAGNOSIS — M779 Enthesopathy, unspecified: Secondary | ICD-10-CM

## 2018-02-02 NOTE — Telephone Encounter (Signed)
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 Although this can be positive and have no clinical significance given her prolonged foot pain we should have her evaluated by rheumatology if we can put that consult in. Thanks.

## 2018-02-02 NOTE — Telephone Encounter (Signed)
I informed pt of Dr. Gabriel RungWagoner's review of results and orders. I told pt we would refer [facility] to [facility] [phone]. Pt states understanding. Pt states the cream and sock Dr. Ardelle AntonWagoner gave her is really helping. Faxed required form, clinicals and demographics [facility].

## 2018-02-08 ENCOUNTER — Ambulatory Visit (INDEPENDENT_AMBULATORY_CARE_PROVIDER_SITE_OTHER): Payer: Medicare HMO | Admitting: Podiatry

## 2018-02-08 ENCOUNTER — Ambulatory Visit (INDEPENDENT_AMBULATORY_CARE_PROVIDER_SITE_OTHER): Payer: Medicare HMO

## 2018-02-08 ENCOUNTER — Encounter: Payer: Self-pay | Admitting: Podiatry

## 2018-02-08 DIAGNOSIS — S99921A Unspecified injury of right foot, initial encounter: Secondary | ICD-10-CM | POA: Diagnosis not present

## 2018-02-08 DIAGNOSIS — M779 Enthesopathy, unspecified: Secondary | ICD-10-CM

## 2018-02-08 MED ORDER — HYDROCODONE-ACETAMINOPHEN 5-325 MG PO TABS: ORAL_TABLET | ORAL | 0 refills | Status: DC

## 2018-02-12 NOTE — Progress Notes (Signed)
Subjective: 57 year old female presents the office today for concerns of right foot pain.  She states that she did fall on Sunday she hit the frame of her closet.  Since then she has had pain to the outside aspect of her foot.  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  She states the injection that she had previously helped for about 24 hours. She did get a NCV and she was told the results were normal.  Denies any systemic complaints such as fevers, chills, nausea, vomiting. No acute changes since last appointment, and no other complaints at this time.   Objective: AAO x3, NAD DP/PT pulses palpable bilaterally, CRT less than 3 seconds There is tenderness palpation to the lateral aspect of the right foot more along the fourth and fifth metatarsal bases and mildly to the sinus tarsi.  Mild discomfort on the peroneal tendons.  Minimal swelling to the lateral aspect of the foot there is no erythema or warmth.  Ankle, subtalar joint range of motion intact. No open lesions or pre-ulcerative lesions.  No pain with calf compression, swelling, warmth, erythema  Assessment: Right foot contusion  Plan: -All treatment options discussed with the patient including all alternatives, risks, complications.  -X-rays were obtained reviewed.  No definitive evidence of acute fracture.  However given her pain I do recommend immobilization in the cam boot that she already has at home.  Prescribed Vicodin to take as needed.  Ice elevation.  Continue Voltaren gel.  If symptoms continue MRI. -Patient encouraged to call the office with any questions, concerns, change in symptoms.   Vivi Barrack DPM

## 2018-02-20 ENCOUNTER — Ambulatory Visit: Payer: Medicare HMO | Admitting: Podiatry

## 2018-02-22 ENCOUNTER — Ambulatory Visit (INDEPENDENT_AMBULATORY_CARE_PROVIDER_SITE_OTHER): Payer: Medicare HMO

## 2018-02-22 ENCOUNTER — Ambulatory Visit: Payer: Medicare HMO | Admitting: Podiatry

## 2018-02-22 ENCOUNTER — Encounter: Payer: Self-pay | Admitting: Podiatry

## 2018-02-22 DIAGNOSIS — M722 Plantar fascial fibromatosis: Secondary | ICD-10-CM

## 2018-02-22 DIAGNOSIS — M779 Enthesopathy, unspecified: Secondary | ICD-10-CM | POA: Diagnosis not present

## 2018-02-23 ENCOUNTER — Other Ambulatory Visit: Payer: Self-pay | Admitting: Psychiatry

## 2018-02-24 LAB — CREATININE, SERUM: Creat: 1.36 mg/dL — ABNORMAL HIGH (ref 0.50–1.05)

## 2018-02-24 LAB — LITHIUM LEVEL: Lithium Lvl: 0.8 mmol/L (ref 0.6–1.2)

## 2018-02-26 NOTE — Progress Notes (Signed)
Subjective: 57 year old female presents the office today for concerns of right foot pain.  She states that she is doing "great" she is currently not having any pain to her feet.  She still does relate some numbness to both of her feet.  She has seen neurology for this.  Denies any increase in swelling to her feet or new issues. Denies any systemic complaints such as fevers, chills, nausea, vomiting. No acute changes since last appointment, and no other complaints at this time.   Objective: AAO x3, NAD DP/PT pulses palpable bilaterally, CRT less than 3 seconds There is currently no tenderness to palpation to the lateral aspect of the right foot more along the fourth and fifth metatarsal bases and mildly to the sinus tarsi where she was having symptoms before.  No significant discomfort along the peroneal tendons.  There is no swelling to the lateral aspect of the foot there is no erythema or warmth.  Ankle, subtalar joint range of motion intact. No open lesions or pre-ulcerative lesions.  No pain with calf compression, swelling, warmth, erythema  Assessment: Resolved right foot pain  Plan: -All treatment options discussed with the patient including all alternatives, risks, complications.  -At this time she is not expensing any pain.  Although her pain is much better she still occasionally gets some mild discomfort she relates that her max pain level is 2/10.  I want her to continue with the Voltaren gel, icing and general rehab exercises.  We also discussed wearing a more supportive shoe with orthotic.  She continues to wear sandal, flip-flop.  Vivi BarrackMatthew R Wagoner DPM

## 2018-03-22 ENCOUNTER — Ambulatory Visit: Payer: Medicare HMO | Admitting: Podiatry

## 2018-03-23 ENCOUNTER — Encounter: Payer: Self-pay | Admitting: Emergency Medicine

## 2018-03-30 ENCOUNTER — Ambulatory Visit: Payer: Medicare HMO | Admitting: Podiatry

## 2018-04-03 ENCOUNTER — Ambulatory Visit: Payer: Self-pay | Admitting: Psychiatry

## 2018-04-03 ENCOUNTER — Ambulatory Visit: Payer: Medicare HMO | Admitting: Podiatry

## 2018-04-12 ENCOUNTER — Ambulatory Visit: Payer: Medicare HMO | Admitting: Podiatry

## 2018-04-23 ENCOUNTER — Ambulatory Visit: Payer: Medicare HMO | Admitting: Podiatry

## 2018-04-25 ENCOUNTER — Other Ambulatory Visit: Payer: Self-pay | Admitting: *Deleted

## 2018-04-25 MED ORDER — DICLOFENAC SODIUM 1 % TD GEL: 2.0000 g | Freq: Four times a day (QID) | TRANSDERMAL | 1 refills | Status: DC

## 2018-04-30 ENCOUNTER — Ambulatory Visit (INDEPENDENT_AMBULATORY_CARE_PROVIDER_SITE_OTHER): Payer: 59 | Admitting: Podiatry

## 2018-04-30 ENCOUNTER — Telehealth: Payer: Self-pay | Admitting: *Deleted

## 2018-04-30 DIAGNOSIS — M779 Enthesopathy, unspecified: Secondary | ICD-10-CM

## 2018-04-30 DIAGNOSIS — G8929 Other chronic pain: Secondary | ICD-10-CM

## 2018-04-30 DIAGNOSIS — T148XXA Other injury of unspecified body region, initial encounter: Secondary | ICD-10-CM

## 2018-04-30 DIAGNOSIS — M722 Plantar fascial fibromatosis: Secondary | ICD-10-CM

## 2018-04-30 DIAGNOSIS — M79671 Pain in right foot: Secondary | ICD-10-CM | POA: Diagnosis not present

## 2018-04-30 MED ORDER — HYDROCODONE-ACETAMINOPHEN 5-325 MG PO TABS: ORAL_TABLET | ORAL | 0 refills | Status: DC

## 2018-04-30 NOTE — Progress Notes (Signed)
Subjective: 57 year old female presents the office today for follow-up evaluation and continued pain to her right foot.  I last saw her she was doing well she denies any significant pain.  She does have some ongoing numbness to her feet but she has been ruled out about neuropathy or nerve entrapment.  She states that she still gets some pain and swelling to the top of her right foot and into the toes.  She states it does make a difference if he wears a tennis shoe as is more supportive and does not cause any issues but if she takes her shoes off she notices swelling and pain.  She denies any recent injury or trauma.  She denies any significant swelling today. Denies any systemic complaints such as fevers, chills, nausea, vomiting. No acute changes since last appointment, and no other complaints at this time.   Objective: AAO x3, NAD DP/PT pulses palpable bilaterally, CRT less than 3 seconds On today's exam I am unable to elicit any area pinpoint tenderness.  Subjective there is tenderness on the Lisfranc joint on the right foot there is no sniffing edema, erythema.  There is no increased warmth.  There is no open lesions.  No pain to palpation on the previous surgical site on the peroneal tendon.  Peroneal tendon appears intact.  Achilles tendon intact.   No open lesions or pre-ulcerative lesions.  No pain with calf compression, swelling, warmth, erythema  Assessment: Right chronic foot pain  Plan: -All treatment options discussed with the patient including all alternatives, risks, complications.  -At this time given her ongoing symptoms moderate repeat the MRI of the right foot as is been over a year since she last had this done.  She is having ongoing swelling pain to the foot.  I do not think is related to the surgery she had previously.  Her surgical site is been doing well. -Vicodin prn for pain -Continue Voltaren gel as well -Ice -Continue with supportive shoes/inserts -Patient encouraged  to call the office with any questions, concerns, change in symptoms.   Vivi BarrackMatthew R Dodie Parisi DPM

## 2018-04-30 NOTE — Telephone Encounter (Signed)
Orders to J. Quintana, RN for pre-cert, faxed to [facility]. 

## 2018-04-30 NOTE — Telephone Encounter (Signed)
-----   Message from Vivi BarrackMatthew R Wagoner, DPM sent at 04/30/2018 11:56 AM EST ----- Can you please order an MRI of the right foot due to chronic pain, numbness. Rule out tendon tear? Thanks.

## 2018-05-07 ENCOUNTER — Encounter: Payer: Self-pay | Admitting: Psychiatry

## 2018-05-07 ENCOUNTER — Ambulatory Visit (INDEPENDENT_AMBULATORY_CARE_PROVIDER_SITE_OTHER): Payer: Medicare HMO | Admitting: Psychiatry

## 2018-05-07 VITALS — BP 118/76 | HR 80

## 2018-05-07 DIAGNOSIS — F5101 Primary insomnia: Secondary | ICD-10-CM | POA: Diagnosis not present

## 2018-05-07 DIAGNOSIS — Z79899 Other long term (current) drug therapy: Secondary | ICD-10-CM

## 2018-05-07 DIAGNOSIS — F319 Bipolar disorder, unspecified: Secondary | ICD-10-CM

## 2018-05-07 DIAGNOSIS — F419 Anxiety disorder, unspecified: Secondary | ICD-10-CM

## 2018-05-07 MED ORDER — LITHIUM CARBONATE 300 MG PO CAPS: ORAL_CAPSULE | ORAL | 4 refills | Status: DC

## 2018-05-07 NOTE — Progress Notes (Signed)
Dana [name] [id number] 12-20-1960 57 y.o.  Subjective:   Patient ID:  Dana [name] is a 57 y.o. (DOB [birth year]) female.  Chief Complaint:  Chief Complaint  Patient presents with  . Follow-up    h/o Mood Instability, Anxiety, and Insomnia    HPI Dana [name] presents to the office today for follow-up of mood instability and anxiety. She reports that her mood has been "ok." Reports some situational sadness about estranged relationship with oldest son and his family. Denies persistent sad mood. Mood has been stable. Denies any recent manic s/s. Denies impulsive or risky behaviors. Reports that she has been sleeping well with one Xanax at bedtime. Denies any significant anxiety. Appetite has been stable. Reports that energy and motivation have been "ok." Denies difficulty with concentration. Denies SI.   Reports that her youngest son continues to communicate with her and their relationship has been good. Reports that oldest son and his wife continue to not interact with her. Was able to speak with granddaughter on her birthday. She reports that she briefly dated someone and this did not work out well. Reports some financial stress.    Review of Systems:  Review of Systems  HENT:       Reports that she has had a recent cold/virus  Gastrointestinal: Positive for constipation. Negative for diarrhea, nausea and vomiting.  Musculoskeletal: Negative for gait problem.       Foot pain. Reports that she will have an MRI this week.   Neurological: Negative for tremors.  Psychiatric/Behavioral:       Please refer to HPI    Medications: I have reviewed the patient's current medications.  Current Outpatient Medications  Medication Sig Dispense Refill  . albuterol (PROVENTIL HFA;VENTOLIN HFA) 108 (90 Base) MCG/ACT inhaler Inhale into the lungs.    . ALPRAZolam (XANAX) 0.5 MG tablet Take 0.5 mg by mouth at bedtime.  1  . Cholecalciferol (VITAMIN D) 2000 units tablet Take by mouth.    .  cyclobenzaprine (FLEXERIL) 10 MG tablet Take 10 mg by mouth 3 (three) times daily as needed for muscle spasms.    . diclofenac sodium (VOLTAREN) 1 % GEL Apply 2 g topically 4 (four) times daily. Rub into affected area of foot 2 to 4 times daily 100 g 2  . diclofenac sodium (VOLTAREN) 1 % GEL Apply 2 g topically 4 (four) times daily. 100 g 1  . dicyclomine (BENTYL) 20 MG tablet Take 20 mg every 6 (six) hours by mouth.    . gabapentin (NEURONTIN) 600 MG tablet Take 600 mg by mouth at bedtime.  1  . HYDROcodone-acetaminophen (NORCO/VICODIN) 5-325 MG tablet Take one tablet every 8 hours prn foot pain. 10 tablet 0  . ibuprofen (ADVIL,MOTRIN) 600 MG tablet Take 1 tablet (600 mg total) by mouth every 8 (eight) hours as needed. 30 tablet 0  . lisinopril (PRINIVIL,ZESTRIL) 2.5 MG tablet     . lithium carbonate 300 MG capsule Take 1 capsule po q am and 2 capsules po QHS 90 capsule 4  . meclizine (ANTIVERT) 25 MG tablet Take 25 mg 3 (three) times daily as needed by mouth for dizziness.    . Multiple Vitamins-Minerals (CENTRUM SILVER 50+WOMEN PO) Take by mouth.    Marland Kitchen MYRBETRIQ 50 MG TB24 tablet Take 50 mg by mouth daily.  11  . Omega-3 Fatty Acids (FISH OIL) 1000 MG CAPS Take by mouth.    . rizatriptan (MAXALT) 10 MG tablet Take by mouth.    Marland Kitchen  rosuvastatin (CRESTOR) 5 MG tablet Take 5 mg daily by mouth.    . conjugated estrogens (PREMARIN) vaginal cream     . fluticasone (FLONASE) 50 MCG/ACT nasal spray     . ondansetron (ZOFRAN) 8 MG tablet SMARTSIG:1 Tablet(s) By Mouth Every 8 Hours PRN    . traMADol (ULTRAM) 50 MG tablet Take 1 tablet (50 mg total) by mouth every 12 (twelve) hours as needed. (Patient not taking: Reported on 05/07/2018) 15 tablet 0   No current facility-administered medications for this visit.     Medication Side Effects: None  Allergies:  Allergies  Allergen Reactions  . Anesthetics, Amide Nausea And Vomiting  . Other   . Oxybutynin Other (See Comments)  . Codeine Nausea And  Vomiting and Nausea Only    Past Medical History:  Diagnosis Date  . Arthritis    oa  . Bipolar disorder (HCC)   . Complication of anesthesia   . GERD (gastroesophageal reflux disease)   . Headache    migraines  . Hyperlipidemia   . Hypertension   . Palpitations last 1 month ago  . PONV (postoperative nausea and vomiting)   . Sleep apnea     Family History  Problem Relation Age of Onset  . Mood Disorder Mother   . Colon cancer Mother   . Heart disease Father   . Bipolar disorder Son     Social History   Socioeconomic History  . Marital status: Widowed    Spouse name: Not on file  . Number of children: Not on file  . Years of education: Not on file  . Highest education level: Not on file  Occupational History  . Not on file  Social Needs  . Financial resource strain: Not on file  . Food insecurity:    Worry: Not on file    Inability: Not on file  . Transportation needs:    Medical: Not on file    Non-medical: Not on file  Tobacco Use  . Smoking status: Former Smoker    Packs/day: 1.00    Years: 34.00    Pack years: 34.00    Types: Cigarettes    Last attempt to quit: 06/07/2011    Years since quitting: 6.9  . Smokeless tobacco: Never Used  Substance and Sexual Activity  . Alcohol use: No  . Drug use: No  . Sexual activity: Not on file  Lifestyle  . Physical activity:    Days per week: Not on file    Minutes per session: Not on file  . Stress: Not on file  Relationships  . Social connections:    Talks on phone: Not on file    Gets together: Not on file    Attends religious service: Not on file    Active member of club or organization: Not on file    Attends meetings of clubs or organizations: Not on file    Relationship status: Not on file  . Intimate partner violence:    Fear of current or ex partner: Not on file    Emotionally abused: Not on file    Physically abused: Not on file    Forced sexual activity: Not on file  Other Topics Concern  .  Not on file  Social History Narrative  . Not on file    Past Medical History, Surgical history, Social history, and Family history were reviewed and updated as appropriate.   Please see review of systems for further details on the patient's review from  today.   Objective:   Physical Exam:  BP 118/76   Pulse 80   Physical Exam  Constitutional: She is oriented to person, place, and time. She appears well-developed. No distress.  Musculoskeletal: She exhibits no deformity.  Neurological: She is alert and oriented to person, place, and time. Coordination normal.  Psychiatric: She has a normal mood and affect. Her speech is normal and behavior is normal. Judgment and thought content normal. Her mood appears not anxious. Her affect is not angry, not blunt, not labile and not inappropriate. Cognition and memory are normal. She does not exhibit a depressed mood. She expresses no homicidal and no suicidal ideation. She expresses no suicidal plans and no homicidal plans.  Insight intact. No auditory or visual hallucinations. No delusions.     Lab Review:     Component Value Date/Time   NA 142 03/19/2015 0533   K 4.5 03/19/2015 0533   CL 110 03/19/2015 0533   CO2 25 03/19/2015 0533   GLUCOSE 132 (H) 03/19/2015 0533   BUN 12 03/19/2015 0533   CREATININE 1.36 (H) 02/23/2018 1127   CALCIUM 9.3 03/19/2015 0533   PROT 7.7 03/11/2015 0925   ALBUMIN 4.5 03/11/2015 0925   AST 27 03/11/2015 0925   ALT 28 03/11/2015 0925   ALKPHOS 83 03/11/2015 0925   BILITOT 0.4 03/11/2015 0925   GFRNONAA 55 (L) 03/19/2015 0533   GFRAA >60 03/19/2015 0533       Component Value Date/Time   WBC 15.4 (H) 03/19/2015 0533   RBC 3.96 03/19/2015 0533   HGB 11.5 (L) 03/19/2015 0533   HCT 35.0 (L) 03/19/2015 0533   PLT 233 03/19/2015 0533   MCV 88.4 03/19/2015 0533   MCH 29.0 03/19/2015 0533   MCHC 32.9 03/19/2015 0533   RDW 13.6 03/19/2015 0533    Lithium Lvl  Date Value Ref Range Status  02/23/2018  0.8 0.6 - 1.2 mmol/L Final     No results found for: PHENYTOIN, PHENOBARB, VALPROATE, CBMZ   .res Assessment: Plan:   Pt seen for 30 minutes and greater than 50% of visit spent counseling pt re: importance of continuing to monitor creatinine levels since creatinine was trending up on higher doses of lithium. Will order BMP and lithium level. Discussed using lowest possible effective dose of Xanax to minimize risk of tolerance and dependence and recommended taking 1/2 tab instead of 1 tab when possible. Will continue Lithium 300 mg po q am and 600 mg po QHS. Will continue Xanax prn anxiety and insomnia.  Bipolar I disorder (HCC) - Plan: lithium carbonate 300 MG capsule  High risk medication use - Plan: Lithium level, Basic metabolic panel  Anxiety disorder, unspecified type  Primary insomnia  Please see After Visit Summary for patient specific instructions.  Future Appointments  Date Time Provider Department Center  05/12/2018 11:00 AM [facility]  09/06/2018  2:00 PM Corie Chiquitoarter, Apolonio Cutting, PMHNP CP-CP None    Orders Placed This Encounter  Procedures  . Lithium level  . Basic metabolic panel      -------------------------------

## 2018-05-09 ENCOUNTER — Telehealth: Payer: Self-pay | Admitting: Podiatry

## 2018-05-09 NOTE — Telephone Encounter (Signed)
Dr. Cheral BayGendro a radiologist with [facility] is Requesting peer to peer review with Dr. Ardelle AntonWagoner of pts MRI.  Please give him a call back.   [phone]

## 2018-05-11 ENCOUNTER — Other Ambulatory Visit: Payer: Self-pay | Admitting: *Deleted

## 2018-05-11 MED ORDER — DICLOFENAC SODIUM 1 % TD GEL: 2.0000 g | Freq: Four times a day (QID) | TRANSDERMAL | 2 refills | Status: DC

## 2018-05-11 NOTE — Addendum Note (Signed)
Addended by: Hadley PenOX, Dhani Dannemiller R on: 05/11/2018 01:45 PM   Modules accepted: Orders

## 2018-05-11 NOTE — Telephone Encounter (Signed)
Called and left detailed voicemail at his request on his voicemail with my direct cell phone number to discuss.

## 2018-05-11 NOTE — Telephone Encounter (Signed)
I called Dana [name] to let her know what was going on. She said [facility] and states [facility]. I told her to ask tomorrow when she goes to make sure insurance has approved it.   She states that her foot hurts more now and is more painful than what the ankle was. She has been on her feet a lot putting up Christmas decorations. I told her to go back into the boot until the MRI is done. Ice and take it easy.  Will call with updates if I get any.

## 2018-05-12 ENCOUNTER — Ambulatory Visit (HOSPITAL_COMMUNITY)
Admission: RE | Admit: 2018-05-12 | Discharge: 2018-05-12 | Disposition: A | Discharge status: Home / Self Care | Payer: Medicare HMO | Source: Ambulatory Visit | Attending: Podiatry | Admitting: Podiatry

## 2018-05-12 DIAGNOSIS — M79671 Pain in right foot: Secondary | ICD-10-CM | POA: Insufficient documentation

## 2018-05-12 DIAGNOSIS — R200 Anesthesia of skin: Secondary | ICD-10-CM | POA: Diagnosis not present

## 2018-05-12 DIAGNOSIS — M722 Plantar fascial fibromatosis: Secondary | ICD-10-CM

## 2018-05-12 DIAGNOSIS — G8929 Other chronic pain: Secondary | ICD-10-CM | POA: Diagnosis not present

## 2018-05-12 DIAGNOSIS — T148XXA Other injury of unspecified body region, initial encounter: Secondary | ICD-10-CM

## 2018-05-12 DIAGNOSIS — M779 Enthesopathy, unspecified: Secondary | ICD-10-CM

## 2018-05-15 ENCOUNTER — Telehealth: Payer: Self-pay | Admitting: *Deleted

## 2018-05-15 ENCOUNTER — Telehealth: Payer: Self-pay | Admitting: Podiatry

## 2018-05-15 DIAGNOSIS — M779 Enthesopathy, unspecified: Secondary | ICD-10-CM

## 2018-05-15 DIAGNOSIS — T148XXA Other injury of unspecified body region, initial encounter: Secondary | ICD-10-CM

## 2018-05-15 DIAGNOSIS — M79671 Pain in right foot: Principal | ICD-10-CM

## 2018-05-15 DIAGNOSIS — G8929 Other chronic pain: Secondary | ICD-10-CM

## 2018-05-15 NOTE — Telephone Encounter (Signed)
Pt. Was wondering about MRI results.

## 2018-05-15 NOTE — Telephone Encounter (Signed)
I discussed results with pt on Result Note call.

## 2018-05-15 NOTE — Telephone Encounter (Signed)
I informed pt of Dr. Gabriel RungWagoner's review of results and orders. [facility] Hand delivered PT to [facility].

## 2018-05-15 NOTE — Telephone Encounter (Signed)
-----   Message from Vivi BarrackMatthew R Wagoner, DPM sent at 05/14/2018  8:00 AM EST ----- Val- please let her know that the MRI was negative. Continue topical Voltaren gel. She can stay in the CAM boot as well for now to help with the pain. Also can consider PT again. Have her follow-up in about 2 weeks or sooner if she needs. Thanks.

## 2018-06-01 ENCOUNTER — Other Ambulatory Visit: Payer: Self-pay | Admitting: Psychiatry

## 2018-06-01 DIAGNOSIS — F319 Bipolar disorder, unspecified: Secondary | ICD-10-CM

## 2018-06-06 LAB — SPECIMEN COMPROMISED

## 2018-06-06 LAB — BASIC METABOLIC PANEL
BUN/Creatinine Ratio: 10 (calc) (ref 6–22)
BUN: 15 mg/dL (ref 7–25)
CO2: 19 mmol/L — AB (ref 20–32)
Calcium: 10 mg/dL (ref 8.6–10.4)
Chloride: 109 mmol/L (ref 98–110)
Creat: 1.52 mg/dL — ABNORMAL HIGH (ref 0.50–1.05)
Glucose, Bld: 132 mg/dL (ref 65–139)
POTASSIUM: 4.1 mmol/L (ref 3.5–5.3)
Sodium: 143 mmol/L (ref 135–146)

## 2018-06-06 LAB — LITHIUM LEVEL: LITHIUM LVL: 0.9 mmol/L (ref 0.6–1.2)

## 2018-06-08 ENCOUNTER — Telehealth: Payer: Self-pay | Admitting: Psychiatry

## 2018-06-08 ENCOUNTER — Ambulatory Visit: Payer: Self-pay | Admitting: Podiatry

## 2018-06-08 NOTE — Telephone Encounter (Signed)
Please let pt know that most recent Creatinine level was elevated. Recommend scheduling apt to discuss plan. Lithium level was 0.9 and Creatinine was 1.52.

## 2018-06-11 MED ORDER — DIVALPROEX SODIUM ER 250 MG PO TB24: ORAL_TABLET | ORAL | 0 refills | Status: DC

## 2018-06-11 NOTE — Telephone Encounter (Signed)
Given instructions and verbalized understanding.

## 2018-06-11 NOTE — Telephone Encounter (Signed)
Given information and will schedule an office visit

## 2018-06-11 NOTE — Telephone Encounter (Signed)
Pt scheduled for 06/27/18. Please let her know that we may need to consider decreasing Lithium in the future if possible due to elevated creatinine and would like to try starting low dose Depakote for mood stabilization to prepare for possible decrease in Lithium. Please let her know that she may notice some drowsiness with Depakote and instructions will be to take it all at bedtime. Script for Depakote ER 250 mg po QHS x 1 week, then increase to 2 tabs po QHS sent to [facility] on [address]

## 2018-06-27 ENCOUNTER — Encounter: Payer: Self-pay | Admitting: Psychiatry

## 2018-06-27 ENCOUNTER — Ambulatory Visit: Payer: Medicare HMO | Admitting: Psychiatry

## 2018-06-27 DIAGNOSIS — F5101 Primary insomnia: Secondary | ICD-10-CM | POA: Diagnosis not present

## 2018-06-27 DIAGNOSIS — F319 Bipolar disorder, unspecified: Secondary | ICD-10-CM

## 2018-06-27 DIAGNOSIS — F419 Anxiety disorder, unspecified: Secondary | ICD-10-CM

## 2018-06-27 DIAGNOSIS — R7989 Other specified abnormal findings of blood chemistry: Secondary | ICD-10-CM | POA: Diagnosis not present

## 2018-06-27 MED ORDER — LITHIUM CARBONATE 300 MG PO CAPS: ORAL_CAPSULE | ORAL | 1 refills | Status: DC

## 2018-06-27 MED ORDER — ALPRAZOLAM 0.5 MG PO TABS: 0.5000 mg | ORAL_TABLET | Freq: Two times a day (BID) | ORAL | 2 refills | Status: DC | PRN

## 2018-06-27 MED ORDER — DIVALPROEX SODIUM ER 250 MG PO TB24: ORAL_TABLET | ORAL | 1 refills | Status: DC

## 2018-06-27 NOTE — Progress Notes (Signed)
Dana [name] [id number] May 29, 1961 58 y.o.  Subjective:   Patient ID:  Dana [name] is a 58 y.o. (DOB Nov 02, 1960) female.  Chief Complaint:  Chief Complaint  Patient presents with  . Medication Problem    Elevated Creatinine  . Follow-up    h/o Bipolar D/O, Anxiety, and Insomnia    HPI Dana [name] presents to the office today for follow-up of mood and anxiety. She reports that she started Depakote 250 mg po QHS a week ago. She reports that she is tolerating it without significant difficulty. She reports that her sleep has significantly improved at night since starting Depakote. Sleeping about 9 hours of sleep a night. She reports that she has started getting sleepy daily around 2 pm and then it improves after about an hour.   She reports that she has noticed some improvement since starting Depakote. She reports that she was starting to experience some depression before starting Depakote and that mood improved some since starting Depakote. Reports that mood has been "a lot better" since starting Depakote. Denies any recent manic s/s to include impulsivity, excessive spending, or elevated mood. She reports anxiety has been well controlled with Xanax. Appetite has been ok. Reports that she has intentionally lost a few pounds. Energy and motivation have been fair. She reports that she has been wanting to go to the gym and wishes she had someone to go with. She reports adequate concentration. Denies SI.    Review of Systems:  Review of Systems  Gastrointestinal: Negative for constipation.  Musculoskeletal: Negative for gait problem.  Neurological: Positive for tremors.       Reports that she has had a long-standing tremor and has noticed some increased tremor.   Psychiatric/Behavioral:       Please refer to HPI    Medications: I have reviewed the patient's current medications.  Current Outpatient Medications  Medication Sig Dispense Refill  . albuterol (PROVENTIL HFA;VENTOLIN  HFA) 108 (90 Base) MCG/ACT inhaler Inhale into the lungs.    Melene Muller ON 07/18/2018] ALPRAZolam (XANAX) 0.5 MG tablet Take 1 tablet (0.5 mg total) by mouth 2 (two) times daily as needed for up to 30 days for anxiety. 60 tablet 2  . Cholecalciferol (VITAMIN D) 2000 units tablet Take by mouth.    . conjugated estrogens (PREMARIN) vaginal cream     . cyclobenzaprine (FLEXERIL) 10 MG tablet Take 10 mg by mouth 3 (three) times daily as needed for muscle spasms.    . diclofenac sodium (VOLTAREN) 1 % GEL Apply 2 g topically 4 (four) times daily. 100 g 1  . diclofenac sodium (VOLTAREN) 1 % GEL Apply 2 g topically 4 (four) times daily. Rub into affected area of foot 2 to 4 times daily 100 g 2  . dicyclomine (BENTYL) 20 MG tablet Take 20 mg every 6 (six) hours by mouth.    . divalproex (DEPAKOTE ER) 250 MG 24 hr tablet Take 2 tab po QHS x 10 days, then increase to 3 tabs po QHS 90 tablet 1  . fluticasone (FLONASE) 50 MCG/ACT nasal spray     . gabapentin (NEURONTIN) 600 MG tablet Take 600 mg by mouth at bedtime.  1  . HYDROcodone-acetaminophen (NORCO/VICODIN) 5-325 MG tablet Take one tablet every 8 hours prn foot pain. 10 tablet 0  . ibuprofen (ADVIL,MOTRIN) 600 MG tablet Take 1 tablet (600 mg total) by mouth every 8 (eight) hours as needed. 30 tablet 0  . lisinopril (PRINIVIL,ZESTRIL) 2.5 MG tablet     .  lithium carbonate 300 MG capsule TAKE 1 CAPSULE BY MOUTH EVERY MORNING AND TAKE 2 CAPSULES BY MOUTH AT BEDTIME 270 capsule 1  . meclizine (ANTIVERT) 25 MG tablet Take 25 mg 3 (three) times daily as needed by mouth for dizziness.    . Multiple Vitamins-Minerals (CENTRUM SILVER 50+WOMEN PO) Take by mouth.    Marland Kitchen MYRBETRIQ 50 MG TB24 tablet Take 50 mg by mouth daily.  11  . Omega-3 Fatty Acids (FISH OIL) 1000 MG CAPS Take by mouth.    . ondansetron (ZOFRAN) 8 MG tablet SMARTSIG:1 Tablet(s) By Mouth Every 8 Hours PRN    . rizatriptan (MAXALT) 10 MG tablet Take by mouth.    . rosuvastatin (CRESTOR) 5 MG tablet  Take 5 mg daily by mouth.    . traMADol (ULTRAM) 50 MG tablet Take 1 tablet (50 mg total) by mouth every 12 (twelve) hours as needed. (Patient not taking: Reported on 05/07/2018) 15 tablet 0   No current facility-administered medications for this visit.     Medication Side Effects: None  Allergies:  Allergies  Allergen Reactions  . Anesthetics, Amide Nausea And Vomiting  . Other   . Oxybutynin Other (See Comments)  . Codeine Nausea And Vomiting and Nausea Only    Past Medical History:  Diagnosis Date  . Arthritis    oa  . Bipolar disorder (HCC)   . Complication of anesthesia   . GERD (gastroesophageal reflux disease)   . Headache    migraines  . Hyperlipidemia   . Hypertension   . Palpitations last 1 month ago  . PONV (postoperative nausea and vomiting)   . Sleep apnea     Family History  Problem Relation Age of Onset  . Mood Disorder Mother   . Colon cancer Mother   . Heart disease Father   . Bipolar disorder Son     Social History   Socioeconomic History  . Marital status: Widowed    Spouse name: Not on file  . Number of children: Not on file  . Years of education: Not on file  . Highest education level: Not on file  Occupational History  . Not on file  Social Needs  . Financial resource strain: Not on file  . Food insecurity:    Worry: Not on file    Inability: Not on file  . Transportation needs:    Medical: Not on file    Non-medical: Not on file  Tobacco Use  . Smoking status: Former Smoker    Packs/day: 1.00    Years: 34.00    Pack years: 34.00    Types: Cigarettes    Last attempt to quit: 06/07/2011    Years since quitting: 7.0  . Smokeless tobacco: Never Used  Substance and Sexual Activity  . Alcohol use: No  . Drug use: No  . Sexual activity: Not on file  Lifestyle  . Physical activity:    Days per week: Not on file    Minutes per session: Not on file  . Stress: Not on file  Relationships  . Social connections:    Talks on phone:  Not on file    Gets together: Not on file    Attends religious service: Not on file    Active member of club or organization: Not on file    Attends meetings of clubs or organizations: Not on file    Relationship status: Not on file  . Intimate partner violence:    Fear of current or ex  partner: Not on file    Emotionally abused: Not on file    Physically abused: Not on file    Forced sexual activity: Not on file  Other Topics Concern  . Not on file  Social History Narrative  . Not on file    Past Medical History, Surgical history, Social history, and Family history were reviewed and updated as appropriate.   Please see review of systems for further details on the patient's review from today.   Objective:   Physical Exam:  There were no vitals taken for this visit.  Physical Exam Constitutional:      General: She is not in acute distress.    Appearance: She is well-developed.  Musculoskeletal:        General: No deformity.  Neurological:     Mental Status: She is alert and oriented to person, place, and time.     Coordination: Coordination normal.  Psychiatric:        Mood and Affect: Mood is not anxious or depressed. Affect is not labile, blunt, angry or inappropriate.        Speech: Speech normal.        Behavior: Behavior normal.        Thought Content: Thought content normal. Thought content does not include homicidal or suicidal ideation. Thought content does not include homicidal or suicidal plan.        Judgment: Judgment normal.     Comments: Insight intact. No auditory or visual hallucinations. No delusions.      Lab Review:     Component Value Date/Time   NA 143 06/05/2018 1109   K 4.1 06/05/2018 1109   CL 109 06/05/2018 1109   CO2 19 (L) 06/05/2018 1109   GLUCOSE 132 06/05/2018 1109   BUN 15 06/05/2018 1109   CREATININE 1.52 (H) 06/05/2018 1109   CALCIUM 10.0 06/05/2018 1109   PROT 7.7 03/11/2015 0925   ALBUMIN 4.5 03/11/2015 0925   AST 27  03/11/2015 0925   ALT 28 03/11/2015 0925   ALKPHOS 83 03/11/2015 0925   BILITOT 0.4 03/11/2015 0925   GFRNONAA 55 (L) 03/19/2015 0533   GFRAA >60 03/19/2015 0533       Component Value Date/Time   WBC 15.4 (H) 03/19/2015 0533   RBC 3.96 03/19/2015 0533   HGB 11.5 (L) 03/19/2015 0533   HCT 35.0 (L) 03/19/2015 0533   PLT 233 03/19/2015 0533   MCV 88.4 03/19/2015 0533   MCH 29.0 03/19/2015 0533   MCHC 32.9 03/19/2015 0533   RDW 13.6 03/19/2015 0533    Lithium Lvl  Date Value Ref Range Status  06/05/2018 0.9 0.6 - 1.2 mmol/L Final     No results found for: PHENYTOIN, PHENOBARB, VALPROATE, CBMZ   .res Assessment: Plan:    Case staffed with Dr. Jennelle Humanottle.    Will refer patient to nephrology since creatinine level has increased to 1.52 on 06/05/18 from 1.36 on 02/24/18 and 1.10 on 01/26/18 after pt resumed previous lithium dose of 300 mg po q am and 600 mg po QHS on 02/01/18. (Lithium was decreased to 300 mg po BID on 12/20/17 after Creatinine levels of 1.5 on 12/12/17 and 1.61 on 10/03/08). Pt reports taking Lithium since age 58 and was taking Lithium 600 mg po BID until 10/12/17.  Will increase Depakote ER to 500 mg po QHS x 10 days, then increase to 750 mg po QHS for mood stabilization with goal of being able to reduce Lithium dosage in the future or  possibly transition pt off of Lithium and on Depakote ER for mood stabilization.  Will continue Lithium ER 300 mg po q am and 600 mg po QHS for mood stabilization during increased titration of Depakote ER and then will consider decrease in Lithium at next visit in 4 weeks if pt is tolerating therapeutic dose of Depakote ER.   Continue Xanax 0.5 mg po BID prn anxiety.   Discussed potential benefits, risks, and side effects of Depakote and need for period lab monitoring to assess for potential adverse effects to include obtaining CBC and LFTs.  Pt to f/u in 4 weeks or sooner if clinically indicated.   Patient advised to contact office with any  questions, adverse effects, or acute worsening in signs and symptoms.  Elevated serum creatinine - Plan: Ambulatory referral to Nephrology  Bipolar I disorder (HCC) - Plan: lithium carbonate 300 MG capsule, divalproex (DEPAKOTE ER) 250 MG 24 hr tablet  Anxiety disorder, unspecified type - Plan: ALPRAZolam (XANAX) 0.5 MG tablet  Primary insomnia - Plan: ALPRAZolam (XANAX) 0.5 MG tablet  Please see After Visit Summary for patient specific instructions.  Future Appointments  Date Time Provider Department Center  07/30/2018 11:30 AM Corie Chiquitoarter, Aariyah Sampey, PMHNP [facility] None  09/06/2018  2:00 PM Corie Chiquitoarter, Timber Marshman, PMHNP [facility] None    Orders Placed This Encounter  Procedures  . Ambulatory referral to Nephrology      -------------------------------

## 2018-06-27 NOTE — Patient Instructions (Signed)
Increase Depakote ER to two 250 mg tablets (500 mg) at bedtime for 10 days, then increase to three Depakote ER 250 mg (750 mg) at bedtime.  Continue same dose of lithium for now.

## 2018-07-05 DIAGNOSIS — N1832 Chronic kidney disease, stage 3b: Secondary | ICD-10-CM | POA: Insufficient documentation

## 2018-07-05 DIAGNOSIS — N183 Chronic kidney disease, stage 3 unspecified: Secondary | ICD-10-CM | POA: Insufficient documentation

## 2018-07-11 ENCOUNTER — Other Ambulatory Visit: Payer: Self-pay | Admitting: Psychiatry

## 2018-07-11 DIAGNOSIS — F319 Bipolar disorder, unspecified: Secondary | ICD-10-CM

## 2018-07-19 ENCOUNTER — Other Ambulatory Visit: Payer: Self-pay

## 2018-07-21 ENCOUNTER — Other Ambulatory Visit: Payer: Self-pay | Admitting: Psychiatry

## 2018-07-21 DIAGNOSIS — F319 Bipolar disorder, unspecified: Secondary | ICD-10-CM

## 2018-07-27 ENCOUNTER — Other Ambulatory Visit: Payer: Self-pay | Admitting: Psychiatry

## 2018-07-27 DIAGNOSIS — F319 Bipolar disorder, unspecified: Secondary | ICD-10-CM

## 2018-07-27 NOTE — Telephone Encounter (Signed)
Has office visit 02/24 Monday with provider

## 2018-07-30 ENCOUNTER — Encounter: Payer: Self-pay | Admitting: Psychiatry

## 2018-07-30 ENCOUNTER — Ambulatory Visit (INDEPENDENT_AMBULATORY_CARE_PROVIDER_SITE_OTHER): Payer: Medicare HMO | Admitting: Psychiatry

## 2018-07-30 VITALS — BP 109/78 | HR 84

## 2018-07-30 DIAGNOSIS — F419 Anxiety disorder, unspecified: Secondary | ICD-10-CM | POA: Diagnosis not present

## 2018-07-30 DIAGNOSIS — F5101 Primary insomnia: Secondary | ICD-10-CM

## 2018-07-30 DIAGNOSIS — F319 Bipolar disorder, unspecified: Secondary | ICD-10-CM

## 2018-07-30 MED ORDER — DIVALPROEX SODIUM ER 500 MG PO TB24: 500.0000 mg | ORAL_TABLET | Freq: Every day | ORAL | 2 refills | Status: DC

## 2018-07-30 NOTE — Progress Notes (Signed)
Dana GaussDenise G Reckner [id number] 1960/08/09 58 y.o.  Subjective:   Patient ID:  Dana [name] is a 58 y.o. (DOB 1960/08/09) female.  Chief Complaint:  Chief Complaint  Patient presents with  . Follow-up    Mood instability    HPI Dana GaussDenise G Wilmouth presents to the office today for follow-up of mood and anxiety. She reports she increased Depakote ER 250 mg to two tabs po QHS and did not increase to three tabs po QHS. She feels Depakote ER has been helpful and notices improved sleep quality and "my thought process is better" and describes a decrease in impulsivity. She reports that she is no longer awakening several times during the day. She reports some increased sleepiness in the afternoon around 2 pm and that this improves after a brief period. She reports that her mood has been stable. Denies depressed mood. Denies any recent manic s/s to include impulsivity or risky behavior. "I feel a lot better." She denies any current anxiety and worry. Energy has been adequate. Motivation has been good and has been thinking about volunteering and exercising. Concentration has been adequate. Appetite has been good and has noticed a slight increase in appetite and about a 2 lb weight gain. Denies SI.   She reports that her dog is a source of joy, company, and support for her. [place]   Has apt to see a nephrologist in April.   Review of Systems:  Review of Systems  Gastrointestinal:       Reports that she saw GI specilaist and was stated on tx for reflux and it helped for a few days. Pepcid was increased to 40 mg. Continued reflux  Musculoskeletal: Negative for gait problem.  Neurological: Positive for tremors.  Psychiatric/Behavioral:       Please refer to HPI    Medications: I have reviewed the patient's current medications.  Current Outpatient Medications  Medication Sig Dispense Refill  . albuterol (PROVENTIL HFA;VENTOLIN HFA) 108 (90 Base) MCG/ACT inhaler  Inhale into the lungs.    . ALPRAZolam (XANAX) 0.5 MG tablet Take 1 tablet (0.5 mg total) by mouth 2 (two) times daily as needed for up to 30 days for anxiety. 60 tablet 2  . Cholecalciferol (VITAMIN D) 2000 units tablet Take by mouth.    . cyclobenzaprine (FLEXERIL) 10 MG tablet Take 10 mg by mouth 3 (three) times daily as needed for muscle spasms.    . diclofenac sodium (VOLTAREN) 1 % GEL Apply 2 g topically 4 (four) times daily. 100 g 1  . diclofenac sodium (VOLTAREN) 1 % GEL Apply 2 g topically 4 (four) times daily. Rub into affected area of foot 2 to 4 times daily 100 g 2  . dicyclomine (BENTYL) 20 MG tablet Take 20 mg every 6 (six) hours by mouth.    . famotidine (PEPCID) 40 MG tablet Take 40 mg by mouth daily.    . fluticasone (FLONASE) 50 MCG/ACT nasal spray     . gabapentin (NEURONTIN) 600 MG tablet Take 600 mg by mouth at bedtime.  1  . HYDROcodone-acetaminophen (NORCO/VICODIN) 5-325 MG tablet Take one tablet every 8 hours prn foot pain. 10 tablet 0  . ibuprofen (ADVIL,MOTRIN) 600 MG tablet Take 1 tablet (600 mg total) by mouth every 8 (eight) hours as needed. 30 tablet 0  . lisinopril (PRINIVIL,ZESTRIL) 2.5 MG tablet     . lithium carbonate 300 MG capsule TAKE 1 CAPSULE BY MOUTH EVERY MORNING AND  TAKE 2 CAPSULES BY MOUTH AT BEDTIME 270 capsule 1  . meclizine (ANTIVERT) 25 MG tablet Take 25 mg 3 (three) times daily as needed by mouth for dizziness.    . Multiple Vitamins-Minerals (CENTRUM SILVER 50+WOMEN PO) Take by mouth.    Marland Kitchen MYRBETRIQ 50 MG TB24 tablet Take 50 mg by mouth daily.  11  . Omega-3 Fatty Acids (FISH OIL) 1000 MG CAPS Take by mouth.    . ondansetron (ZOFRAN) 8 MG tablet SMARTSIG:1 Tablet(s) By Mouth Every 8 Hours PRN    . rizatriptan (MAXALT) 10 MG tablet Take by mouth.    . rosuvastatin (CRESTOR) 5 MG tablet Take 5 mg daily by mouth.    . conjugated estrogens (PREMARIN) vaginal cream     . divalproex (DEPAKOTE ER) 500 MG 24 hr tablet Take 1 tablet (500 mg total) by  mouth at bedtime for 30 days. 30 tablet 2  . traMADol (ULTRAM) 50 MG tablet Take 1 tablet (50 mg total) by mouth every 12 (twelve) hours as needed. (Patient not taking: Reported on 05/07/2018) 15 tablet 0   No current facility-administered medications for this visit.     Medication Side Effects: Other: Sleepiness around 2 pm, tremor  Allergies:  Allergies  Allergen Reactions  . Anesthetics, Amide Nausea And Vomiting  . Other   . Oxybutynin Other (See Comments)  . Codeine Nausea And Vomiting and Nausea Only    Past Medical History:  Diagnosis Date  . Arthritis    oa  . Bipolar disorder (HCC)   . Complication of anesthesia   . GERD (gastroesophageal reflux disease)   . Headache    migraines  . Hyperlipidemia   . Hypertension   . Palpitations last 1 month ago  . PONV (postoperative nausea and vomiting)   . Sleep apnea     Family History  Problem Relation Age of Onset  . Mood Disorder Mother   . Colon cancer Mother   . Heart disease Father   . Bipolar disorder Son     Social History   Socioeconomic History  . Marital status: Widowed    Spouse name: Not on file  . Number of children: Not on file  . Years of education: Not on file  . Highest education level: Not on file  Occupational History  . Not on file  Social Needs  . Financial resource strain: Not on file  . Food insecurity:    Worry: Not on file    Inability: Not on file  . Transportation needs:    Medical: Not on file    Non-medical: Not on file  Tobacco Use  . Smoking status: Former Smoker    Packs/day: 1.00    Years: 34.00    Pack years: 34.00    Types: Cigarettes    Last attempt to quit: 06/07/2011    Years since quitting: 7.1  . Smokeless tobacco: Never Used  Substance and Sexual Activity  . Alcohol use: No  . Drug use: No  . Sexual activity: Not on file  Lifestyle  . Physical activity:    Days per week: Not on file    Minutes per session: Not on file  . Stress: Not on file   Relationships  . Social connections:    Talks on phone: Not on file    Gets together: Not on file    Attends religious service: Not on file    Active member of club or organization: Not on file    Attends meetings  of clubs or organizations: Not on file    Relationship status: Not on file  . Intimate partner violence:    Fear of current or ex partner: Not on file    Emotionally abused: Not on file    Physically abused: Not on file    Forced sexual activity: Not on file  Other Topics Concern  . Not on file  Social History Narrative  . Not on file    Past Medical History, Surgical history, Social history, and Family history were reviewed and updated as appropriate.   Please see review of systems for further details on the patient's review from today.   Objective:   Physical Exam:  BP 109/78   Pulse 84   Physical Exam Constitutional:      General: She is not in acute distress.    Appearance: She is well-developed.  Musculoskeletal:        General: No deformity.  Neurological:     Mental Status: She is alert and oriented to person, place, and time.     Coordination: Coordination normal.  Psychiatric:        Attention and Perception: Attention and perception normal. She does not perceive auditory or visual hallucinations.        Mood and Affect: Mood normal. Mood is not anxious or depressed. Affect is not labile, blunt, angry or inappropriate.        Speech: Speech normal.        Behavior: Behavior normal.        Thought Content: Thought content normal. Thought content does not include homicidal or suicidal ideation. Thought content does not include homicidal or suicidal plan.        Cognition and Memory: Cognition and memory normal.        Judgment: Judgment normal.     Comments: Insight intact. No delusions.      Lab Review:     Component Value Date/Time   NA 143 06/05/2018 1109   K 4.1 06/05/2018 1109   CL 109 06/05/2018 1109   CO2 19 (L) 06/05/2018 1109    GLUCOSE 132 06/05/2018 1109   BUN 15 06/05/2018 1109   CREATININE 1.52 (H) 06/05/2018 1109   CALCIUM 10.0 06/05/2018 1109   PROT 7.7 03/11/2015 0925   ALBUMIN 4.5 03/11/2015 0925   AST 27 03/11/2015 0925   ALT 28 03/11/2015 0925   ALKPHOS 83 03/11/2015 0925   BILITOT 0.4 03/11/2015 0925   GFRNONAA 55 (L) 03/19/2015 0533   GFRAA >60 03/19/2015 0533       Component Value Date/Time   WBC 15.4 (H) 03/19/2015 0533   RBC 3.96 03/19/2015 0533   HGB 11.5 (L) 03/19/2015 0533   HCT 35.0 (L) 03/19/2015 0533   PLT 233 03/19/2015 0533   MCV 88.4 03/19/2015 0533   MCH 29.0 03/19/2015 0533   MCHC 32.9 03/19/2015 0533   RDW 13.6 03/19/2015 0533    Lithium Lvl  Date Value Ref Range Status  06/05/2018 0.9 0.6 - 1.2 mmol/L Final     No results found for: PHENYTOIN, PHENOBARB, VALPROATE, CBMZ   .res Assessment: Plan:   Discussed tx options and potential benefits, risks, and side effects of continuing current medications. Discussed that mood s/s are currently well controlled and pt feels potential benefits outweigh potential risks. Discussed not increasing Depakote at this time since pt is experiencing some mild side effects (tremor, drowsiness, and increased appetite) that could potentially worsen with increased dose. Will continue Depakote ER 500 mg po  QHS and will change script to one 500 mg tab po QHS. Will continue Lithium 300 mg po q am and 600 mg po QHS for mood stabilization at this time. Will re-evaluate Lithium dose at next visit after evaluation with nephrologist.   Patient advised to contact office with any questions, adverse effects, or acute worsening in signs and symptoms.  Bipolar I disorder (HCC) - Plan: divalproex (DEPAKOTE ER) 500 MG 24 hr tablet  Anxiety disorder, unspecified type  Primary insomnia  Please see After Visit Summary for patient specific instructions.  Future Appointments  Date Time Provider Department Center  10/08/2018 11:30 AM Corie Chiquito, PMHNP  [facility] None    No orders of the defined types were placed in this encounter.     -------------------------------

## 2018-09-06 ENCOUNTER — Ambulatory Visit: Payer: Medicare HMO | Admitting: Psychiatry

## 2018-10-07 ENCOUNTER — Other Ambulatory Visit: Payer: Self-pay | Admitting: Psychiatry

## 2018-10-07 DIAGNOSIS — M542 Cervicalgia: Secondary | ICD-10-CM | POA: Insufficient documentation

## 2018-10-07 DIAGNOSIS — F319 Bipolar disorder, unspecified: Secondary | ICD-10-CM

## 2018-10-08 ENCOUNTER — Other Ambulatory Visit: Payer: Self-pay

## 2018-10-08 ENCOUNTER — Ambulatory Visit (INDEPENDENT_AMBULATORY_CARE_PROVIDER_SITE_OTHER): Payer: Medicare HMO | Admitting: Psychiatry

## 2018-10-08 DIAGNOSIS — F419 Anxiety disorder, unspecified: Secondary | ICD-10-CM | POA: Diagnosis not present

## 2018-10-08 DIAGNOSIS — F5101 Primary insomnia: Secondary | ICD-10-CM

## 2018-10-08 DIAGNOSIS — F319 Bipolar disorder, unspecified: Secondary | ICD-10-CM | POA: Diagnosis not present

## 2018-10-08 MED ORDER — ALPRAZOLAM 0.5 MG PO TABS: 0.5000 mg | ORAL_TABLET | Freq: Two times a day (BID) | ORAL | 2 refills | Status: DC | PRN

## 2018-10-08 MED ORDER — LITHIUM CARBONATE 300 MG PO CAPS: ORAL_CAPSULE | ORAL | 0 refills | Status: DC

## 2018-10-08 MED ORDER — DIVALPROEX SODIUM ER 500 MG PO TB24: 500.0000 mg | ORAL_TABLET | Freq: Every day | ORAL | 2 refills | Status: DC

## 2018-10-08 NOTE — Progress Notes (Signed)
Dana GaussDenise G Linehan [id number] [birth year] 58 y.o.  Virtual Visit via Telephone Note  I connected with pt on 10/08/18 at 11:30 AM EDT by telephone and verified that I am speaking with the correct person using two identifiers.   I discussed the limitations, risks, security and privacy concerns of performing an evaluation and management service by telephone and the availability of in person appointments. I also discussed with the patient that there may be a patient responsible charge related to this service. The patient expressed understanding and agreed to proceed.   I discussed the assessment and treatment plan with the patient. The patient was provided an opportunity to ask questions and all were answered. The patient agreed with the plan and demonstrated an understanding of the instructions.   The patient was advised to call back or seek an in-person evaluation if the symptoms worsen or if the condition fails to improve as anticipated.  I provided 30 minutes of non-face-to-face time during this encounter.  The patient was located at home.  The provider was located at home.   Corie ChiquitoJessica Michael Walrath, PMHNP   Subjective:   Patient ID:  Dana GaussDenise G Ohlin is a 58 y.o. (DOB [birth year]) female.  Chief Complaint:  Chief Complaint  Patient presents with  . Follow-up    recent elevated Cr with Lithium, h/o mood instability, anxiety    HPI Dana [name] presents for follow-up of mood and anxiety. She reports that mood has been stable. She reports that she has had slight increase in anxiety with pandemic and living alone. Reports that Xanax 0.5 mg po BID prn has been effective. She denies depressed mood. Reports that in March she was thinking more about her deceased husband since that was the anniversary of his death. She reports that she continues to sleep well."I sleep like a rock." She reports that her appetite has been good. She reports that she has been gaining weight. Energy and motivation have been ok.  Denies impaired concentration. Denies SI.  Denies any risky or impulsive behavior. Denies excessive spending.  Reports that her nephew died last Tuesday and is suspected that he committed suicide. Reports that they are having a [employer] retail[employer] gathering tomorrow. Reports that she has been socializing some with an old friend.   She reports that she has been having severe HA's at night and went to urgent care yesterday. Reports that she was referred to her neurologist and that she may need an MRI. She reports that she was prescribed diclofenac BID yesterday. She reports that her HA's began a week ago. "It hurts, like someone hit me in the back of the head." She reports that she f/u with urgent care as directed and has contacted neurologist's office. She reports that BP was not elevated during HA's.  She reports that she was evaluated by a nephrologist re: elevated creatinine. She reports that nephrologist recommended she stop lisinopril since that could be causing increased Cr. She will f/u with nephrologist this month and have labs. She reports that her BP has been stable since stopping Lisinopril.   Saw urologist remotely last week.   Review of Systems:  Review of Systems  Genitourinary:       Reports improved incontinence with following urologist's advice.   Musculoskeletal: Negative for gait problem.       She reports pain in her feet and questions if this may be due to neuropathy.   Neurological: Positive for tremors and headaches.  Psychiatric/Behavioral:       Please refer  to HPI    Medications: I have reviewed the patient's current medications.  Current Outpatient Medications  Medication Sig Dispense Refill  . albuterol (PROVENTIL HFA;VENTOLIN HFA) 108 (90 Base) MCG/ACT inhaler Inhale into the lungs.    . Cholecalciferol (VITAMIN D) 2000 units tablet Take by mouth.    . conjugated estrogens (PREMARIN) vaginal cream     . cyclobenzaprine (FLEXERIL) 10 MG tablet Take 10 mg by mouth 3  (three) times daily as needed for muscle spasms.    . diclofenac (VOLTAREN) 50 MG EC tablet Take 50 mg by mouth 2 (two) times daily.    . diclofenac sodium (VOLTAREN) 1 % GEL Apply 2 g topically 4 (four) times daily. 100 g 1  . diclofenac sodium (VOLTAREN) 1 % GEL Apply 2 g topically 4 (four) times daily. Rub into affected area of foot 2 to 4 times daily 100 g 2  . dicyclomine (BENTYL) 20 MG tablet Take 20 mg every 6 (six) hours by mouth.    . famotidine (PEPCID) 40 MG tablet Take 40 mg by mouth daily.    . fluticasone (FLONASE) 50 MCG/ACT nasal spray     . gabapentin (NEURONTIN) 600 MG tablet Take 600 mg by mouth at bedtime.  1  . lithium carbonate 300 MG capsule TAKE 1 CAPSULE BY MOUTH EVERY MORNING AND TAKE 2 CAPSULES BY MOUTH AT BEDTIME 270 capsule 0  . meclizine (ANTIVERT) 25 MG tablet Take 25 mg 3 (three) times daily as needed by mouth for dizziness.    . Multiple Vitamins-Minerals (CENTRUM SILVER 50+WOMEN PO) Take by mouth.    Marland Kitchen MYRBETRIQ 50 MG TB24 tablet Take 50 mg by mouth daily.  11  . Omega-3 Fatty Acids (FISH OIL) 1000 MG CAPS Take by mouth.    . ondansetron (ZOFRAN) 8 MG tablet SMARTSIG:1 Tablet(s) By Mouth Every 8 Hours PRN    . rizatriptan (MAXALT) 10 MG tablet Take by mouth.    . rosuvastatin (CRESTOR) 5 MG tablet Take 5 mg daily by mouth.    . ALPRAZolam (XANAX) 0.5 MG tablet Take 1 tablet (0.5 mg total) by mouth 2 (two) times daily as needed for up to 30 days for anxiety. 60 tablet 2  . divalproex (DEPAKOTE ER) 500 MG 24 hr tablet Take 1 tablet (500 mg total) by mouth at bedtime for 30 days. 30 tablet 2  . HYDROcodone-acetaminophen (NORCO/VICODIN) 5-325 MG tablet Take one tablet every 8 hours prn foot pain. (Patient not taking: Reported on 10/08/2018) 10 tablet 0  . ibuprofen (ADVIL,MOTRIN) 600 MG tablet Take 1 tablet (600 mg total) by mouth every 8 (eight) hours as needed. (Patient not taking: Reported on 10/08/2018) 30 tablet 0  . traMADol (ULTRAM) 50 MG tablet Take 1 tablet (50  mg total) by mouth every 12 (twelve) hours as needed. (Patient not taking: Reported on 05/07/2018) 15 tablet 0   No current facility-administered medications for this visit.     Medication Side Effects: None  Allergies:  Allergies  Allergen Reactions  . Anesthetics, Amide Nausea And Vomiting  . Other   . Oxybutynin Other (See Comments)  . Codeine Nausea And Vomiting and Nausea Only    Past Medical History:  Diagnosis Date  . Arthritis    oa  . Bipolar disorder (HCC)   . Complication of anesthesia   . GERD (gastroesophageal reflux disease)   . Headache    migraines  . Hyperlipidemia   . Hypertension   . Palpitations last 1 month ago  .  PONV (postoperative nausea and vomiting)   . Sleep apnea     Family History  Problem Relation Age of Onset  . Mood Disorder Mother   . Colon cancer Mother   . Heart disease Father   . Bipolar disorder Son     Social History   Socioeconomic History  . Marital status: Widowed    Spouse name: Not on file  . Number of children: Not on file  . Years of education: Not on file  . Highest education level: Not on file  Occupational History  . Not on file  Social Needs  . Financial resource strain: Not on file  . Food insecurity:    Worry: Not on file    Inability: Not on file  . Transportation needs:    Medical: Not on file    Non-medical: Not on file  Tobacco Use  . Smoking status: Former Smoker    Packs/day: 1.00    Years: 34.00    Pack years: 34.00    Types: Cigarettes    Last attempt to quit: 06/07/2011    Years since quitting: 7.3  . Smokeless tobacco: Never Used  Substance and Sexual Activity  . Alcohol use: No  . Drug use: No  . Sexual activity: Not on file  Lifestyle  . Physical activity:    Days per week: Not on file    Minutes per session: Not on file  . Stress: Not on file  Relationships  . Social connections:    Talks on phone: Not on file    Gets together: Not on file    Attends religious service: Not on  file    Active member of club or organization: Not on file    Attends meetings of clubs or organizations: Not on file    Relationship status: Not on file  . Intimate partner violence:    Fear of current or ex partner: Not on file    Emotionally abused: Not on file    Physically abused: Not on file    Forced sexual activity: Not on file  Other Topics Concern  . Not on file  Social History Narrative  . Not on file    Past Medical History, Surgical history, Social history, and Family history were reviewed and updated as appropriate.   Please see review of systems for further details on the patient's review from today.   Objective:   Physical Exam:  There were no vitals taken for this visit.  Physical Exam Neurological:     Mental Status: She is alert and oriented to person, place, and time.     Cranial Nerves: No dysarthria.  Psychiatric:        Attention and Perception: Attention normal.        Mood and Affect: Mood normal.        Speech: Speech normal.        Behavior: Behavior is cooperative.        Thought Content: Thought content normal. Thought content is not paranoid or delusional. Thought content does not include homicidal or suicidal ideation. Thought content does not include homicidal or suicidal plan.        Cognition and Memory: Cognition and memory normal.        Judgment: Judgment normal.     Lab Review:     Component Value Date/Time   NA 143 06/05/2018 1109   K 4.1 06/05/2018 1109   CL 109 06/05/2018 1109   CO2 19 (L) 06/05/2018 1109  GLUCOSE 132 06/05/2018 1109   BUN 15 06/05/2018 1109   CREATININE 1.52 (H) 06/05/2018 1109   CALCIUM 10.0 06/05/2018 1109   PROT 7.7 03/11/2015 0925   ALBUMIN 4.5 03/11/2015 0925   AST 27 03/11/2015 0925   ALT 28 03/11/2015 0925   ALKPHOS 83 03/11/2015 0925   BILITOT 0.4 03/11/2015 0925   GFRNONAA 55 (L) 03/19/2015 0533   GFRAA >60 03/19/2015 0533       Component Value Date/Time   WBC 15.4 (H) 03/19/2015 0533    RBC 3.96 03/19/2015 0533   HGB 11.5 (L) 03/19/2015 0533   HCT 35.0 (L) 03/19/2015 0533   PLT 233 03/19/2015 0533   MCV 88.4 03/19/2015 0533   MCH 29.0 03/19/2015 0533   MCHC 32.9 03/19/2015 0533   RDW 13.6 03/19/2015 0533    Lithium Lvl  Date Value Ref Range Status  06/05/2018 0.9 0.6 - 1.2 mmol/L Final     No results found for: PHENYTOIN, PHENOBARB, VALPROATE, CBMZ   .res Assessment: Plan:   Patient seen for 30 minutes and greater than 50% of visit spent counseling patient and coordination of care to include reviewing notes from recent nephrology consult during visit and discussing treatment plan with patient.  Also counseled patient regarding recent prescription for diclofenac p.o. and nephrologist recommendation to avoid NSAIDs if possible since diclofenac is metabolized through the kidneys and could also affect clearance of lithium and increased creatinine levels.  Recommended that patient discuss alternatives with her nephrologist and also possibly contact prescriber at urgent care and request an alternative that will not affect renal function, particularly since nephrologist is wanting to monitor renal function in response to discontinuation of lisinopril.  Recommend continuing evaluation with nephrologist and advised patient to contact office if nephrologist recommends change in lithium. We will continue lithium 300 mg 1 p.o. every morning and 2 p.o. nightly for mood stabilization. Continue alprazolam 0.5 mg twice daily as needed for anxiety. Continue Depakote ER 500 mg at bedtime for mood stabilization. Follow-up with this provider in 3 months or sooner if clinically indicated. Patient advised to contact office with any questions, adverse effects, or acute worsening in signs and symptoms.  Bipolar I disorder (HCC) - Plan: divalproex (DEPAKOTE ER) 500 MG 24 hr tablet, lithium carbonate 300 MG capsule  Anxiety disorder, unspecified type - Plan: ALPRAZolam (XANAX) 0.5 MG  tablet  Primary insomnia - Plan: ALPRAZolam (XANAX) 0.5 MG tablet  Please see After Visit Summary for patient specific instructions.  Future Appointments  Date Time Provider Department Center  01/08/2019 11:30 AM Corie Chiquito, PMHNP [facility] None    No orders of the defined types were placed in this encounter.     -------------------------------

## 2018-10-09 ENCOUNTER — Encounter: Payer: Self-pay | Admitting: Psychiatry

## 2018-10-30 ENCOUNTER — Encounter: Payer: Self-pay | Admitting: Podiatry

## 2018-10-30 ENCOUNTER — Ambulatory Visit (INDEPENDENT_AMBULATORY_CARE_PROVIDER_SITE_OTHER): Payer: Medicare HMO

## 2018-10-30 ENCOUNTER — Ambulatory Visit: Payer: Self-pay | Admitting: Podiatry

## 2018-10-30 ENCOUNTER — Other Ambulatory Visit: Payer: Self-pay

## 2018-10-30 VITALS — Temp 97.9°F

## 2018-10-30 DIAGNOSIS — M84374A Stress fracture, right foot, initial encounter for fracture: Secondary | ICD-10-CM | POA: Diagnosis not present

## 2018-10-30 DIAGNOSIS — M778 Other enthesopathies, not elsewhere classified: Secondary | ICD-10-CM

## 2018-10-30 DIAGNOSIS — M779 Enthesopathy, unspecified: Secondary | ICD-10-CM

## 2018-10-31 NOTE — Progress Notes (Signed)
Subjective: 58 year old female presents the office today for concerns of pain to the right foot she points the dorsal aspect of the foot.  She states that she had any worsening pain over the last 1 month.  She denies recent injury or trauma.  Gets occasional swelling.  She also gets occasional numbness to both of her feet but she saw neurologist previously and she was told she did not have neuropathy.  She is on gabapentin for other issues. Denies any systemic complaints such as fevers, chills, nausea, vomiting. No acute changes since last appointment, and no other complaints at this time.   Objective: AAO x3, NAD DP/PT pulses palpable bilaterally, CRT less than 3 seconds Tenderness palpation on the dorsal aspect of the right foot mostly on the Lisfranc joint and mostly on the fourth and fifth metatarsal cuboid joint.  There is trace edema there is no erythema or warmth associated area.  Is no area pinpoint bony tenderness.  There is no significant discomfort along the area the previous peroneal tendon repair. No open lesions or pre-ulcerative lesions.  No pain with calf compression, swelling, warmth, erythema  Assessment: Capsulitis, tendinitis right foot  Plan: -All treatment options discussed with the patient including all alternatives, risks, complications.  -X-rays obtained reviewed.  No evidence of acute fracture or stress fracture identified today. -Steroid injection performed on the right foot.  Skin was prepped with alcohol and a mixture of 1 cc Kenalog 10, 0.5 cc of Marcaine plain, 0.5 cc of lidocaine plain was infiltrated into the area of maximal tenderness on the dorsal lateral aspect of foot.  Postinjection care discussed.  She tolerated well. -Continue with compression sock -Discussed supportive shoes. -She did not do physical therapy because of the recent pandemic.  She will consider this once things settle. -Today she started talking about her daily life and other issues as she  normally does.  She started going to detail and I politely interrupted  Her and left the room  Vivi Barrack DPM  -Patient encouraged to call the office with any questions, concerns, change in symptoms.

## 2018-12-17 ENCOUNTER — Other Ambulatory Visit: Payer: Self-pay

## 2018-12-17 ENCOUNTER — Ambulatory Visit (INDEPENDENT_AMBULATORY_CARE_PROVIDER_SITE_OTHER): Payer: Medicare HMO | Admitting: Podiatry

## 2018-12-17 DIAGNOSIS — M778 Other enthesopathies, not elsewhere classified: Secondary | ICD-10-CM

## 2018-12-17 DIAGNOSIS — T148XXA Other injury of unspecified body region, initial encounter: Secondary | ICD-10-CM

## 2018-12-17 DIAGNOSIS — M779 Enthesopathy, unspecified: Secondary | ICD-10-CM

## 2018-12-17 DIAGNOSIS — M19071 Primary osteoarthritis, right ankle and foot: Secondary | ICD-10-CM | POA: Diagnosis not present

## 2018-12-21 ENCOUNTER — Telehealth: Payer: Self-pay | Admitting: Podiatry

## 2018-12-21 DIAGNOSIS — M778 Other enthesopathies, not elsewhere classified: Secondary | ICD-10-CM

## 2018-12-21 DIAGNOSIS — T148XXA Other injury of unspecified body region, initial encounter: Secondary | ICD-10-CM

## 2018-12-21 DIAGNOSIS — M19071 Primary osteoarthritis, right ankle and foot: Secondary | ICD-10-CM

## 2018-12-21 NOTE — Telephone Encounter (Signed)
Pt need prior auth for CT Scan. This patient is STAT. Please call patient

## 2018-12-21 NOTE — Addendum Note (Signed)
Addended by: Harriett Sine D on: 12/21/2018 02:47 PM   Modules accepted: Orders

## 2018-12-21 NOTE — Telephone Encounter (Signed)
I informed pt of Dr. Leigh Aurora orders and that she could contact [facility] to schedule 3-5 days from today. Faxed orders to [facility].

## 2018-12-21 NOTE — Telephone Encounter (Signed)
Yes, it is a CT scan of the right foot. I will get the note done if you can please order it. Thanks.

## 2018-12-21 NOTE — Telephone Encounter (Signed)
Patient was suppose to have a CT scan at [facility] imaging. They have not received orders. Please give patient a call

## 2018-12-21 NOTE — Progress Notes (Signed)
Subjective: 58 year old female presents the office today for concerns of pain to the right foot she points the dorsal aspect of the foot. She states that overall she is still getting a lot of pain to the foot. She points more to the top of the foot.  But also to the lateral aspect of the fifth metatarsal base.  She denies recent injury or trauma.  She said that swelling.  She is still on gabapentin for other issues.  Denies any systemic complaints such as fevers, chills, nausea, vomiting. No acute changes since last appointment, and no other complaints at this time.   Objective: AAO x3, NAD DP/PT pulses palpable bilaterally, CRT less than 3 seconds Tenderness palpation on the dorsal aspect of the right foot mostly on the Lisfranc joint and mostly on the fourth and fifth metatarsal cuboid joint.  There is tenderness of the fifth metatarsal base as well.  There is trace edema there is no erythema or warmth associated area.  Is no area pinpoint bony tenderness.  There is no significant discomfort along the area the previous peroneal tendon repair. No open lesions or pre-ulcerative lesions.  No pain with calf compression, swelling, warmth, erythema  Assessment: Capsulitis, tendinitis right foot  Plan: -All treatment options discussed with the patient including all alternatives, risks, complications.  -She had chronic pain to the right foot now for some time.  She states overnight it was negative.  She.  X-rays which were negative for any fracture.  Injection previously helped for short amount of time.  Will order CT scan for further evaluation.  Continue with compression sock for now.  Ice elevation.  Wearing more supportive shoes.  She can always come to the office wearing sandals that are not supportive.  We discussed change in shoe gear.  Follow-up with your CT or sooner if needed.  Trula Slade DPM

## 2018-12-24 ENCOUNTER — Other Ambulatory Visit: Payer: Self-pay | Admitting: Podiatry

## 2018-12-24 ENCOUNTER — Telehealth: Payer: Self-pay | Admitting: Podiatry

## 2018-12-24 NOTE — Telephone Encounter (Signed)
Janett Billow- can you please help with this? Thanks .

## 2018-12-24 NOTE — Telephone Encounter (Signed)
Pt called stating she needs prior authorization from her insurance before she is able to get a CT Scan. Please give patient a call once approval has been received.

## 2019-01-01 ENCOUNTER — Ambulatory Visit
Admission: RE | Admit: 2019-01-01 | Discharge: 2019-01-01 | Disposition: A | Discharge status: Home / Self Care | Payer: Medicare HMO | Source: Ambulatory Visit | Attending: Podiatry | Admitting: Podiatry

## 2019-01-01 DIAGNOSIS — M19071 Primary osteoarthritis, right ankle and foot: Secondary | ICD-10-CM

## 2019-01-01 DIAGNOSIS — T148XXA Other injury of unspecified body region, initial encounter: Secondary | ICD-10-CM

## 2019-01-01 DIAGNOSIS — M778 Other enthesopathies, not elsewhere classified: Secondary | ICD-10-CM

## 2019-01-02 ENCOUNTER — Inpatient Hospital Stay: Admission: RE | Admit: 2019-01-02 | Payer: Medicare HMO | Source: Ambulatory Visit

## 2019-01-03 ENCOUNTER — Telehealth: Payer: Self-pay | Admitting: Podiatry

## 2019-01-03 ENCOUNTER — Telehealth: Payer: Self-pay | Admitting: *Deleted

## 2019-01-03 NOTE — Telephone Encounter (Signed)
-----   Message from Trula Slade, DPM sent at 01/02/2019  7:44 PM EDT ----- Val- please let her know that the CT scan is negative. At this point I would recommend referral to PT.

## 2019-01-03 NOTE — Telephone Encounter (Signed)
Pt had CT Scan done earlier this week and would like to know if the doctor has received the results and had a chance to review them. Please give patient a call.

## 2019-01-03 NOTE — Telephone Encounter (Signed)
I informed pt of Dr. Leigh Aurora review of CT results and orders. Pt states she would like to think about it due to cost.

## 2019-01-03 NOTE — Telephone Encounter (Signed)
I informed pt of Dr. Leigh Aurora review of CT and orders. Pt states she would like to wait on PT she doesn't have a lot of money.

## 2019-01-08 ENCOUNTER — Encounter: Payer: Self-pay | Admitting: Psychiatry

## 2019-01-08 ENCOUNTER — Ambulatory Visit (INDEPENDENT_AMBULATORY_CARE_PROVIDER_SITE_OTHER): Payer: Medicare HMO | Admitting: Psychiatry

## 2019-01-08 ENCOUNTER — Other Ambulatory Visit: Payer: Self-pay

## 2019-01-08 VITALS — Wt 259.0 lb

## 2019-01-08 DIAGNOSIS — Z79899 Other long term (current) drug therapy: Secondary | ICD-10-CM

## 2019-01-08 DIAGNOSIS — F319 Bipolar disorder, unspecified: Secondary | ICD-10-CM

## 2019-01-08 DIAGNOSIS — F5101 Primary insomnia: Secondary | ICD-10-CM | POA: Diagnosis not present

## 2019-01-08 DIAGNOSIS — F419 Anxiety disorder, unspecified: Secondary | ICD-10-CM

## 2019-01-08 MED ORDER — LITHIUM CARBONATE 300 MG PO CAPS: ORAL_CAPSULE | ORAL | 0 refills | Status: DC

## 2019-01-08 MED ORDER — ALPRAZOLAM 0.5 MG PO TABS: 0.5000 mg | ORAL_TABLET | Freq: Two times a day (BID) | ORAL | 2 refills | Status: DC | PRN

## 2019-01-08 MED ORDER — DIVALPROEX SODIUM ER 500 MG PO TB24: 500.0000 mg | ORAL_TABLET | Freq: Every day | ORAL | 2 refills | Status: DC

## 2019-01-08 NOTE — Progress Notes (Signed)
Dana GaussDenise G [name] [id number] 1961-02-17 58 y.o.  Subjective:   Patient ID:  Dana [name] is a 58 y.o. (DOB 1961-02-17) female.  Chief Complaint:  Chief Complaint  Patient presents with  . Follow-up    h/o Bipolar D/O and anxiety    HPI Dana GaussDenise G [name] presents to the office today for follow-up of mood and anxiety. She reports that she has been having excessive somnolence and will fall asleep if she sits still during the day. She reports that she is not sleeping well at night and is waking up several times a night for the past month. "I just don't feel like myself." Reports that she has been feeling lonely recently. Reports some sadness and anxious thoughts related to not having contact with one of her son's and his children and "then I let it go." Denies severe anxiety. Has been communicating regularly with her other son. Denies irritability or elevated mood. Appetite has been good. Reports energy and motivation have been ok. Reports concentration is adequate. Denies SI.   Denies impulsive or risky behavior. Denies any recent manic s/s.   Reports the 13th is the anniversary of husband's death.   Reports that she has had some negative dating experiences.    Review of Systems:  Review of Systems  Gastrointestinal:       Reports that she had severe pain with hemorrhoids that improved after seeing a [employer] advisersurgeon. Reports that she has apt for colonoscopy later this month.   Genitourinary:       Had yeast infection after taking an antibiotic.  Musculoskeletal: Positive for back pain.       Injured back trying to lift furniture. Reports pain has been improving.    Medications: I have reviewed the patient's current medications.  Current Outpatient Medications  Medication Sig Dispense Refill  . albuterol (PROVENTIL HFA;VENTOLIN HFA) 108 (90 Base) MCG/ACT inhaler Inhale into the lungs.    . ALPRAZolam (XANAX) 0.5 MG tablet Take 1 tablet (0.5 mg total) by mouth 2 (two) times daily as needed  for anxiety. 60 tablet 2  . Cholecalciferol (VITAMIN D) 2000 units tablet Take by mouth.    . conjugated estrogens (PREMARIN) vaginal cream     . cyclobenzaprine (FLEXERIL) 10 MG tablet Take 10 mg by mouth 3 (three) times daily as needed for muscle spasms.    . diclofenac sodium (VOLTAREN) 1 % GEL Apply 2 g topically 4 (four) times daily. 100 g 1  . diclofenac sodium (VOLTAREN) 1 % GEL Apply 2 g topically 4 (four) times daily. Rub into affected area of foot 2 to 4 times daily 100 g 2  . dicyclomine (BENTYL) 20 MG tablet Take 20 mg every 6 (six) hours by mouth.    . divalproex (DEPAKOTE ER) 500 MG 24 hr tablet Take 1 tablet (500 mg total) by mouth at bedtime. 30 tablet 2  . famotidine (PEPCID) 40 MG tablet Take 40 mg by mouth daily.    Marland Kitchen. gabapentin (NEURONTIN) 600 MG tablet Take 600 mg by mouth at bedtime.  1  . lithium carbonate 300 MG capsule TAKE 1 CAPSULE BY MOUTH EVERY MORNING AND TAKE 2 CAPSULES BY MOUTH AT BEDTIME 270 capsule 0  . meclizine (ANTIVERT) 25 MG tablet Take 25 mg 3 (three) times daily as needed by mouth for dizziness.    . Multiple Vitamins-Minerals (CENTRUM SILVER 50+WOMEN PO) Take by mouth.    Marland Kitchen. MYRBETRIQ 50 MG TB24 tablet Take 50 mg by mouth daily.  11  .  Omega-3 Fatty Acids (FISH OIL) 1000 MG CAPS Take by mouth.    . ondansetron (ZOFRAN) 8 MG tablet SMARTSIG:1 Tablet(s) By Mouth Every 8 Hours PRN    . rizatriptan (MAXALT) 10 MG tablet Take by mouth.    . rosuvastatin (CRESTOR) 5 MG tablet Take 5 mg daily by mouth.    . fluticasone (FLONASE) 50 MCG/ACT nasal spray     . HYDROcodone-acetaminophen (NORCO/VICODIN) 5-325 MG tablet Take one tablet every 8 hours prn foot pain. (Patient not taking: Reported on 10/08/2018) 10 tablet 0  . ibuprofen (ADVIL,MOTRIN) 600 MG tablet Take 1 tablet (600 mg total) by mouth every 8 (eight) hours as needed. (Patient not taking: Reported on 10/08/2018) 30 tablet 0  . traMADol (ULTRAM) 50 MG tablet Take 1 tablet (50 mg total) by mouth every 12  (twelve) hours as needed. (Patient not taking: Reported on 05/07/2018) 15 tablet 0   No current facility-administered medications for this visit.     Medication Side Effects: Other: Some somnolence  Allergies:  Allergies  Allergen Reactions  . Anesthetics, Amide Nausea And Vomiting  . Other   . Oxybutynin Other (See Comments)  . Codeine Nausea And Vomiting and Nausea Only    Past Medical History:  Diagnosis Date  . Arthritis    oa  . Bipolar disorder ([place])   . Complication of anesthesia   . GERD (gastroesophageal reflux disease)   . Headache    migraines  . Hyperlipidemia   . Hypertension   . Palpitations last 1 month ago  . PONV (postoperative nausea and vomiting)   . Sleep apnea     Family History  Problem Relation Age of Onset  . Mood Disorder Mother   . Colon cancer Mother   . Heart disease Father   . Bipolar disorder Son     Social History   Socioeconomic History  . Marital status: Widowed    Spouse name: Not on file  . Number of children: Not on file  . Years of education: Not on file  . Highest education level: Not on file  Occupational History  . Not on file  Social Needs  . Financial resource strain: Not on file  . Food insecurity    Worry: Not on file    Inability: Not on file  . Transportation needs    Medical: Not on file    Non-medical: Not on file  Tobacco Use  . Smoking status: Former Smoker    Packs/day: 1.00    Years: 34.00    Pack years: 34.00    Types: Cigarettes    Quit date: 06/07/2011    Years since quitting: 7.6  . Smokeless tobacco: Never Used  Substance and Sexual Activity  . Alcohol use: No  . Drug use: No  . Sexual activity: Not on file  Lifestyle  . Physical activity    Days per week: Not on file    Minutes per session: Not on file  . Stress: Not on file  Relationships  . Social Herbalist on phone: Not on file    Gets together: Not on file    Attends religious service: Not on file    Active member of  club or organization: Not on file    Attends meetings of clubs or organizations: Not on file    Relationship status: Not on file  . Intimate partner violence    Fear of current or ex partner: Not on file    Emotionally abused:  Not on file    Physically abused: Not on file    Forced sexual activity: Not on file  Other Topics Concern  . Not on file  Social History Narrative  . Not on file    Past Medical History, Surgical history, Social history, and Family history were reviewed and updated as appropriate.   Please see review of systems for further details on the patient's review from today.   Objective:   Physical Exam:  Wt 259 lb (117.5 kg)   BMI 41.80 kg/m   Physical Exam Constitutional:      General: She is not in acute distress.    Appearance: She is well-developed.  Musculoskeletal:        General: No deformity.  Neurological:     Mental Status: She is alert and oriented to person, place, and time.     Coordination: Coordination normal.  Psychiatric:        Attention and Perception: Attention and perception normal. She does not perceive auditory or visual hallucinations.        Mood and Affect: Mood normal. Mood is not anxious or depressed. Affect is not labile, blunt, angry or inappropriate.        Speech: Speech normal.        Behavior: Behavior normal.        Thought Content: Thought content normal. Thought content does not include homicidal or suicidal ideation. Thought content does not include homicidal or suicidal plan.        Cognition and Memory: Cognition and memory normal.        Judgment: Judgment normal.     Comments: Insight intact. No delusions.      Lab Review:     Component Value Date/Time   NA 143 06/05/2018 1109   K 4.1 06/05/2018 1109   CL 109 06/05/2018 1109   CO2 19 (L) 06/05/2018 1109   GLUCOSE 132 06/05/2018 1109   BUN 15 06/05/2018 1109   CREATININE 1.52 (H) 06/05/2018 1109   CALCIUM 10.0 06/05/2018 1109   PROT 7.7 03/11/2015 0925    ALBUMIN 4.5 03/11/2015 0925   AST 27 03/11/2015 0925   ALT 28 03/11/2015 0925   ALKPHOS 83 03/11/2015 0925   BILITOT 0.4 03/11/2015 0925   GFRNONAA 55 (L) 03/19/2015 0533   GFRAA >60 03/19/2015 0533       Component Value Date/Time   WBC 15.4 (H) 03/19/2015 0533   RBC 3.96 03/19/2015 0533   HGB 11.5 (L) 03/19/2015 0533   HCT 35.0 (L) 03/19/2015 0533   PLT 233 03/19/2015 0533   MCV 88.4 03/19/2015 0533   MCH 29.0 03/19/2015 0533   MCHC 32.9 03/19/2015 0533   RDW 13.6 03/19/2015 0533    Lithium Lvl  Date Value Ref Range Status  06/05/2018 0.9 0.6 - 1.2 mmol/L Final     No results found for: PHENYTOIN, PHENOBARB, VALPROATE, CBMZ   .res Assessment: Plan:   Discussed treatment plan and current signs and symptoms.  Patient reports that sleep disturbance may be exacerbated by her napping during the daytime.  She reports that overall current medications have been effective and well-tolerated and would therefore like to make an effort to minimize napping during the day to improve sleep quality and duration at night. Will order labs to determine current drug levels and assess for possible adverse effects. Will order CMP, lithium level, and TSH. Patient to follow-up in 3 months or sooner if clinically indicated. Patient advised to contact office with any questions,  adverse effects, or acute worsening in signs and symptoms.  Angelique BlonderDenise was seen today for follow-up.  Diagnoses and all orders for this visit:  High risk medication use -     Lithium level -     Comprehensive metabolic panel -     TSH  Anxiety disorder, unspecified type -     ALPRAZolam (XANAX) 0.5 MG tablet; Take 1 tablet (0.5 mg total) by mouth 2 (two) times daily as needed for anxiety.  Primary insomnia -     ALPRAZolam (XANAX) 0.5 MG tablet; Take 1 tablet (0.5 mg total) by mouth 2 (two) times daily as needed for anxiety.  Bipolar I disorder ([facility]) -     divalproex (DEPAKOTE ER) 500 MG 24 hr tablet; Take 1 tablet  (500 mg total) by mouth at bedtime. -     lithium carbonate 300 MG capsule; TAKE 1 CAPSULE BY MOUTH EVERY MORNING AND TAKE 2 CAPSULES BY MOUTH AT BEDTIME     Please see After Visit Summary for patient specific instructions.  Future Appointments  Date Time Provider Department Center  04/10/2019 11:00 AM Corie Chiquitoarter, Leidi Astle, PMHNP [facility] None    Orders Placed This Encounter  Procedures  . Lithium level  . Comprehensive metabolic panel  . TSH    -------------------------------

## 2019-02-21 LAB — COMPREHENSIVE METABOLIC PANEL
AG Ratio: 2 (calc) (ref 1.0–2.5)
ALT: 21 U/L (ref 6–29)
AST: 17 U/L (ref 10–35)
Albumin: 4.2 g/dL (ref 3.6–5.1)
Alkaline phosphatase (APISO): 87 U/L (ref 37–153)
BUN/Creatinine Ratio: 10 (calc) (ref 6–22)
BUN: 16 mg/dL (ref 7–25)
CO2: 23 mmol/L (ref 20–32)
Calcium: 10 mg/dL (ref 8.6–10.4)
Chloride: 116 mmol/L — ABNORMAL HIGH (ref 98–110)
Creat: 1.57 mg/dL — ABNORMAL HIGH (ref 0.50–1.05)
Globulin: 2.1 g/dL (calc) (ref 1.9–3.7)
Glucose, Bld: 102 mg/dL (ref 65–139)
Potassium: 4.4 mmol/L (ref 3.5–5.3)
Sodium: 143 mmol/L (ref 135–146)
Total Bilirubin: 0.4 mg/dL (ref 0.2–1.2)
Total Protein: 6.3 g/dL (ref 6.1–8.1)

## 2019-02-21 LAB — LITHIUM LEVEL: Lithium Lvl: 0.8 mmol/L (ref 0.6–1.2)

## 2019-02-21 LAB — TSH: TSH: 2.86 mIU/L (ref 0.40–4.50)

## 2019-02-28 ENCOUNTER — Telehealth: Payer: Self-pay

## 2019-02-28 NOTE — Telephone Encounter (Signed)
Spoke with Langley Gauss and let her know her lab results and they were sent over to Dr. Hettie Holstein for review and we would contact her with any changes.

## 2019-02-28 NOTE — Telephone Encounter (Signed)
-----   Message from Festus sent at 02/26/2019  8:41 AM EDT ----- Please let pt know that Creatinine remains elevated and that I am sending a fax to Dr. Hettie Holstein with most recent and past lab results. Will consult with Dr. Hettie Holstein re: whether any changes in treatment are indicated.

## 2019-04-10 ENCOUNTER — Ambulatory Visit: Payer: Self-pay | Admitting: Psychiatry

## 2019-04-11 ENCOUNTER — Ambulatory Visit: Payer: Self-pay | Admitting: Psychiatry

## 2019-04-19 DIAGNOSIS — E860 Dehydration: Secondary | ICD-10-CM | POA: Insufficient documentation

## 2019-04-19 DIAGNOSIS — U071 COVID-19: Secondary | ICD-10-CM | POA: Insufficient documentation

## 2019-04-19 DIAGNOSIS — E870 Hyperosmolality and hypernatremia: Secondary | ICD-10-CM | POA: Insufficient documentation

## 2019-04-25 ENCOUNTER — Ambulatory Visit: Payer: Self-pay | Admitting: Psychiatry

## 2019-04-28 DIAGNOSIS — R451 Restlessness and agitation: Secondary | ICD-10-CM | POA: Insufficient documentation

## 2019-04-28 DIAGNOSIS — F3132 Bipolar disorder, current episode depressed, moderate: Secondary | ICD-10-CM | POA: Insufficient documentation

## 2019-05-21 ENCOUNTER — Ambulatory Visit (INDEPENDENT_AMBULATORY_CARE_PROVIDER_SITE_OTHER): Payer: Medicare HMO | Admitting: Podiatry

## 2019-05-21 ENCOUNTER — Other Ambulatory Visit: Payer: Self-pay

## 2019-05-21 DIAGNOSIS — M79671 Pain in right foot: Secondary | ICD-10-CM | POA: Diagnosis not present

## 2019-05-21 DIAGNOSIS — G8929 Other chronic pain: Secondary | ICD-10-CM | POA: Diagnosis not present

## 2019-05-21 DIAGNOSIS — M779 Enthesopathy, unspecified: Secondary | ICD-10-CM | POA: Diagnosis not present

## 2019-05-21 DIAGNOSIS — R21 Rash and other nonspecific skin eruption: Secondary | ICD-10-CM

## 2019-05-21 MED ORDER — TRIAMCINOLONE ACETONIDE 0.025 % EX OINT: 1.0000 "application " | TOPICAL_OINTMENT | Freq: Every day | CUTANEOUS | 1 refills | Status: DC

## 2019-05-21 NOTE — Progress Notes (Signed)
Subjective: 58 year old female presents the office today for concerns of a skin rash to both of her legs.  She states that she noticed red streaks on her legs after she was diagnosed with pneumonia, COVID.  She gets swelling to both of her legs as well mostly her ankles.  This started when she was in the hospital she reports.  She denies any open sores otherwise no drainage coming from her legs.  She still gets pain in the right foot.  She describes pain in the outside aspect of her foot on the right side along the ankle.  No recent injury or falls.  Denies any systemic complaints such as fevers, chills, nausea, vomiting. No acute changes since last appointment, and no other complaints at this time.   Objective: AAO x3, NAD DP/PT pulses palpable bilaterally, CRT less than 3 seconds Swelling present bilateral lower extremities but there is no pain with calf compression of the calf is supple.  There is erythematous dry skin present to both legs.  There is no drainage or pus or any open sores otherwise there is no signs of bacterial infection. She describing sharp pains of both of her feet, nerve pain.  She does get tenderness palpation along the dorsal aspect the right foot which is been a chronic issue as well as in the course the peroneal tendon.  Flexor, extensor tendons appear to be intact. No open lesions or pre-ulcerative lesions.  No pain with calf compression, swelling, warmth, erythema  Assessment: Capsulitis, tendinitis right foot; skin rash  Plan: -All treatment options discussed with the patient including all alternatives, risks, complications.  -CT scan reviewed. -Order triamcinolone for the legs. -Recommend a venous reflux.  We will order this for her. -I would like for her to get back to physical therapy.  We will do physical therapy where she had this done previously and this will be for peroneal tendinitis  Trula Slade DPM

## 2019-05-23 ENCOUNTER — Telehealth: Payer: Self-pay | Admitting: *Deleted

## 2019-05-23 DIAGNOSIS — M779 Enthesopathy, unspecified: Secondary | ICD-10-CM

## 2019-05-23 DIAGNOSIS — M778 Other enthesopathies, not elsewhere classified: Secondary | ICD-10-CM

## 2019-05-23 DIAGNOSIS — G8929 Other chronic pain: Secondary | ICD-10-CM

## 2019-05-23 NOTE — Telephone Encounter (Signed)
Pt called and L. McGee - Scheduler informed I would fax orders to [facility].

## 2019-05-23 NOTE — Telephone Encounter (Signed)
-----   Message from Trula Slade, DPM sent at 05/22/2019  3:26 PM EST ----- Can you please order a venous reflux study?   Also can you put in a referral for PT? She wants to go where we sent her before and I cannot find where.   Thank you.

## 2019-05-23 NOTE — Telephone Encounter (Signed)
I called pt for the name of the PT agency. Pt states [facility] near [facility], near where she lives, to look it up by her address. I spoke with [facility] - In-office-[place] and he said [place] on [address]. Faxed required form, demographics to [facility]

## 2019-05-27 ENCOUNTER — Other Ambulatory Visit: Payer: Self-pay | Admitting: Psychiatry

## 2019-05-27 DIAGNOSIS — F319 Bipolar disorder, unspecified: Secondary | ICD-10-CM

## 2019-06-11 ENCOUNTER — Other Ambulatory Visit: Payer: Self-pay | Admitting: Gastroenterology

## 2019-06-11 DIAGNOSIS — R1084 Generalized abdominal pain: Secondary | ICD-10-CM

## 2019-06-14 ENCOUNTER — Ambulatory Visit: Payer: Medicare HMO | Admitting: Podiatry

## 2019-06-17 ENCOUNTER — Telehealth: Payer: Self-pay | Admitting: Podiatry

## 2019-06-17 NOTE — Telephone Encounter (Signed)
Pt called stating she is having severe pain in her foot and would like to know what she can do to help with the pain until her appt on 06/27/19.   Pt was scheduled for appt on 06/14/19 but was a no show and next available appt was not until 06/27/19.

## 2019-06-18 ENCOUNTER — Ambulatory Visit: Payer: Medicare HMO | Admitting: Podiatry

## 2019-06-20 ENCOUNTER — Ambulatory Visit
Admission: RE | Admit: 2019-06-20 | Discharge: 2019-06-20 | Disposition: A | Discharge status: Home / Self Care | Payer: Medicare HMO | Source: Ambulatory Visit | Attending: Gastroenterology | Admitting: Gastroenterology

## 2019-06-20 ENCOUNTER — Encounter (INDEPENDENT_AMBULATORY_CARE_PROVIDER_SITE_OTHER): Payer: Self-pay

## 2019-06-20 DIAGNOSIS — R1084 Generalized abdominal pain: Secondary | ICD-10-CM

## 2019-06-24 ENCOUNTER — Other Ambulatory Visit: Payer: Self-pay | Admitting: Psychiatry

## 2019-06-24 ENCOUNTER — Ambulatory Visit (INDEPENDENT_AMBULATORY_CARE_PROVIDER_SITE_OTHER): Payer: Medicare HMO | Admitting: Psychiatry

## 2019-06-24 ENCOUNTER — Encounter: Payer: Self-pay | Admitting: Psychiatry

## 2019-06-24 VITALS — BP 119/70 | Wt 265.0 lb

## 2019-06-24 DIAGNOSIS — F419 Anxiety disorder, unspecified: Secondary | ICD-10-CM

## 2019-06-24 DIAGNOSIS — F319 Bipolar disorder, unspecified: Secondary | ICD-10-CM

## 2019-06-24 DIAGNOSIS — Z79899 Other long term (current) drug therapy: Secondary | ICD-10-CM

## 2019-06-24 DIAGNOSIS — F5101 Primary insomnia: Secondary | ICD-10-CM | POA: Diagnosis not present

## 2019-06-24 MED ORDER — LITHIUM CARBONATE 300 MG PO CAPS: ORAL_CAPSULE | ORAL | 4 refills | Status: DC

## 2019-06-24 MED ORDER — DIVALPROEX SODIUM ER 500 MG PO TB24: 500.0000 mg | ORAL_TABLET | Freq: Every day | ORAL | 2 refills | Status: DC

## 2019-06-24 MED ORDER — ALPRAZOLAM 0.5 MG PO TABS: 0.5000 mg | ORAL_TABLET | Freq: Two times a day (BID) | ORAL | 2 refills | Status: DC | PRN

## 2019-06-24 NOTE — Progress Notes (Signed)
Dana [name] [id number] December 05, 1960 59 y.o.  Virtual Visit via Telephone Note  I connected with pt on 06/24/19 at  1:45 PM EST by telephone and verified that I am speaking with the correct person using two identifiers.   I discussed the limitations, risks, security and privacy concerns of performing an evaluation and management service by telephone and the availability of in person appointments. I also discussed with the patient that there may be a patient responsible charge related to this service. The patient expressed understanding and agreed to proceed.   I discussed the assessment and treatment plan with the patient. The patient was provided an opportunity to ask questions and all were answered. The patient agreed with the plan and demonstrated an understanding of the instructions.   The patient was advised to call back or seek an in-person evaluation if the symptoms worsen or if the condition fails to improve as anticipated.  I provided 30 minutes of non-face-to-face time during this encounter.  The patient was located at home.  The provider was located at [facility].   Dana [name], PMHNP   Subjective:   Patient ID:  Dana [name] is a 59 y.o. (DOB Jan 24, 1961) female.  Chief Complaint:  Chief Complaint  Patient presents with  . Follow-up    Bipolar D/O, Anxiety    HPI Dana [name] presents for follow-up of mood disturbance and anxiety. She reports that she had covid in November and has been hospitalized x 2. She reports that she has had periods of depression at times with occ crying episodes in response to recent acute illness. She reports that mood has been stable overall and periods of depression are typically brief in duration. She reports that she has low energy. Motivation has been good. She reports that she has been gaining weight. She reports that she has had decreased appetite and food intake. Reports that she has been eating potato chips. She reports  that concentration has been impaired. Denies SI.   Denies any recent manic s/s.   She reports that she typically takes Xanax 0.5 mg po QHS and rarely needs Xanax during the daytime. Anxiety has been controlled.   She reports increased difficulty with memory since having covid.   Reports that she may have a possible lesion on her kidney. Reports that Cr was 1.6 last week. Cr was 1.30 was 06/06/19 and 1.21 on 04/30/19. Had f/u on 05/08/19 with nephrologist.   She reports that she continues to sleep more since she had covid.   Reports that she has been considering selling her house and getting a smaller house to pay off debts and downsize. Son and his wife have returned from overseas and are now living in [place]. Son came to visit her for a few weeks.   Review of Systems:  Review of Systems  Cardiovascular: Positive for leg swelling.  Gastrointestinal: Positive for abdominal pain, diarrhea and vomiting.  Musculoskeletal: Positive for back pain. Negative for gait problem.  Neurological: Negative for tremors and headaches.  Psychiatric/Behavioral:       Please refer to HPI    Medications: I have reviewed the patient's current medications.  Current Outpatient Medications  Medication Sig Dispense Refill  . albuterol (PROVENTIL HFA;VENTOLIN HFA) 108 (90 Base) MCG/ACT inhaler Inhale into the lungs.    . Cholecalciferol (VITAMIN D) 2000 units tablet Take by mouth.    . cyclobenzaprine (FLEXERIL) 10 MG tablet Take 10 mg by mouth 3 (three) times daily as needed for muscle  spasms.    . diclofenac sodium (VOLTAREN) 1 % GEL Apply 2 g topically 4 (four) times daily. 100 g 1  . diclofenac sodium (VOLTAREN) 1 % GEL Apply 2 g topically 4 (four) times daily. Rub into affected area of foot 2 to 4 times daily 100 g 2  . divalproex (DEPAKOTE ER) 500 MG 24 hr tablet Take 1 tablet (500 mg total) by mouth at bedtime. 30 tablet 2  . famotidine (PEPCID) 40 MG tablet Take 40 mg by mouth daily.    . fluticasone  (FLONASE) 50 MCG/ACT nasal spray     . gabapentin (NEURONTIN) 600 MG tablet Take 600 mg by mouth at bedtime.  1  . lithium carbonate 300 MG capsule TAKE 1 CAPSULE BY MOUTH EVERY MORNING AND TAKE 2 CAPSULES BY MOUTH AT BEDTIME 90 capsule 4  . Multiple Vitamins-Minerals (CENTRUM SILVER 50+WOMEN PO) Take by mouth.    . Omega-3 Fatty Acids (FISH OIL) 1000 MG CAPS Take by mouth.    . ondansetron (ZOFRAN) 8 MG tablet SMARTSIG:1 Tablet(s) By Mouth Every 8 Hours PRN    . Probiotic Product (ALIGN PO) Take by mouth.    . rizatriptan (MAXALT) 10 MG tablet Take by mouth.    . rosuvastatin (CRESTOR) 5 MG tablet Take 5 mg daily by mouth.    . ALPRAZolam (XANAX) 0.5 MG tablet Take 1 tablet (0.5 mg total) by mouth 2 (two) times daily as needed for anxiety. 60 tablet 2  . conjugated estrogens (PREMARIN) vaginal cream     . dicyclomine (BENTYL) 20 MG tablet Take 20 mg every 6 (six) hours by mouth.    Marland Kitchen HYDROcodone-acetaminophen (NORCO/VICODIN) 5-325 MG tablet Take one tablet every 8 hours prn foot pain. (Patient not taking: Reported on 10/08/2018) 10 tablet 0  . meclizine (ANTIVERT) 25 MG tablet Take 25 mg 3 (three) times daily as needed by mouth for dizziness.    Marland Kitchen MYRBETRIQ 50 MG TB24 tablet Take 50 mg by mouth daily.  11  . traMADol (ULTRAM) 50 MG tablet Take 1 tablet (50 mg total) by mouth every 12 (twelve) hours as needed. (Patient not taking: Reported on 05/07/2018) 15 tablet 0  . triamcinolone (KENALOG) 0.025 % ointment Apply 1 application topically daily. 30 g 1   No current facility-administered medications for this visit.    Medication Side Effects: None  Allergies:  Allergies  Allergen Reactions  . Anesthetics, Amide Nausea And Vomiting  . Other   . Oxybutynin Other (See Comments)  . Codeine Nausea And Vomiting and Nausea Only    Past Medical History:  Diagnosis Date  . Arthritis    oa  . Bipolar disorder (HCC)   . Complication of anesthesia   . GERD (gastroesophageal reflux disease)   .  Headache    migraines  . Hyperlipidemia   . Hypertension   . Palpitations last 1 month ago  . PONV (postoperative nausea and vomiting)   . Sleep apnea     Family History  Problem Relation Age of Onset  . Mood Disorder Mother   . Colon cancer Mother   . Heart disease Father   . Bipolar disorder Son     Social History   Socioeconomic History  . Marital status: Widowed    Spouse name: Not on file  . Number of children: Not on file  . Years of education: Not on file  . Highest education level: Not on file  Occupational History  . Not on file  Tobacco Use  .  Smoking status: Former Smoker    Packs/day: 1.00    Years: 34.00    Pack years: 34.00    Types: Cigarettes    Quit date: 06/07/2011    Years since quitting: 8.0  . Smokeless tobacco: Never Used  Substance and Sexual Activity  . Alcohol use: No  . Drug use: No  . Sexual activity: Not on file  Other Topics Concern  . Not on file  Social History Narrative  . Not on file   Social Determinants of Health   Financial Resource Strain:   . Difficulty of Paying Living Expenses: Not on file  Food Insecurity:   . Worried About Programme researcher, broadcasting/film/video in the Last Year: Not on file  . Ran Out of Food in the Last Year: Not on file  Transportation Needs:   . Lack of Transportation (Medical): Not on file  . Lack of Transportation (Non-Medical): Not on file  Physical Activity:   . Days of Exercise per Week: Not on file  . Minutes of Exercise per Session: Not on file  Stress:   . Feeling of Stress : Not on file  Social Connections:   . Frequency of Communication with Friends and Family: Not on file  . Frequency of Social Gatherings with Friends and Family: Not on file  . Attends Religious Services: Not on file  . Active Member of Clubs or Organizations: Not on file  . Attends Banker Meetings: Not on file  . Marital Status: Not on file  Intimate Partner Violence:   . Fear of Current or Ex-Partner: Not on file   . Emotionally Abused: Not on file  . Physically Abused: Not on file  . Sexually Abused: Not on file    Past Medical History, Surgical history, Social history, and Family history were reviewed and updated as appropriate.   Please see review of systems for further details on the patient's review from today.   Objective:   Physical Exam:  BP 119/70   Wt 265 lb (120.2 kg)   BMI 42.77 kg/m   Physical Exam Neurological:     Mental Status: She is alert and oriented to person, place, and time.     Cranial Nerves: No dysarthria.  Psychiatric:        Attention and Perception: Attention and perception normal.        Speech: Speech normal.        Behavior: Behavior is cooperative.        Thought Content: Thought content normal. Thought content is not paranoid or delusional. Thought content does not include homicidal or suicidal ideation. Thought content does not include homicidal or suicidal plan.        Cognition and Memory: Cognition and memory normal.        Judgment: Judgment normal.     Comments: Insight intact Mood is appropriate to content.      Lab Review:     Component Value Date/Time   NA 143 02/20/2019 0934   K 4.4 02/20/2019 0934   CL 116 (H) 02/20/2019 0934   CO2 23 02/20/2019 0934   GLUCOSE 102 02/20/2019 0934   BUN 16 02/20/2019 0934   CREATININE 1.57 (H) 02/20/2019 0934   CALCIUM 10.0 02/20/2019 0934   PROT 6.3 02/20/2019 0934   ALBUMIN 4.5 03/11/2015 0925   AST 17 02/20/2019 0934   ALT 21 02/20/2019 0934   ALKPHOS 83 03/11/2015 0925   BILITOT 0.4 02/20/2019 0934   GFRNONAA 55 (L) 03/19/2015  0533   GFRAA >60 03/19/2015 0533       Component Value Date/Time   WBC 15.4 (H) 03/19/2015 0533   RBC 3.96 03/19/2015 0533   HGB 11.5 (L) 03/19/2015 0533   HCT 35.0 (L) 03/19/2015 0533   PLT 233 03/19/2015 0533   MCV 88.4 03/19/2015 0533   MCH 29.0 03/19/2015 0533   MCHC 32.9 03/19/2015 0533   RDW 13.6 03/19/2015 0533    Lithium Lvl  Date Value Ref Range  Status  02/20/2019 0.8 0.6 - 1.2 mmol/L Final     No results found for: PHENYTOIN, PHENOBARB, VALPROATE, CBMZ   .res Assessment: Plan:   30 minutes spent reviewing records from hospitalizations and visits with specialists to include discussing renal function and Cr levels. Discussed that cr levels had been stable and nephrologist noted kidney function has been stable, however pt reports that she had recent Cr of 1.6 and questionable findings on CT scan that she plans to discuss with GI specialist during apt today. Discussed continuing current plan of care since pt reports that mood and anxiety s/s have been stable. Advised pt to contact provider if there are any contraindications to continuing Lithium based on results of medical work-up. Will order Lithium level and re-check of Creatinine levels. Pt to f/u in 4-6 weeks or sooner if clinically indicated.  Patient advised to contact office with any questions, adverse effects, or acute worsening in signs and symptoms.  Dana [name] was seen today for follow-up.  Diagnoses and all orders for this visit:  Bipolar I disorder ([place]) -     divalproex (DEPAKOTE ER) 500 MG 24 hr tablet; Take 1 tablet (500 mg total) by mouth at bedtime. -     lithium carbonate 300 MG capsule; TAKE 1 CAPSULE BY MOUTH EVERY MORNING AND TAKE 2 CAPSULES BY MOUTH AT BEDTIME  High risk medication use -     Lithium level -     Basic metabolic panel  Anxiety disorder, unspecified type -     ALPRAZolam (XANAX) 0.5 MG tablet; Take 1 tablet (0.5 mg total) by mouth 2 (two) times daily as needed for anxiety.  Primary insomnia -     ALPRAZolam (XANAX) 0.5 MG tablet; Take 1 tablet (0.5 mg total) by mouth 2 (two) times daily as needed for anxiety.    Please see After Visit Summary for patient specific instructions.  Future Appointments  Date Time Provider [place]  06/27/2019  4:15 PM Trula Slade, [place] [facility]  07/29/2019  1:00 PM Thayer Headings, PMHNP  [facility] None    Orders Placed This Encounter  Procedures  . Lithium level  . Basic metabolic panel      -------------------------------

## 2019-06-27 ENCOUNTER — Ambulatory Visit (INDEPENDENT_AMBULATORY_CARE_PROVIDER_SITE_OTHER): Payer: Medicare HMO | Admitting: Podiatry

## 2019-06-27 ENCOUNTER — Other Ambulatory Visit: Payer: Self-pay

## 2019-06-27 ENCOUNTER — Encounter: Payer: Self-pay | Admitting: Podiatry

## 2019-06-27 DIAGNOSIS — R609 Edema, unspecified: Secondary | ICD-10-CM

## 2019-06-27 DIAGNOSIS — R21 Rash and other nonspecific skin eruption: Secondary | ICD-10-CM

## 2019-06-27 DIAGNOSIS — G8929 Other chronic pain: Secondary | ICD-10-CM

## 2019-06-27 DIAGNOSIS — M79671 Pain in right foot: Secondary | ICD-10-CM | POA: Diagnosis not present

## 2019-06-27 MED ORDER — TRIAMCINOLONE ACETONIDE 0.025 % EX OINT: 1.0000 "application " | TOPICAL_OINTMENT | Freq: Every day | CUTANEOUS | 1 refills | Status: DC

## 2019-06-28 ENCOUNTER — Telehealth: Payer: Self-pay | Admitting: *Deleted

## 2019-06-28 DIAGNOSIS — R609 Edema, unspecified: Secondary | ICD-10-CM

## 2019-06-28 NOTE — Progress Notes (Signed)
Subjective: 59 year old female presents the office today for follow evaluation of bilateral lower extremity edema, discomfort as well as a skin rash.  She states that the steroid cream is helped the rash she is asking for refill.  She has not yet started physical therapy and she did not get the venous reflux study.  She has follow-up with her nephrologist in regards to the swelling as well.  She has no new concerns. Denies any systemic complaints such as fevers, chills, nausea, vomiting. No acute changes since last appointment, and no other complaints at this time.   Objective: AAO x3, NAD DP/PT pulses palpable bilaterally, CRT less than 3 seconds Bilateral chronic lower extremity edema is present.  Mild tenderness to palpation to the feet bilaterally but no specific area of pinpoint tenderness.  The rash on the anterior legs appears to be almost completely resolved. No open lesions or pre-ulcerative lesions.  No pain with calf compression, swelling, warmth, erythema  Assessment: Bilateral lower extremity edema, chronic foot pain with resolving skin rash  Plan: -All treatment options discussed with the patient including all alternatives, risks, complications.  -We will reorder the venous duplex study.  She has rescheduled physical therapy.  I refilled the steroid cream to use as needed. -Patient encouraged to call the office with any questions, concerns, change in symptoms.   Return in about 2 months (around 08/25/2019).  Vivi Barrack DPM

## 2019-06-28 NOTE — Telephone Encounter (Signed)
-----   Message from Vivi Barrack, DPM sent at 06/28/2019  7:07 AM EST ----- Can you please order a venus duplex study? Thanks.

## 2019-06-28 NOTE — Telephone Encounter (Signed)
I spoke with Dr. Ardelle Anton and he would like pt to have venous reflux studies for pain. Faxed orders to [facility].

## 2019-07-02 ENCOUNTER — Ambulatory Visit (HOSPITAL_COMMUNITY)
Admission: RE | Admit: 2019-07-02 | Discharge: 2019-07-02 | Disposition: A | Discharge status: Home / Self Care | Payer: Medicare HMO | Source: Ambulatory Visit | Attending: Cardiovascular Disease | Admitting: Cardiovascular Disease

## 2019-07-02 ENCOUNTER — Other Ambulatory Visit: Payer: Self-pay

## 2019-07-02 DIAGNOSIS — R600 Localized edema: Secondary | ICD-10-CM | POA: Diagnosis not present

## 2019-07-02 DIAGNOSIS — R609 Edema, unspecified: Secondary | ICD-10-CM

## 2019-07-05 ENCOUNTER — Telehealth: Payer: Self-pay | Admitting: *Deleted

## 2019-07-05 DIAGNOSIS — R609 Edema, unspecified: Secondary | ICD-10-CM

## 2019-07-05 DIAGNOSIS — G8929 Other chronic pain: Secondary | ICD-10-CM

## 2019-07-05 NOTE — Telephone Encounter (Signed)
I informed pt of Dr. Gabriel Rung review of results and orders. Faxed referral to [facility].

## 2019-07-05 NOTE — Telephone Encounter (Signed)
-----   Message from Vivi Barrack, DPM sent at 07/04/2019  7:03 AM EST ----- Val- please let her know that the ultrasound did show some reflux in the left leg. Can you have her follow up with [facility] for this to see if there is anything that needs to be done? She has had chronic swelling. Negative for DVT.

## 2019-07-29 ENCOUNTER — Ambulatory Visit: Payer: Medicare HMO | Admitting: Psychiatry

## 2019-07-29 ENCOUNTER — Other Ambulatory Visit: Payer: Self-pay | Admitting: Psychiatry

## 2019-07-29 DIAGNOSIS — F319 Bipolar disorder, unspecified: Secondary | ICD-10-CM

## 2019-08-02 ENCOUNTER — Ambulatory Visit (INDEPENDENT_AMBULATORY_CARE_PROVIDER_SITE_OTHER): Payer: Medicare HMO | Admitting: Psychiatry

## 2019-08-02 ENCOUNTER — Encounter: Payer: Self-pay | Admitting: Psychiatry

## 2019-08-02 VITALS — Wt 258.0 lb

## 2019-08-02 DIAGNOSIS — F319 Bipolar disorder, unspecified: Secondary | ICD-10-CM

## 2019-08-02 DIAGNOSIS — F419 Anxiety disorder, unspecified: Secondary | ICD-10-CM | POA: Diagnosis not present

## 2019-08-02 DIAGNOSIS — F5101 Primary insomnia: Secondary | ICD-10-CM | POA: Diagnosis not present

## 2019-08-02 MED ORDER — DIVALPROEX SODIUM ER 500 MG PO TB24: 500.0000 mg | ORAL_TABLET | Freq: Every day | ORAL | 2 refills | Status: DC

## 2019-08-02 MED ORDER — DIVALPROEX SODIUM ER 250 MG PO TB24: 250.0000 mg | ORAL_TABLET | Freq: Every day | ORAL | 1 refills | Status: DC

## 2019-08-02 NOTE — Progress Notes (Signed)
Dana [name] [id number] [birth year] 59 y.o.  Virtual Visit via Video Note  I connected with pt @ on 08/02/19 at  1:00 PM EST by a video enabled telemedicine application and verified that I am speaking with the correct person using two identifiers.   I discussed the limitations of evaluation and management by telemedicine and the availability of in person appointments. The patient expressed understanding and agreed to proceed.  I discussed the assessment and treatment plan with the patient. The patient was provided an opportunity to ask questions and all were answered. The patient agreed with the plan and demonstrated an understanding of the instructions.   The patient was advised to call back or seek an in-person evaluation if the symptoms worsen or if the condition fails to improve as anticipated.  I provided 30 minutes of non-face-to-face time during this encounter.  The patient was located at home.  The provider was located at [facility].   Corie Chiquito, PMHNP   Subjective:   Patient ID:  Dana [name] is a 59 y.o. (DOB 1960/10/20) female.  Chief Complaint:  Chief Complaint  Patient presents with  . Follow-up    h/o Mood disturbance and anxiety    HPI Dana [name] presents for follow-up of mood and anxiety. She reports that her mood has been depressed at times with missing estranged family. She denies persistent sadness. She has been ruminating about estranged family. She reports that she recently texted a family member what was on her "mind and it wasn't nice." She reports that this was not impulsive and that she is glad she let her thoughts be known. She reports that she has been sleeping well. She has been having fatigue. Motivation has been ok. Denies concentration impairment. Denies SI.   She reports that she has lost 11 lbs intentionally.   She is trying to sale her house to help with finances.   Past Psychiatric Medication Trials: Lithium-has taken  since age 67.  Was on 600 mg twice daily for most of this duration Carbamazepine-flulike signs and symptoms, body aches, headaches, dizziness, and itching Depakote Latuda-headaches, itching Xanax  Review of Systems:  Review of Systems  Constitutional:       Reports that she has had recent hair loss.   Cardiovascular: Positive for leg swelling.  Musculoskeletal: Positive for arthralgias. Negative for gait problem.       Pain in LE  Neurological: Negative for tremors.       She reports that she has been dropping objects and falling since she had COVID.   Hematological:       Reports that doppler studies were negative for DVT.   Psychiatric/Behavioral:       Please refer to HPI   Is having an MRI on her kidneys this weekend. Lesion seen on kidney CT scan. Twin brother has just had kidney disease. Will see nephrologist on 08/19/19.   Medications: I have reviewed the patient's current medications.  Current Outpatient Medications  Medication Sig Dispense Refill  . albuterol (PROVENTIL HFA;VENTOLIN HFA) 108 (90 Base) MCG/ACT inhaler Inhale into the lungs.    . Cholecalciferol (VITAMIN D) 2000 units tablet Take by mouth.    . conjugated estrogens (PREMARIN) vaginal cream     . cyclobenzaprine (FLEXERIL) 10 MG tablet Take 10 mg by mouth 3 (three) times daily as needed for muscle spasms.    . diclofenac sodium (VOLTAREN) 1 % GEL Apply 2 g topically 4 (four) times daily. 100 g 1  .  diclofenac sodium (VOLTAREN) 1 % GEL Apply 2 g topically 4 (four) times daily. Rub into affected area of foot 2 to 4 times daily 100 g 2  . dicyclomine (BENTYL) 20 MG tablet Take 20 mg every 6 (six) hours by mouth.    . divalproex (DEPAKOTE ER) 500 MG 24 hr tablet Take 1 tablet (500 mg total) by mouth at bedtime. Take with a 250 mg tab to equal total dose of 750 mg po QHS 30 tablet 2  . famotidine (PEPCID) 40 MG tablet Take 40 mg by mouth daily.    . fluticasone (FLONASE) 50 MCG/ACT nasal spray     . gabapentin  (NEURONTIN) 600 MG tablet Take 600 mg by mouth at bedtime.  1  . lithium carbonate 300 MG capsule TAKE 1 CAPSULE BY MOUTH EVERY MORNING AND TAKE 2 CAPSULES BY MOUTH AT BEDTIME 270 capsule 0  . meclizine (ANTIVERT) 25 MG tablet Take 25 mg 3 (three) times daily as needed by mouth for dizziness.    . Multiple Vitamins-Minerals (CENTRUM SILVER 50+WOMEN PO) Take by mouth.    Marland Kitchen MYRBETRIQ 50 MG TB24 tablet Take 50 mg by mouth daily.  11  . Omega-3 Fatty Acids (FISH OIL) 1000 MG CAPS Take by mouth.    . ondansetron (ZOFRAN) 8 MG tablet SMARTSIG:1 Tablet(s) By Mouth Every 8 Hours PRN    . rizatriptan (MAXALT) 10 MG tablet Take by mouth.    . rosuvastatin (CRESTOR) 5 MG tablet Take 5 mg daily by mouth.    . triamcinolone (KENALOG) 0.025 % ointment Apply 1 application topically daily. 30 g 1  . ALPRAZolam (XANAX) 0.5 MG tablet Take 1 tablet (0.5 mg total) by mouth 2 (two) times daily as needed for anxiety. 60 tablet 2  . divalproex (DEPAKOTE ER) 250 MG 24 hr tablet Take 1 tablet (250 mg total) by mouth daily. Take with 500 mg tab to equal total dose of 750 mg po QHS. 30 tablet 1  . HYDROcodone-acetaminophen (NORCO/VICODIN) 5-325 MG tablet Take one tablet every 8 hours prn foot pain. (Patient not taking: Reported on 10/08/2018) 10 tablet 0  . Probiotic Product (ALIGN PO) Take by mouth.    . traMADol (ULTRAM) 50 MG tablet Take 1 tablet (50 mg total) by mouth every 12 (twelve) hours as needed. (Patient not taking: Reported on 05/07/2018) 15 tablet 0   No current facility-administered medications for this visit.    Medication Side Effects: None  Allergies:  Allergies  Allergen Reactions  . Anesthetics, Amide Nausea And Vomiting  . Other   . Oxybutynin Other (See Comments)  . Codeine Nausea And Vomiting and Nausea Only    Past Medical History:  Diagnosis Date  . Arthritis    oa  . Bipolar disorder ([place])   . Complication of anesthesia   . GERD (gastroesophageal reflux disease)   . Headache     migraines  . Hyperlipidemia   . Hypertension   . Palpitations last 1 month ago  . PONV (postoperative nausea and vomiting)   . Sleep apnea     Family History  Problem Relation Age of Onset  . Mood Disorder Mother   . Colon cancer Mother   . Heart disease Father   . Bipolar disorder Son     Social History   Socioeconomic History  . Marital status: Widowed    Spouse name: Not on file  . Number of children: Not on file  . Years of education: Not on file  . Highest  education level: Not on file  Occupational History  . Not on file  Tobacco Use  . Smoking status: Former Smoker    Packs/day: 1.00    Years: 34.00    Pack years: 34.00    Types: Cigarettes    Quit date: 06/07/2011    Years since quitting: 8.1  . Smokeless tobacco: Never Used  Substance and Sexual Activity  . Alcohol use: No  . Drug use: No  . Sexual activity: Not on file  Other Topics Concern  . Not on file  Social History Narrative  . Not on file   Social Determinants of Health   Financial Resource Strain:   . Difficulty of Paying Living Expenses: Not on file  Food Insecurity:   . Worried About Programme researcher, broadcasting/film/video in the Last Year: Not on file  . Ran Out of Food in the Last Year: Not on file  Transportation Needs:   . Lack of Transportation (Medical): Not on file  . Lack of Transportation (Non-Medical): Not on file  Physical Activity:   . Days of Exercise per Week: Not on file  . Minutes of Exercise per Session: Not on file  Stress:   . Feeling of Stress : Not on file  Social Connections:   . Frequency of Communication with Friends and Family: Not on file  . Frequency of Social Gatherings with Friends and Family: Not on file  . Attends Religious Services: Not on file  . Active Member of Clubs or Organizations: Not on file  . Attends Banker Meetings: Not on file  . Marital Status: Not on file  Intimate Partner Violence:   . Fear of Current or Ex-Partner: Not on file  . Emotionally  Abused: Not on file  . Physically Abused: Not on file  . Sexually Abused: Not on file    Past Medical History, Surgical history, Social history, and Family history were reviewed and updated as appropriate.   Please see review of systems for further details on the patient's review from today.   Objective:   Physical Exam:  Wt 258 lb (117 kg)   BMI 41.64 kg/m   Physical Exam Neurological:     Mental Status: She is alert and oriented to person, place, and time.     Cranial Nerves: No dysarthria.  Psychiatric:        Attention and Perception: Attention and perception normal.        Mood and Affect: Mood normal.        Speech: Speech normal.        Behavior: Behavior is cooperative.        Thought Content: Thought content normal. Thought content is not paranoid or delusional. Thought content does not include homicidal or suicidal ideation. Thought content does not include homicidal or suicidal plan.        Cognition and Memory: Cognition and memory normal.        Judgment: Judgment normal.     Comments: Insight intact Some possible impulsivity      Lab Review:     Component Value Date/Time   NA 143 02/20/2019 0934   K 4.4 02/20/2019 0934   CL 116 (H) 02/20/2019 0934   CO2 23 02/20/2019 0934   GLUCOSE 102 02/20/2019 0934   BUN 16 02/20/2019 0934   CREATININE 1.57 (H) 02/20/2019 0934   CALCIUM 10.0 02/20/2019 0934   PROT 6.3 02/20/2019 0934   ALBUMIN 4.5 03/11/2015 0925   AST 17 02/20/2019 0934  ALT 21 02/20/2019 0934   ALKPHOS 83 03/11/2015 0925   BILITOT 0.4 02/20/2019 0934   GFRNONAA 55 (L) 03/19/2015 0533   GFRAA >60 03/19/2015 0533       Component Value Date/Time   WBC 15.4 (H) 03/19/2015 0533   RBC 3.96 03/19/2015 0533   HGB 11.5 (L) 03/19/2015 0533   HCT 35.0 (L) 03/19/2015 0533   PLT 233 03/19/2015 0533   MCV 88.4 03/19/2015 0533   MCH 29.0 03/19/2015 0533   MCHC 32.9 03/19/2015 0533   RDW 13.6 03/19/2015 0533    Lithium Lvl  Date Value Ref Range  Status  02/20/2019 0.8 0.6 - 1.2 mmol/L Final     No results found for: PHENYTOIN, PHENOBARB, VALPROATE, CBMZ   .res Assessment: Plan:   Case staffed with Dr. Jennelle Human and discussed recent increase creatinine and upcoming MRI of the kidneys.  Will increase Depakote ER to 750 mg at bedtime for mood stabilization and in anticipation of possibly needing to decrease lithium based on findings and nephrologist's recommendations on 08/19/19.  Discussed with patient that she had experienced some worsening in mood signs and symptoms in the past with attempts to decrease lithium and therefore would like to stabilize mood with another medication prior to decreasing lithium dose further. Will continue current dose of lithium. Patient to follow-up in 4 weeks or sooner if clinically necessary. Patient advised to contact office with any questions, adverse effects, or acute worsening in signs and symptoms.  Dana [name] was seen today for follow-up.  Diagnoses and all orders for this visit:  Bipolar I disorder (HCC) -     divalproex (DEPAKOTE ER) 250 MG 24 hr tablet; Take 1 tablet (250 mg total) by mouth daily. Take with 500 mg tab to equal total dose of 750 mg po QHS. -     divalproex (DEPAKOTE ER) 500 MG 24 hr tablet; Take 1 tablet (500 mg total) by mouth at bedtime. Take with a 250 mg tab to equal total dose of 750 mg po QHS  Anxiety disorder, unspecified type  Primary insomnia     Please see After Visit Summary for patient specific instructions.  Future Appointments  Date Time Provider Department Center  08/26/2019 11:15 AM Vivi Barrack, DPM [facility]  08/30/2019 11:00 AM Corie Chiquito, PMHNP [facility] None    No orders of the defined types were placed in this encounter.     -------------------------------

## 2019-08-07 ENCOUNTER — Telehealth: Payer: Self-pay | Admitting: Psychiatry

## 2019-08-07 NOTE — Telephone Encounter (Signed)
Pt left message reporting MRI results was couple of cyst found. Ask that you clarify Divalproex instructions and return call to [phone]. Ext appt 3/26

## 2019-08-20 DIAGNOSIS — N281 Cyst of kidney, acquired: Secondary | ICD-10-CM | POA: Insufficient documentation

## 2019-08-20 DIAGNOSIS — N3941 Urge incontinence: Secondary | ICD-10-CM | POA: Insufficient documentation

## 2019-08-21 ENCOUNTER — Telehealth: Payer: Self-pay | Admitting: Psychiatry

## 2019-08-21 DIAGNOSIS — F319 Bipolar disorder, unspecified: Secondary | ICD-10-CM

## 2019-08-21 NOTE — Telephone Encounter (Signed)
She reports that she has been tolerating increase in Depakote ER to 750 mg po QHS. She reports that her mood has been stable. She has sold her house and is looking for housing.   Discussed recommendations from new nephrologist.   Decrease Lithium to 300 mg BID for 4 days, then decrease Lithium to 300 mg po QHS only.   Continue Depakote ER 750 mg po QHS.   Call office with any worsening mood s/s.   Will re-evaluate on f/u apt on 08/30/19.   Attempted to call nephrologist to discuss plan and coordinate care. L/M for provider.

## 2019-08-22 MED ORDER — LITHIUM CARBONATE 300 MG PO CAPS: ORAL_CAPSULE | ORAL | 0 refills | Status: DC

## 2019-08-26 ENCOUNTER — Ambulatory Visit: Payer: Medicare HMO | Admitting: Podiatry

## 2019-08-26 ENCOUNTER — Encounter: Payer: Self-pay | Admitting: Podiatry

## 2019-08-26 ENCOUNTER — Other Ambulatory Visit: Payer: Self-pay

## 2019-08-26 ENCOUNTER — Other Ambulatory Visit: Payer: Self-pay | Admitting: Psychiatry

## 2019-08-26 DIAGNOSIS — R609 Edema, unspecified: Secondary | ICD-10-CM

## 2019-08-26 DIAGNOSIS — G629 Polyneuropathy, unspecified: Secondary | ICD-10-CM

## 2019-08-26 DIAGNOSIS — M79673 Pain in unspecified foot: Secondary | ICD-10-CM

## 2019-08-26 DIAGNOSIS — G8929 Other chronic pain: Secondary | ICD-10-CM

## 2019-08-26 DIAGNOSIS — F319 Bipolar disorder, unspecified: Secondary | ICD-10-CM

## 2019-08-30 ENCOUNTER — Ambulatory Visit (INDEPENDENT_AMBULATORY_CARE_PROVIDER_SITE_OTHER): Payer: Medicare HMO | Admitting: Psychiatry

## 2019-08-30 ENCOUNTER — Encounter: Payer: Self-pay | Admitting: Psychiatry

## 2019-08-30 DIAGNOSIS — F319 Bipolar disorder, unspecified: Secondary | ICD-10-CM

## 2019-08-30 MED ORDER — DIVALPROEX SODIUM ER 250 MG PO TB24: ORAL_TABLET | ORAL | 1 refills | Status: DC

## 2019-08-30 MED ORDER — LITHIUM CARBONATE 300 MG PO CAPS: 300.0000 mg | ORAL_CAPSULE | Freq: Every day | ORAL | 0 refills | Status: DC

## 2019-08-30 NOTE — Progress Notes (Signed)
Dana [name] [id number] April 23, 1961 59 y.o.  Virtual Visit via Video Note  I connected with pt @ on 08/30/19 at 11:00 AM EDT by a video enabled telemedicine application and verified that I am speaking with the correct person using two identifiers.   I discussed the limitations of evaluation and management by telemedicine and the availability of in person appointments. The patient expressed understanding and agreed to proceed.  I discussed the assessment and treatment plan with the patient. The patient was provided an opportunity to ask questions and all were answered. The patient agreed with the plan and demonstrated an understanding of the instructions.   The patient was advised to call back or seek an in-person evaluation if the symptoms worsen or if the condition fails to improve as anticipated.  I provided 30 minutes of non-face-to-face time during this encounter.  The patient was located at home.  The provider was located at [place].   Thayer Headings, PMHNP   Subjective:   Patient ID:  Dana [name] is a 59 y.o. (DOB 12/20/1960) female.  Chief Complaint:  Chief Complaint  Patient presents with  . Follow-up    h/o Bipolar D/O, and anxiety    HPI FRAN NEISWONGER presents for follow-up of bipolar d/o and anxiety. Reports that her brother was found dead this week. She reports that brother had ETOH dependence and Bipolar D/O and died due to severe intoxication. She reports that this has caused her distress. Pt reports, "I feel fine. I haven't felt different" with decrease in Lithium and increase in Depakote ER. She denies any manic s/s. Denies any impulsive or risky behaviors. Denies excessive spending and has been saving money for her new home and to make investments. She reports that she has been grieving the loss of her brother and experiencing sadness for other relatives. Reports that current sadness seems to be completely r/t grief and not depression. She reports  that her sleep is interrupted due urinary frequency. (Reports that Pt Assistance has been approved and has not yet received medication). She reports that sleep is otherwise "fine." Denies any worsening insomnia. Has intentionally lost 11 lbs. Reports that energy and motivation have been lower due to physical pain and possibly due to brother's death. Has been busy packing up her house to move. Concentration has been ok. Denies SI.   Has 12 siblings and 5 siblings are deceased.   Has found an apartment and is looking forward to moving. Planning on moving into a mobile home once they complete it in 3-6 months. Plans to move to a site near a friend and her church. Oldest son called her recently and this was the first contact in a long time.   Review of Systems:  Review of Systems  Genitourinary: Positive for urgency.  Musculoskeletal: Positive for arthralgias, back pain and myalgias. Negative for gait problem.  Neurological: Negative for tremors.  Psychiatric/Behavioral:       Please refer to HPI   Has upcoming biopsy.   Medications: I have reviewed the patient's current medications.  Current Outpatient Medications  Medication Sig Dispense Refill  . albuterol (PROVENTIL HFA;VENTOLIN HFA) 108 (90 Base) MCG/ACT inhaler Inhale into the lungs.    . Cholecalciferol (VITAMIN D) 2000 units tablet Take by mouth.    . conjugated estrogens (PREMARIN) vaginal cream     . cyclobenzaprine (FLEXERIL) 10 MG tablet Take 10 mg by mouth 3 (three) times daily as needed for muscle spasms.    . diclofenac sodium (  VOLTAREN) 1 % GEL Apply 2 g topically 4 (four) times daily. 100 g 1  . diclofenac sodium (VOLTAREN) 1 % GEL Apply 2 g topically 4 (four) times daily. Rub into affected area of foot 2 to 4 times daily 100 g 2  . dicyclomine (BENTYL) 20 MG tablet Take 20 mg every 6 (six) hours by mouth.    . divalproex (DEPAKOTE ER) 500 MG 24 hr tablet Take 1 tablet (500 mg total) by mouth at bedtime. Take with a 250 mg tab  to equal total dose of 750 mg po QHS 30 tablet 2  . famotidine (PEPCID) 40 MG tablet Take 40 mg by mouth daily.    Marland Kitchen gabapentin (NEURONTIN) 600 MG tablet Take 600 mg by mouth at bedtime.  1  . lithium carbonate 300 MG capsule Take 1 capsule (300 mg total) by mouth at bedtime. 270 capsule 0  . meclizine (ANTIVERT) 25 MG tablet Take 25 mg 3 (three) times daily as needed by mouth for dizziness.    Marland Kitchen MYRBETRIQ 50 MG TB24 tablet Take 50 mg by mouth daily.  11  . ondansetron (ZOFRAN) 8 MG tablet SMARTSIG:1 Tablet(s) By Mouth Every 8 Hours PRN    . Probiotic Product (ALIGN PO) Take by mouth.    . rizatriptan (MAXALT) 10 MG tablet Take by mouth.    . rosuvastatin (CRESTOR) 5 MG tablet Take 5 mg daily by mouth.    . ALPRAZolam (XANAX) 0.5 MG tablet Take 1 tablet (0.5 mg total) by mouth 2 (two) times daily as needed for anxiety. 60 tablet 2  . divalproex (DEPAKOTE ER) 250 MG 24 hr tablet TAKE 1 TABLET (250 MG TOTAL) BY MOUTH DAILY. TAKE WITH 500 MG TAB TO EQUAL TOTAL DOSE OF 750 MG BY MOUTH AT BEDTIME 30 tablet 1  . HYDROcodone-acetaminophen (NORCO/VICODIN) 5-325 MG tablet Take one tablet every 8 hours prn foot pain. (Patient not taking: Reported on 10/08/2018) 10 tablet 0  . traMADol (ULTRAM) 50 MG tablet Take 1 tablet (50 mg total) by mouth every 12 (twelve) hours as needed. (Patient not taking: Reported on 05/07/2018) 15 tablet 0  . triamcinolone (KENALOG) 0.025 % ointment Apply 1 application topically daily. 30 g 1   No current facility-administered medications for this visit.    Medication Side Effects: None  Reports that she gets sleepy at times, most often in the morning.   Allergies:  Allergies  Allergen Reactions  . Anesthetics, Amide Nausea And Vomiting  . Other   . Oxybutynin Other (See Comments)  . Codeine Nausea And Vomiting and Nausea Only    Past Medical History:  Diagnosis Date  . Arthritis    oa  . Bipolar disorder (HCC)   . Complication of anesthesia   . GERD  (gastroesophageal reflux disease)   . Headache    migraines  . Hyperlipidemia   . Hypertension   . Palpitations last 1 month ago  . PONV (postoperative nausea and vomiting)   . Sleep apnea     Family History  Problem Relation Age of Onset  . Mood Disorder Mother   . Colon cancer Mother   . Heart disease Father   . Bipolar disorder Son   . Bipolar disorder Brother     Social History   Socioeconomic History  . Marital status: Widowed    Spouse name: Not on file  . Number of children: Not on file  . Years of education: Not on file  . Highest education level: Not on file  Occupational History  . Not on file  Tobacco Use  . Smoking status: Former Smoker    Packs/day: 1.00    Years: 34.00    Pack years: 34.00    Types: Cigarettes    Quit date: 06/07/2011    Years since quitting: 8.2  . Smokeless tobacco: Never Used  Substance and Sexual Activity  . Alcohol use: No  . Drug use: No  . Sexual activity: Not on file  Other Topics Concern  . Not on file  Social History Narrative  . Not on file   Social Determinants of Health   Financial Resource Strain:   . Difficulty of Paying Living Expenses:   Food Insecurity:   . Worried About Programme researcher, broadcasting/film/video in the Last Year:   . Barista in the Last Year:   Transportation Needs:   . Freight forwarder (Medical):   Marland Kitchen Lack of Transportation (Non-Medical):   Physical Activity:   . Days of Exercise per Week:   . Minutes of Exercise per Session:   Stress:   . Feeling of Stress :   Social Connections:   . Frequency of Communication with Friends and Family:   . Frequency of Social Gatherings with Friends and Family:   . Attends Religious Services:   . Active Member of Clubs or Organizations:   . Attends Banker Meetings:   Marland Kitchen Marital Status:   Intimate Partner Violence:   . Fear of Current or Ex-Partner:   . Emotionally Abused:   Marland Kitchen Physically Abused:   . Sexually Abused:     Past Medical History,  Surgical history, Social history, and Family history were reviewed and updated as appropriate.   Please see review of systems for further details on the patient's review from today.   Objective:   Physical Exam:  Wt 257 lb (116.6 kg)   BMI 41.48 kg/m   Physical Exam Neurological:     Mental Status: She is alert and oriented to person, place, and time.     Cranial Nerves: No dysarthria.  Psychiatric:        Attention and Perception: Attention and perception normal.        Mood and Affect: Mood normal.        Speech: Speech normal.        Behavior: Behavior is cooperative.        Thought Content: Thought content normal. Thought content is not paranoid or delusional. Thought content does not include homicidal or suicidal ideation. Thought content does not include homicidal or suicidal plan.        Cognition and Memory: Cognition and memory normal.        Judgment: Judgment normal.     Comments: Insight intact     Lab Review:     Component Value Date/Time   NA 143 02/20/2019 0934   K 4.4 02/20/2019 0934   CL 116 (H) 02/20/2019 0934   CO2 23 02/20/2019 0934   GLUCOSE 102 02/20/2019 0934   BUN 16 02/20/2019 0934   CREATININE 1.57 (H) 02/20/2019 0934   CALCIUM 10.0 02/20/2019 0934   PROT 6.3 02/20/2019 0934   ALBUMIN 4.5 03/11/2015 0925   AST 17 02/20/2019 0934   ALT 21 02/20/2019 0934   ALKPHOS 83 03/11/2015 0925   BILITOT 0.4 02/20/2019 0934   GFRNONAA 55 (L) 03/19/2015 0533   GFRAA >60 03/19/2015 0533       Component Value Date/Time   WBC 15.4 (H) 03/19/2015  0533   RBC 3.96 03/19/2015 0533   HGB 11.5 (L) 03/19/2015 0533   HCT 35.0 (L) 03/19/2015 0533   PLT 233 03/19/2015 0533   MCV 88.4 03/19/2015 0533   MCH 29.0 03/19/2015 0533   MCHC 32.9 03/19/2015 0533   RDW 13.6 03/19/2015 0533    Lithium Lvl  Date Value Ref Range Status  02/20/2019 0.8 0.6 - 1.2 mmol/L Final     No results found for: PHENYTOIN, PHENOBARB, VALPROATE, CBMZ   .res Assessment: Plan:    Will continue Depakote ER 750 mg at bedtime for mood stabilization since mood signs and symptoms have been adequately controlled and patient denies any significant tolerability issues. Discussed continuing lithium 300 mg at bedtime for the next month since lithium has been recently reduced from total daily dose of 900 mg, and abrupt discontinuation of lithium has been associated with increased risk of suicide.  Will consider discontinuation of lithium in 1 month if mood signs and symptoms remain stable. Left message for Cordelia Pen Close, NP with [facility] medical nephrology regarding updates in patient's  treatment plan. Patient to follow-up in 1 month or sooner if clinically indicated. Patient advised to contact office with any questions, adverse effects, or acute worsening in signs and symptoms.  Macklyn was seen today for follow-up.  Diagnoses and all orders for this visit:  Bipolar I disorder (HCC) -     lithium carbonate 300 MG capsule; Take 1 capsule (300 mg total) by mouth at bedtime. -     divalproex (DEPAKOTE ER) 250 MG 24 hr tablet; TAKE 1 TABLET (250 MG TOTAL) BY MOUTH DAILY. TAKE WITH 500 MG TAB TO EQUAL TOTAL DOSE OF 750 MG BY MOUTH AT BEDTIME     Please see After Visit Summary for patient specific instructions.  Future Appointments  Date Time Provider Department Center  09/09/2019  2:00 PM Vivi Barrack, [place] [facility]  09/27/2019 11:00 AM Corie Chiquito, PMHNP [facility] None    No orders of the defined types were placed in this encounter.     -------------------------------

## 2019-09-01 NOTE — Progress Notes (Signed)
Subjective: 59 year old female presents the office today for follow-up evaluation of pain and swelling to both of her feet and legs.  She states the compression sock does help on the left side.  She does describe some burning discomfort.  Denies any recent injury or falls. Denies any systemic complaints such as fevers, chills, nausea, vomiting. No acute changes since last appointment, and no other complaints at this time.   She recently sold her house and she will be moving.  Objective: AAO x3, NAD DP/PT pulses palpable bilaterally, CRT less than 3 seconds There is no area pinpoint tenderness.  Flexor, extensor tendons appear to be intact.  The erythematous rash to both of her legs is resolved.  She still describing sharp pains to both of her feet and more nerve pain.  There is still some mild chronic tenderness on the dorsal aspect of right foot unable to elicit any significant discomfort today. No open lesions or pre-ulcerative lesions.  No pain with calf compression, swelling, warmth, erythema  Assessment: Chronic bilateral lower extremity pain  Plan: -All treatment options discussed with the patient including all alternatives, risks, complications.  -At this time we do numerous testing without any significant information.  I recommended nerve biopsy at this time.  I will get this ordered and I will reappoint her for next couple weeks to have this done.  Continue with compression sock.  Continue physical therapy. -Patient encouraged to call the office with any questions, concerns, change in symptoms.   Vivi Barrack DPM

## 2019-09-02 ENCOUNTER — Telehealth: Payer: Self-pay | Admitting: *Deleted

## 2019-09-02 NOTE — Telephone Encounter (Signed)
-----   Message from Vivi Barrack, DPM sent at 09/01/2019  8:59 AM EDT ----- Do we have nerve biopsy kits here? If not can we order one so I can do this for her on the next appointment? Thanks.

## 2019-09-02 NOTE — Telephone Encounter (Signed)
Nerve biopsy kit placed in Dr. Leigh Aurora dictation office noted as D. Wynonia Lawman.

## 2019-09-04 ENCOUNTER — Telehealth: Payer: Self-pay | Admitting: Psychiatry

## 2019-09-04 NOTE — Telephone Encounter (Signed)
Dr. Close is following up on the recommendation for Dana [name]. She has recommended that lithium be discontinued due to pt's rise in creatinine.

## 2019-09-07 DIAGNOSIS — R12 Heartburn: Secondary | ICD-10-CM | POA: Insufficient documentation

## 2019-09-07 DIAGNOSIS — Z9989 Dependence on other enabling machines and devices: Secondary | ICD-10-CM | POA: Insufficient documentation

## 2019-09-07 DIAGNOSIS — G4733 Obstructive sleep apnea (adult) (pediatric): Secondary | ICD-10-CM | POA: Insufficient documentation

## 2019-09-09 ENCOUNTER — Other Ambulatory Visit: Payer: Self-pay

## 2019-09-09 ENCOUNTER — Ambulatory Visit: Payer: Medicare HMO | Admitting: Podiatry

## 2019-09-09 ENCOUNTER — Encounter: Payer: Self-pay | Admitting: Podiatry

## 2019-09-09 VITALS — Temp 97.9°F

## 2019-09-09 DIAGNOSIS — G629 Polyneuropathy, unspecified: Secondary | ICD-10-CM

## 2019-09-09 DIAGNOSIS — M79673 Pain in unspecified foot: Secondary | ICD-10-CM | POA: Diagnosis not present

## 2019-09-09 DIAGNOSIS — R609 Edema, unspecified: Secondary | ICD-10-CM | POA: Diagnosis not present

## 2019-09-09 DIAGNOSIS — G8929 Other chronic pain: Secondary | ICD-10-CM | POA: Diagnosis not present

## 2019-09-09 NOTE — Patient Instructions (Signed)
Wash with soap and water. Dry thoroughly. Apply a small amount of antibiotic ointment and bandage daily

## 2019-09-10 ENCOUNTER — Telehealth: Payer: Self-pay | Admitting: *Deleted

## 2019-09-10 NOTE — Telephone Encounter (Signed)
Sent the nerve biopsy for Dr Ardelle Anton today and the tracking number is [id number]. Misty Stanley

## 2019-09-16 ENCOUNTER — Telehealth: Payer: Self-pay | Admitting: Psychiatry

## 2019-09-16 NOTE — Telephone Encounter (Signed)
Patient called and  Left a message thatsaid that she is not doing well. She has decreased her lithium and feels that is making her not feel well at all. She is requesting a phone call at [phone]

## 2019-09-17 NOTE — Progress Notes (Signed)
Subjective: 59 year old female presents the office with the nerve biopsies.  She states that she is still experiencing burning discomfort to both of her feet.  We have done other testing which have not revealed anything significant at this time we are discussing her biopsies and she wishes to proceed. Denies any systemic complaints such as fevers, chills, nausea, vomiting. No acute changes since last appointment, and no other complaints at this time.   Objective: AAO x3, NAD DP/PT pulses palpable bilaterally, CRT less than 3 seconds Overall exam is unchanged.  There is no area of pinpoint tenderness.  Describes burning sensation in both feet there is mild swelling bilaterally.  There is no pain with calf compression, erythema or warmth.  Flexor, extensor tendons appear to be intact. No open lesions or pre-ulcerative lesions.   Assessment: Neuropathy; chronic foot pain  Plan: -All treatment options discussed with the patient including all alternatives, risks, complications.  -At this time we discussed the node biopsies as well as alternatives, risks, complications.  She wishes to proceed and consent was signed.  I anesthetized the procedure sites bilaterally with a total of 4 cc of local anesthetic in a regional block fashion away from the biopsy site.  Skin was then prepped with Betadine, alcohol.  At this time approximate 10 cm proximal to lateral malleolus a punch biopsy was performed of 3 mm.  These were placed in appropriate specimen containers.  Skin was cleaned alcohol and a bandage applied.  Tolerated well any complications.  The specimen was then rinsed per lab guidelines by Hadley Pen, CMA and sent to [facility] labs.  -Patient encouraged to call the office with any questions, concerns, change in symptoms.   Vivi Barrack DPM

## 2019-09-17 NOTE — Telephone Encounter (Signed)
Called pt and she reports that she has had multiple changes and stressors, with selling her house and moving to an apartment temporarily.   Reports "feeling like I have little pins sticking me all over my body." She feels that this is due to decrease in Lithium. She reports that she feels "hyper." She started noticing this over the weekend.   Denies any manic s/s or depression. Denies SI.   Case staffed with Dr. Jennelle Human.   Left pt a message that what she is describing are not typical s/s with decreasing Lithium but could be related to decrease in dose. Discussed not making any changes at this time and will re-evaluate at upcoming apt. Patient advised to contact office with any questions, adverse effects, or acute worsening in signs and symptoms.

## 2019-09-21 ENCOUNTER — Other Ambulatory Visit: Payer: Self-pay | Admitting: Psychiatry

## 2019-09-21 DIAGNOSIS — F319 Bipolar disorder, unspecified: Secondary | ICD-10-CM

## 2019-09-26 ENCOUNTER — Ambulatory Visit (INDEPENDENT_AMBULATORY_CARE_PROVIDER_SITE_OTHER): Payer: Medicare HMO | Admitting: Podiatry

## 2019-09-26 ENCOUNTER — Other Ambulatory Visit: Payer: Self-pay

## 2019-09-26 DIAGNOSIS — G629 Polyneuropathy, unspecified: Secondary | ICD-10-CM

## 2019-09-26 DIAGNOSIS — M79673 Pain in unspecified foot: Secondary | ICD-10-CM

## 2019-09-26 DIAGNOSIS — G8929 Other chronic pain: Secondary | ICD-10-CM | POA: Diagnosis not present

## 2019-09-27 ENCOUNTER — Ambulatory Visit: Payer: Medicare HMO | Admitting: Psychiatry

## 2019-09-27 NOTE — Progress Notes (Signed)
Subjective: 59--year-old female presents the office today for follow-up evaluation after undergoing nerve biopsies.  She is been having chronic pain to both extremities that her main concern is she is developing numbness to her feet and legs.  Is also describing some weakness to her left leg.  Objective: AAO x3, NAD DP, PT pulses palpable, CRT less than 3 seconds  Sensation absent with Semmes Weinstein monofilament bilaterally. No significant area of pinpoint tenderness.  There is mild edema but there is no erythema or warmth.  Ankle, subtalar range of motion intact. no open lesions or pre-ulcerative lesions.  No pain with calf compression, swelling, warmth, erythema  Assessment: Neuropathy  Plan: -All treatment options discussed with the patient including all alternatives, risks, complications.  -I reviewed the nerve biopsy results with her.  It appears to be within normal limits however there was degenerative changes seen along the anterior epidermal nerve fibers which could be predictive of future development of neuropathy.  However clinically she is having numbness to both feet as well as weakness of the left leg.  We will refer her to neurology for further evaluation.  She agrees with this plan. -Patient encouraged to call the office with any questions, concerns, change in symptoms.   Vivi Barrack DPM

## 2019-09-30 ENCOUNTER — Telehealth: Payer: Self-pay | Admitting: *Deleted

## 2019-09-30 DIAGNOSIS — M79673 Pain in unspecified foot: Secondary | ICD-10-CM

## 2019-09-30 DIAGNOSIS — G629 Polyneuropathy, unspecified: Secondary | ICD-10-CM

## 2019-09-30 DIAGNOSIS — G8929 Other chronic pain: Secondary | ICD-10-CM

## 2019-09-30 DIAGNOSIS — R609 Edema, unspecified: Secondary | ICD-10-CM

## 2019-09-30 NOTE — Telephone Encounter (Signed)
Referral to [facility] - Dr. Allena Katz.

## 2019-09-30 NOTE — Telephone Encounter (Signed)
-----   Message from Vivi Barrack, DPM sent at 09/27/2019  9:49 AM EDT ----- Can you please refer her to Dr. Allena Katz with neurology for neuropathy? Can you send the bako report as well with the referral? Thanks.

## 2019-10-01 ENCOUNTER — Telehealth: Payer: Self-pay | Admitting: *Deleted

## 2019-10-01 DIAGNOSIS — R609 Edema, unspecified: Secondary | ICD-10-CM

## 2019-10-01 DIAGNOSIS — G8929 Other chronic pain: Secondary | ICD-10-CM

## 2019-10-01 DIAGNOSIS — M79673 Pain in unspecified foot: Secondary | ICD-10-CM

## 2019-10-01 DIAGNOSIS — G629 Polyneuropathy, unspecified: Secondary | ICD-10-CM

## 2019-10-01 NOTE — Telephone Encounter (Signed)
Pt called states [facility] can not get her in to be seen until 12/2019 and she is in a great deal of pain. Faxed required form, clinicals and demographics to [facility].

## 2019-10-02 ENCOUNTER — Ambulatory Visit: Payer: Medicare HMO | Admitting: Psychiatry

## 2019-10-02 ENCOUNTER — Telehealth: Payer: Self-pay | Admitting: Podiatry

## 2019-10-02 ENCOUNTER — Ambulatory Visit: Payer: Medicare HMO | Admitting: Podiatry

## 2019-10-02 ENCOUNTER — Ambulatory Visit (INDEPENDENT_AMBULATORY_CARE_PROVIDER_SITE_OTHER): Payer: Medicare HMO

## 2019-10-02 ENCOUNTER — Other Ambulatory Visit: Payer: Self-pay

## 2019-10-02 ENCOUNTER — Encounter: Payer: Self-pay | Admitting: Podiatry

## 2019-10-02 ENCOUNTER — Other Ambulatory Visit: Payer: Self-pay | Admitting: Podiatry

## 2019-10-02 DIAGNOSIS — S93401A Sprain of unspecified ligament of right ankle, initial encounter: Secondary | ICD-10-CM | POA: Diagnosis not present

## 2019-10-02 DIAGNOSIS — R600 Localized edema: Secondary | ICD-10-CM

## 2019-10-02 DIAGNOSIS — G629 Polyneuropathy, unspecified: Secondary | ICD-10-CM | POA: Diagnosis not present

## 2019-10-02 NOTE — Progress Notes (Signed)
Subjective:   Patient ID: Dana [name], female   DOB: 59 y.o.   MRN: [id number]   HPI Patient presents stating she turned her right ankle last week and is been very sore and swollen she is concerned about fracture and she overall is doing okay with everything else   ROS      Objective:  Physical Exam  Neurovascular status was intact negative Denna Haggard' sign noted with quite a bit of swelling pain in the lateral side of the right ankle with inflammation pain around the ligament complex and along the base of the foot     Assessment:  Significant sprain of the right ankle with significant edematous process     Plan:  H&P x-ray reviewed and at this point applied Unna boot Ace wrap surgical shoe advised on elevation and patient will be seen back as symptoms indicate.  Patient should do okay with this  X-rays indicate that there is no signs of fracture or diastases injury at this time with quite a bit of soft tissue swelling

## 2019-10-02 NOTE — Telephone Encounter (Signed)
Pt called and stated that she was having pain and wanted to be seen we offered to come on in and we will work her in she stated she had other appts and she was only available for certain times I then offered her fris she was not please she stated she had been a patient for years and she was in pain I told pt that we were trying to get her in if she would head this way she said never mind and hung the phone up on me. Marchelle Folks tried to call pt back 3 times she did not answer she left her a message.

## 2019-10-03 ENCOUNTER — Telehealth: Payer: Medicare HMO | Admitting: Psychiatry

## 2019-10-04 ENCOUNTER — Telehealth: Payer: Medicare HMO | Admitting: Psychiatry

## 2019-10-07 ENCOUNTER — Encounter: Payer: Self-pay | Admitting: *Deleted

## 2019-10-08 ENCOUNTER — Ambulatory Visit: Payer: Medicare HMO | Admitting: Diagnostic Neuroimaging

## 2019-10-08 NOTE — Telephone Encounter (Signed)
If she calls back I am happy to see her or we can do a virtual visit.

## 2019-10-09 ENCOUNTER — Encounter: Payer: Self-pay | Admitting: Psychiatry

## 2019-10-09 ENCOUNTER — Ambulatory Visit (INDEPENDENT_AMBULATORY_CARE_PROVIDER_SITE_OTHER): Payer: Medicare HMO | Admitting: Psychiatry

## 2019-10-09 ENCOUNTER — Other Ambulatory Visit: Payer: Self-pay

## 2019-10-09 DIAGNOSIS — F5101 Primary insomnia: Secondary | ICD-10-CM | POA: Diagnosis not present

## 2019-10-09 DIAGNOSIS — F419 Anxiety disorder, unspecified: Secondary | ICD-10-CM | POA: Diagnosis not present

## 2019-10-09 DIAGNOSIS — F319 Bipolar disorder, unspecified: Secondary | ICD-10-CM | POA: Diagnosis not present

## 2019-10-09 MED ORDER — LITHIUM CARBONATE 150 MG PO CAPS: 150.0000 mg | ORAL_CAPSULE | Freq: Every day | ORAL | 0 refills | Status: DC

## 2019-10-09 MED ORDER — DIVALPROEX SODIUM ER 500 MG PO TB24: 500.0000 mg | ORAL_TABLET | Freq: Every day | ORAL | 2 refills | Status: DC

## 2019-10-09 NOTE — Progress Notes (Signed)
Dana [name] [id number] 1960-12-15 59 y.o.  Subjective:   Patient ID:  Dana [name] is a 59 y.o. (DOB [birth year]) female.  Chief Complaint:  Chief Complaint  Patient presents with  . Follow-up    Bipolar D/O, Anxiety    HPI Dana [name] presents to the office today for follow-up of mood and anxiety. She reports "my emotions have been fine." She reports that needle sticking sensations have resolved. Denies depressed mood. Denies significant irritability. Denies any manic s/s. Denies excessive spending. Denies anxiety. She reports that her sleep is disrupted due to frequent urination and awakens 3 times to urinate. Denies difficulty falling or returning to sleep. She reports 14 lb intentional wt. loss. Energy and motivation has been ok. Has been walking more now that she has to walk her dog. Concentration has been ok. Denies SI.   Was able to reconnect with her siblings after brother's death. Closed on her mobile home after selling her house. She reports that her mobile home will be ready in 4 months and is staying in an apartment in the meantime. Will be close to her church and friend.   Taking Xanax prn at night and during the day prn.   Past Psychiatric Medication Trials: Lithium-has taken since age 59.  Was on 600 mg twice daily for most of this duration Carbamazepine-flulike signs and symptoms, body aches, headaches, dizziness, and itching Depakote Latuda-headaches, itching Xanax Hydroxyzine  Review of Systems:  Review of Systems  Cardiovascular:       Reports less edema since covid vaccination  Gastrointestinal: Negative.   Genitourinary: Positive for frequency.  Musculoskeletal: Positive for arthralgias. Negative for gait problem.       Foot pain.   Neurological: Positive for tremors.       Neuropathy in lower extremities.   Psychiatric/Behavioral:       Please refer to HPI   She reports that she has been having severe pain that interferes with her activities  and ability to drive at times.   Medications: I have reviewed the patient's current medications.  Current Outpatient Medications  Medication Sig Dispense Refill  . Cholecalciferol (VITAMIN D) 2000 units tablet Take by mouth.    . cyclobenzaprine (FLEXERIL) 10 MG tablet Take 10 mg by mouth 3 (three) times daily as needed for muscle spasms.    . diclofenac sodium (VOLTAREN) 1 % GEL Apply 2 g topically 4 (four) times daily. 100 g 1  . diclofenac sodium (VOLTAREN) 1 % GEL Apply 2 g topically 4 (four) times daily. Rub into affected area of foot 2 to 4 times daily 100 g 2  . dicyclomine (BENTYL) 20 MG tablet Take 20 mg every 6 (six) hours by mouth.    . divalproex (DEPAKOTE ER) 250 MG 24 hr tablet TAKE 1 TABLET (250 MG TOTAL) BY MOUTH DAILY. TAKE WITH 500 MG TAB TO EQUAL TOTAL DOSE OF 750 MG BY MOUTH AT BEDTIME 30 tablet 1  . divalproex (DEPAKOTE ER) 500 MG 24 hr tablet Take 1 tablet (500 mg total) by mouth at bedtime. Take with a 250 mg tab to equal total dose of 750 mg po QHS 30 tablet 2  . gabapentin (NEURONTIN) 600 MG tablet Take 600 mg by mouth at bedtime.  1  . lithium carbonate 150 MG capsule Take 1 capsule (150 mg total) by mouth at bedtime. Take for one month, then stop. 30 capsule 0  . meclizine (ANTIVERT) 25 MG tablet Take 25 mg 3 (three) times daily  as needed by mouth for dizziness.    . ondansetron (ZOFRAN) 8 MG tablet SMARTSIG:1 Tablet(s) By Mouth Every 8 Hours PRN    . ondansetron (ZOFRAN-ODT) 4 MG disintegrating tablet     . Probiotic Product (ALIGN PO) Take by mouth.    . rizatriptan (MAXALT) 10 MG tablet Take by mouth.    . rosuvastatin (CRESTOR) 5 MG tablet Take 5 mg daily by mouth.    . TOVIAZ 4 MG TB24 tablet     . albuterol (PROVENTIL HFA;VENTOLIN HFA) 108 (90 Base) MCG/ACT inhaler Inhale into the lungs.    . ALPRAZolam (XANAX) 0.5 MG tablet Take 1 tablet (0.5 mg total) by mouth 2 (two) times daily as needed for anxiety. 60 tablet 2  . esomeprazole (NEXIUM) 40 MG capsule Take  40 mg by mouth daily as needed.     . fluconazole (DIFLUCAN) 150 MG tablet     . HYDROcodone-acetaminophen (NORCO/VICODIN) 5-325 MG tablet Take one tablet every 8 hours prn foot pain. (Patient not taking: Reported on 10/09/2019) 10 tablet 0  . sulfamethoxazole-trimethoprim (BACTRIM DS) 800-160 MG tablet     . traMADol (ULTRAM) 50 MG tablet Take 1 tablet (50 mg total) by mouth every 12 (twelve) hours as needed. (Patient not taking: Reported on 10/09/2019) 15 tablet 0  . triamcinolone (KENALOG) 0.025 % ointment Apply 1 application topically daily. 30 g 1  . triamcinolone acetonide (KENALOG-40) 40 MG/ML injection (RADIOLOGY ONLY) Inject into the articular space.     No current facility-administered medications for this visit.    Medication Side Effects: None  Allergies:  Allergies  Allergen Reactions  . Anesthetics, Amide Nausea And Vomiting  . Other   . Oxybutynin Other (See Comments)  . Codeine Nausea And Vomiting and Nausea Only    Past Medical History:  Diagnosis Date  . Arthritis    oa  . Bipolar disorder (HCC)   . Chronic foot pain   . Complication of anesthesia   . GERD (gastroesophageal reflux disease)   . Headache    migraines  . Hyperlipidemia   . Hypertension   . Neuropathy   . Palpitations last 1 month ago  . PONV (postoperative nausea and vomiting)   . Sleep apnea     Family History  Problem Relation Age of Onset  . Mood Disorder Mother   . Colon cancer Mother   . Heart disease Father   . Bipolar disorder Son   . Bipolar disorder Brother     Social History   Socioeconomic History  . Marital status: Widowed    Spouse name: Not on file  . Number of children: Not on file  . Years of education: Not on file  . Highest education level: Not on file  Occupational History  . Not on file  Tobacco Use  . Smoking status: Former Smoker    Packs/day: 1.00    Years: 34.00    Pack years: 34.00    Types: Cigarettes    Quit date: 06/07/2011    Years since quitting:  8.3  . Smokeless tobacco: Never Used  Substance and Sexual Activity  . Alcohol use: No  . Drug use: No  . Sexual activity: Not on file  Other Topics Concern  . Not on file  Social History Narrative  . Not on file   Social Determinants of Health   Financial Resource Strain:   . Difficulty of Paying Living Expenses:   Food Insecurity:   . Worried About Programme researcher, broadcasting/film/video  in the Last Year:   . [place] in the Last Year:   Transportation Needs:   . Film/video editor (Medical):   Marland Kitchen Lack of Transportation (Non-Medical):   Physical Activity:   . Days of Exercise per Week:   . Minutes of Exercise per Session:   Stress:   . Feeling of Stress :   Social Connections:   . Frequency of Communication with Friends and Family:   . Frequency of Social Gatherings with Friends and Family:   . Attends Religious Services:   . Active Member of Clubs or Organizations:   . Attends Archivist Meetings:   Marland Kitchen Marital Status:   Intimate Partner Violence:   . Fear of Current or Ex-Partner:   . Emotionally Abused:   Marland Kitchen Physically Abused:   . Sexually Abused:     Past Medical History, Surgical history, Social history, and Family history were reviewed and updated as appropriate.   Please see review of systems for further details on the patient's review from today.   Objective:   Physical Exam:  There were no vitals taken for this visit.  Physical Exam Constitutional:      General: She is not in acute distress. Musculoskeletal:        General: No deformity.  Neurological:     Mental Status: She is alert and oriented to person, place, and time.     Coordination: Coordination normal.  Psychiatric:        Attention and Perception: Attention and perception normal. She does not perceive auditory or visual hallucinations.        Mood and Affect: Mood normal. Mood is not anxious or depressed. Affect is not labile, blunt, angry or inappropriate.        Speech: Speech normal.         Behavior: Behavior normal.        Thought Content: Thought content normal. Thought content is not paranoid or delusional. Thought content does not include homicidal or suicidal ideation. Thought content does not include homicidal or suicidal plan.        Cognition and Memory: Cognition and memory normal.        Judgment: Judgment normal.     Comments: Insight intact     Lab Review:     Component Value Date/Time   NA 143 02/20/2019 0934   K 4.4 02/20/2019 0934   CL 116 (H) 02/20/2019 0934   CO2 23 02/20/2019 0934   GLUCOSE 102 02/20/2019 0934   BUN 16 02/20/2019 0934   CREATININE 1.57 (H) 02/20/2019 0934   CALCIUM 10.0 02/20/2019 0934   PROT 6.3 02/20/2019 0934   ALBUMIN 4.5 03/11/2015 0925   AST 17 02/20/2019 0934   ALT 21 02/20/2019 0934   ALKPHOS 83 03/11/2015 0925   BILITOT 0.4 02/20/2019 0934   GFRNONAA 55 (L) 03/19/2015 0533   GFRAA >60 03/19/2015 0533       Component Value Date/Time   WBC 15.4 (H) 03/19/2015 0533   RBC 3.96 03/19/2015 0533   HGB 11.5 (L) 03/19/2015 0533   HCT 35.0 (L) 03/19/2015 0533   PLT 233 03/19/2015 0533   MCV 88.4 03/19/2015 0533   MCH 29.0 03/19/2015 0533   MCHC 32.9 03/19/2015 0533   RDW 13.6 03/19/2015 0533    Lithium Lvl  Date Value Ref Range Status  02/20/2019 0.8 0.6 - 1.2 mmol/L Final     No results found for: PHENYTOIN, PHENOBARB, VALPROATE, CBMZ   .res Assessment:  Plan:   Discussed that her mood has remained stable with Depakote while decreasing lithium.  Discussed decreasing lithium to 150 mg at bedtime for 1 month and then discontinuing to prevent increased risk of suicide with abrupt discontinuation of lithium and since patient reported possible discontinuation/withdrawal signs and symptoms with previous dose reductions in lithium. Continue Depakote ER 750 mg at bedtime for mood stabilization. Continue alprazolam as needed for anxiety and insomnia. Patient to follow-up in 4 weeks or sooner if clinically  indicated. Patient advised to contact office with any questions, adverse effects, or acute worsening in signs and symptoms.  Dana [name] was seen today for follow-up.  Diagnoses and all orders for this visit:  Bipolar I disorder (HCC) -     lithium carbonate 150 MG capsule; Take 1 capsule (150 mg total) by mouth at bedtime. Take for one month, then stop. -     divalproex (DEPAKOTE ER) 500 MG 24 hr tablet; Take 1 tablet (500 mg total) by mouth at bedtime. Take with a 250 mg tab to equal total dose of 750 mg po QHS  Anxiety disorder, unspecified type  Primary insomnia     Please see After Visit Summary for patient specific instructions.  Future Appointments  Date Time Provider Department Center  11/06/2019 11:00 AM Corie Chiquito, PMHNP [facility] None  11/12/2019  1:00 PM Penumalli, Glenford Bayley, MD [facility] None  12/16/2019  9:10 AM Drema Dallas, DO [facility] None    No orders of the defined types were placed in this encounter.   -------------------------------

## 2019-10-10 ENCOUNTER — Encounter (HOSPITAL_BASED_OUTPATIENT_CLINIC_OR_DEPARTMENT_OTHER): Payer: Medicare HMO | Admitting: Internal Medicine

## 2019-10-22 ENCOUNTER — Other Ambulatory Visit: Payer: Self-pay | Admitting: Psychiatry

## 2019-10-22 DIAGNOSIS — F319 Bipolar disorder, unspecified: Secondary | ICD-10-CM

## 2019-11-04 ENCOUNTER — Other Ambulatory Visit: Payer: Self-pay | Admitting: Psychiatry

## 2019-11-04 DIAGNOSIS — F319 Bipolar disorder, unspecified: Secondary | ICD-10-CM

## 2019-11-06 ENCOUNTER — Ambulatory Visit (INDEPENDENT_AMBULATORY_CARE_PROVIDER_SITE_OTHER): Payer: Medicare HMO | Admitting: Psychiatry

## 2019-11-06 ENCOUNTER — Encounter: Payer: Self-pay | Admitting: Psychiatry

## 2019-11-06 ENCOUNTER — Other Ambulatory Visit: Payer: Self-pay

## 2019-11-06 DIAGNOSIS — F419 Anxiety disorder, unspecified: Secondary | ICD-10-CM | POA: Diagnosis not present

## 2019-11-06 DIAGNOSIS — F319 Bipolar disorder, unspecified: Secondary | ICD-10-CM

## 2019-11-06 DIAGNOSIS — F5101 Primary insomnia: Secondary | ICD-10-CM | POA: Diagnosis not present

## 2019-11-06 MED ORDER — DIVALPROEX SODIUM ER 500 MG PO TB24: 500.0000 mg | ORAL_TABLET | Freq: Every day | ORAL | 2 refills | Status: DC

## 2019-11-06 MED ORDER — ALPRAZOLAM 0.5 MG PO TABS: 0.5000 mg | ORAL_TABLET | Freq: Two times a day (BID) | ORAL | 2 refills | Status: DC | PRN

## 2019-11-06 MED ORDER — DIVALPROEX SODIUM ER 250 MG PO TB24: ORAL_TABLET | ORAL | 2 refills | Status: DC

## 2019-11-06 NOTE — Progress Notes (Signed)
ODA PLACKE [id number] Jul 20, 1960 59 y.o.  Subjective:   Patient ID:  Dana [name] is a 59 y.o. (DOB [birth year]) female.  Chief Complaint:  Chief Complaint  Patient presents with   Follow-up    h/o Bipolar D/O    HPI Dana [name] presents to the office today for follow-up of Bipolar D/O. She reports that her mood has been stable. Denies elevated mood. Denies depressed mood. Denies anxiety other than with flying. Sleeping well from 11 pm and is awakening only once to urinate. She has been sleeping about 8 hours a night. Energy and motivation have been good. Has been walking her dog. Concentration has been good. Denies excessive spending. Denies any impulsive or risky behavior.   Denies SI.   Was able to see her son in [place] this past weekend and enjoyed her trip. She is enjoying where she is living right now. Was able to see her granddaughter over Face time on Mother's day.   Past Psychiatric Medication Trials: Lithium-has taken since age 72. Was on 600 mg twice daily for most of this duration Carbamazepine-flulike signs and symptoms, body aches, headaches, dizziness, and itching Depakote Latuda-headaches, itching Xanax Hydroxyzine    Review of Systems:  Review of Systems  Constitutional:       Reports that hair loss has improved.   Cardiovascular: Positive for leg swelling.  Genitourinary:       Has started botox injections for urinary frequency and reports some partial improvement in urinary retention.   Musculoskeletal: Negative for gait problem.       Knee pain and weakness  Neurological: Negative for tremors.       Reports that she has had trouble with her balance and this has improved.   Psychiatric/Behavioral:       Please refer to HPI  She reports that her swelling improved after covid vaccine. Has apt to see a neurologist next week. Reports worsening neuropathy and numbness in her feet.   Medications: I have reviewed the patient's current  medications.  Current Outpatient Medications  Medication Sig Dispense Refill   albuterol (PROVENTIL HFA;VENTOLIN HFA) 108 (90 Base) MCG/ACT inhaler Inhale into the lungs.     Cholecalciferol (VITAMIN D) 2000 units tablet Take by mouth.     cyclobenzaprine (FLEXERIL) 10 MG tablet Take 10 mg by mouth 3 (three) times daily as needed for muscle spasms.     diclofenac sodium (VOLTAREN) 1 % GEL Apply 2 g topically 4 (four) times daily. 100 g 1   diclofenac sodium (VOLTAREN) 1 % GEL Apply 2 g topically 4 (four) times daily. Rub into affected area of foot 2 to 4 times daily 100 g 2   dicyclomine (BENTYL) 20 MG tablet Take 20 mg every 6 (six) hours by mouth.     divalproex (DEPAKOTE ER) 250 MG 24 hr tablet TAKE 1 TABLET (250 MG TOTAL) BY MOUTH DAILY. TAKE WITH 500 MG TAB TO EQUAL TOTAL DOSE OF 750 MG BY MOUTH AT BEDTIME 30 tablet 2   divalproex (DEPAKOTE ER) 500 MG 24 hr tablet Take 1 tablet (500 mg total) by mouth at bedtime. Take with a 250 mg tab to equal total dose of 750 mg po QHS 30 tablet 2   esomeprazole (NEXIUM) 40 MG capsule Take 40 mg by mouth daily as needed.      fluconazole (DIFLUCAN) 150 MG tablet      gabapentin (NEURONTIN) 600 MG tablet Take 600 mg by mouth at bedtime.  1  meclizine (ANTIVERT) 25 MG tablet Take 25 mg 3 (three) times daily as needed by mouth for dizziness.     ondansetron (ZOFRAN) 8 MG tablet SMARTSIG:1 Tablet(s) By Mouth Every 8 Hours PRN     ondansetron (ZOFRAN-ODT) 4 MG disintegrating tablet      Probiotic Product (ALIGN PO) Take by mouth.     rizatriptan (MAXALT) 10 MG tablet Take by mouth.     rosuvastatin (CRESTOR) 5 MG tablet Take 5 mg daily by mouth.     sulfamethoxazole-trimethoprim (BACTRIM DS) 800-160 MG tablet      traMADol (ULTRAM) 50 MG tablet Take 1 tablet (50 mg total) by mouth every 12 (twelve) hours as needed. 15 tablet 0   triamcinolone (KENALOG) 0.025 % ointment Apply 1 application topically daily. 30 g 1   triamcinolone  acetonide (KENALOG-40) 40 MG/ML injection (RADIOLOGY ONLY) Inject into the articular space.     ALPRAZolam (XANAX) 0.5 MG tablet Take 1 tablet (0.5 mg total) by mouth 2 (two) times daily as needed for anxiety. 60 tablet 2   HYDROcodone-acetaminophen (NORCO/VICODIN) 5-325 MG tablet Take one tablet every 8 hours prn foot pain. (Patient not taking: Reported on 10/09/2019) 10 tablet 0   No current facility-administered medications for this visit.    Medication Side Effects: None  Allergies:  Allergies  Allergen Reactions   Anesthetics, Amide Nausea And Vomiting   Other    Oxybutynin Other (See Comments)   Codeine Nausea And Vomiting and Nausea Only    Past Medical History:  Diagnosis Date   Arthritis    oa   Bipolar disorder (HCC)    Chronic foot pain    Complication of anesthesia    GERD (gastroesophageal reflux disease)    Headache    migraines   Hyperlipidemia    Hypertension    Neuropathy    Palpitations last 1 month ago   PONV (postoperative nausea and vomiting)    Sleep apnea     Family History  Problem Relation Age of Onset   Mood Disorder Mother    Colon cancer Mother    Heart disease Father    Bipolar disorder Son    Bipolar disorder Brother     Social History   Socioeconomic History   Marital status: Widowed    Spouse name: Not on file   Number of children: Not on file   Years of education: Not on file   Highest education level: Not on file  Occupational History   Not on file  Tobacco Use   Smoking status: Former Smoker    Packs/day: 1.00    Years: 34.00    Pack years: 34.00    Types: Cigarettes    Quit date: 06/07/2011    Years since quitting: 8.4   Smokeless tobacco: Never Used  Substance and Sexual Activity   Alcohol use: No   Drug use: No   Sexual activity: Not on file  Other Topics Concern   Not on file  Social History Narrative   Not on file   Social Determinants of Health   Financial Resource  Strain:    Difficulty of Paying Living Expenses:   Food Insecurity:    Worried About Programme researcher, broadcasting/film/video in the Last Year:    Barista in the Last Year:   Transportation Needs:    Freight forwarder (Medical):    Lack of Transportation (Non-Medical):   Physical Activity:    Days of Exercise per Week:    Minutes  of Exercise per Session:   Stress:    Feeling of Stress :   Social Connections:    Frequency of Communication with Friends and Family:    Frequency of Social Gatherings with Friends and Family:    Attends Religious Services:    Active Member of Clubs or Organizations:    Attends Engineer, structural:    Marital Status:   Intimate Partner Violence:    Fear of Current or Ex-Partner:    Emotionally Abused:    Physically Abused:    Sexually Abused:     Past Medical History, Surgical history, Social history, and Family history were reviewed and updated as appropriate.   Please see review of systems for further details on the patient's review from today.   Objective:   Physical Exam:  There were no vitals taken for this visit.  Physical Exam Constitutional:      General: She is not in acute distress. Musculoskeletal:        General: No deformity.  Neurological:     Mental Status: She is alert and oriented to person, place, and time.     Coordination: Coordination normal.  Psychiatric:        Attention and Perception: Attention and perception normal. She does not perceive auditory or visual hallucinations.        Mood and Affect: Mood normal. Mood is not anxious or depressed. Affect is not labile, blunt, angry or inappropriate.        Speech: Speech normal.        Behavior: Behavior normal.        Thought Content: Thought content normal. Thought content is not paranoid or delusional. Thought content does not include homicidal or suicidal ideation. Thought content does not include homicidal or suicidal plan.        Cognition and  Memory: Cognition and memory normal.        Judgment: Judgment normal.     Comments: Insight intact     Lab Review:     Component Value Date/Time   NA 143 02/20/2019 0934   K 4.4 02/20/2019 0934   CL 116 (H) 02/20/2019 0934   CO2 23 02/20/2019 0934   GLUCOSE 102 02/20/2019 0934   BUN 16 02/20/2019 0934   CREATININE 1.57 (H) 02/20/2019 0934   CALCIUM 10.0 02/20/2019 0934   PROT 6.3 02/20/2019 0934   ALBUMIN 4.5 03/11/2015 0925   AST 17 02/20/2019 0934   ALT 21 02/20/2019 0934   ALKPHOS 83 03/11/2015 0925   BILITOT 0.4 02/20/2019 0934   GFRNONAA 55 (L) 03/19/2015 0533   GFRAA >60 03/19/2015 0533       Component Value Date/Time   WBC 15.4 (H) 03/19/2015 0533   RBC 3.96 03/19/2015 0533   HGB 11.5 (L) 03/19/2015 0533   HCT 35.0 (L) 03/19/2015 0533   PLT 233 03/19/2015 0533   MCV 88.4 03/19/2015 0533   MCH 29.0 03/19/2015 0533   MCHC 32.9 03/19/2015 0533   RDW 13.6 03/19/2015 0533    Lithium Lvl  Date Value Ref Range Status  02/20/2019 0.8 0.6 - 1.2 mmol/L Final     No results found for: PHENYTOIN, PHENOBARB, VALPROATE, CBMZ   .res Assessment: Plan:   Pt seen for 30 minutes and time spent counseling pt and discussing s/s with continued dose reduction in Lithium. Agreed that mood remains stable with Depakote ER 750 mg po QHS. Will therefore continue Depakote ER and stop Lithium due to elevated creatinine levels.  Continue  Xanax prn anxiety.  Pt to f/u in 2 months or sooner if clinically indicated.  Patient advised to contact office with any questions, adverse effects, or acute worsening in signs and symptoms.  Dana [name] was seen today for follow-up.  Diagnoses and all orders for this visit:  Bipolar I disorder (HCC) -     divalproex (DEPAKOTE ER) 250 MG 24 hr tablet; TAKE 1 TABLET (250 MG TOTAL) BY MOUTH DAILY. TAKE WITH 500 MG TAB TO EQUAL TOTAL DOSE OF 750 MG BY MOUTH AT BEDTIME -     divalproex (DEPAKOTE ER) 500 MG 24 hr tablet; Take 1 tablet (500 mg total) by  mouth at bedtime. Take with a 250 mg tab to equal total dose of 750 mg po QHS  Anxiety disorder, unspecified type -     ALPRAZolam (XANAX) 0.5 MG tablet; Take 1 tablet (0.5 mg total) by mouth 2 (two) times daily as needed for anxiety.  Primary insomnia -     ALPRAZolam (XANAX) 0.5 MG tablet; Take 1 tablet (0.5 mg total) by mouth 2 (two) times daily as needed for anxiety.     Please see After Visit Summary for patient specific instructions.  Future Appointments  Date Time Provider Department Center  11/12/2019  1:00 PM Penumalli, Glenford Bayley, MD [facility] None  12/16/2019  9:10 AM Everlena Cooper, Adam R, DO [facility] None    No orders of the defined types were placed in this encounter.   -------------------------------

## 2019-11-12 ENCOUNTER — Other Ambulatory Visit: Payer: Self-pay

## 2019-11-12 ENCOUNTER — Ambulatory Visit: Payer: Medicare HMO | Admitting: Diagnostic Neuroimaging

## 2019-11-12 ENCOUNTER — Encounter: Payer: Self-pay | Admitting: Diagnostic Neuroimaging

## 2019-11-12 VITALS — BP 132/79 | HR 80

## 2019-11-12 DIAGNOSIS — M79672 Pain in left foot: Secondary | ICD-10-CM

## 2019-11-12 DIAGNOSIS — M79671 Pain in right foot: Secondary | ICD-10-CM

## 2019-11-12 NOTE — Progress Notes (Signed)
[facility]  PATIENT: Dana [name] DOB: [birth year]  REFERRING CLINICIAN: Vivi Barrack, DPM HISTORY FROM: patient  REASON FOR VISIT: new consult    HISTORICAL  CHIEF COMPLAINT:  Chief Complaint  Patient presents with  . Neuropathy    rm 7 New Pt     HISTORY OF PRESENT ILLNESS:   59 year old female here for evaluation of foot pain.  Patient has had bilateral foot pain and numbness and tingling for several years.  She was seen by neurology in the past, had EMG nerve conduction study which was unremarkable.  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  Symptoms have worsened recently.  She has followed up with podiatrist.  In November 2020 she had Covid and was in the hospital for several days.  Since that time her pain and numbness have worsened.  Her balance is worse.  She is using a cane now.  Patient also struggling with left greater than right knee osteoarthritis, managed by orthopedic surgery.  She has had some injections and physical therapy.  She also has chronic low back pain, chronic hip pain.   REVIEW OF SYSTEMS: Full 14 system review of systems performed and negative with exception of: As per HPI.  ALLERGIES: Allergies  Allergen Reactions  . Anesthetics, Amide Nausea And Vomiting  . Other   . Oxybutynin Other (See Comments)  . Codeine Nausea And Vomiting and Nausea Only    HOME MEDICATIONS: Outpatient Medications Prior to Visit  Medication Sig Dispense Refill  . albuterol (PROVENTIL HFA;VENTOLIN HFA) 108 (90 Base) MCG/ACT inhaler Inhale into the lungs.    . ALPRAZolam (XANAX) 0.5 MG tablet Take 1 tablet (0.5 mg total) by mouth 2 (two) times daily as needed for anxiety. 60 tablet 2  . Cholecalciferol (VITAMIN D) 2000 units tablet Take by mouth.    . cyclobenzaprine (FLEXERIL) 10 MG tablet Take 10 mg by mouth 3 (three) times daily as needed for muscle spasms.    . diclofenac sodium (VOLTAREN) 1 % GEL Apply 2 g  topically 4 (four) times daily. Rub into affected area of foot 2 to 4 times daily 100 g 2  . divalproex (DEPAKOTE ER) 500 MG 24 hr tablet Take 1 tablet (500 mg total) by mouth at bedtime. Take with a 250 mg tab to equal total dose of 750 mg po QHS 30 tablet 2  . fesoterodine (TOVIAZ) 4 MG TB24 tablet Take 4 mg by mouth daily.    Marland Kitchen gabapentin (NEURONTIN) 600 MG tablet Take 600 mg by mouth at bedtime.  1  . Probiotic Product (ALIGN PO) Take by mouth.    . rizatriptan (MAXALT) 10 MG tablet Take by mouth.    . rosuvastatin (CRESTOR) 5 MG tablet Take 5 mg daily by mouth.    . divalproex (DEPAKOTE ER) 250 MG 24 hr tablet TAKE 1 TABLET (250 MG TOTAL) BY MOUTH DAILY. TAKE WITH 500 MG TAB TO EQUAL TOTAL DOSE OF 750 MG BY MOUTH AT BEDTIME 30 tablet 2  . dicyclomine (BENTYL) 20 MG tablet Take 20 mg every 6 (six) hours by mouth.    . esomeprazole (NEXIUM) 40 MG capsule Take 40 mg by mouth daily as needed.     Marland Kitchen HYDROcodone-acetaminophen (NORCO/VICODIN) 5-325 MG tablet Take one tablet every 8 hours prn foot pain. (Patient not taking: Reported on 10/09/2019) 10 tablet 0  . triamcinolone (KENALOG) 0.025 % ointment Apply 1 application topically daily. (Patient not taking: Reported on 11/12/2019) 30 g 1  . triamcinolone  acetonide (KENALOG-40) 40 MG/ML injection (RADIOLOGY ONLY) Inject into the articular space.    . diclofenac sodium (VOLTAREN) 1 % GEL Apply 2 g topically 4 (four) times daily. 100 g 1  . fluconazole (DIFLUCAN) 150 MG tablet     . meclizine (ANTIVERT) 25 MG tablet Take 25 mg 3 (three) times daily as needed by mouth for dizziness.    . ondansetron (ZOFRAN) 8 MG tablet SMARTSIG:1 Tablet(s) By Mouth Every 8 Hours PRN    . ondansetron (ZOFRAN-ODT) 4 MG disintegrating tablet     . sulfamethoxazole-trimethoprim (BACTRIM DS) 800-160 MG tablet     . traMADol (ULTRAM) 50 MG tablet Take 1 tablet (50 mg total) by mouth every 12 (twelve) hours as needed. 15 tablet 0   No facility-administered medications prior to  visit.    PAST MEDICAL HISTORY: Past Medical History:  Diagnosis Date  . Arthritis    oa  . Bipolar disorder (HCC)   . Chronic foot pain   . Complication of anesthesia   . GERD (gastroesophageal reflux disease)   . Headache    migraines  . Hyperlipidemia   . Hypertension   . Neuropathy   . Palpitations last 1 month ago  . PONV (postoperative nausea and vomiting)   . Sleep apnea     PAST SURGICAL HISTORY: Past Surgical History:  Procedure Laterality Date  . ABDOMINAL HYSTERECTOMY  1998   complete  . CESAREAN SECTION  1991  . CHOLECYSTECTOMY  2005  . FOOT SURGERY    . left hip muscle tear surgery  2008  . shoulder scope Left 2015  . 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 TUBAL LIGATION  1992    FAMILY HISTORY: Family History  Problem Relation Age of Onset  . Mood Disorder Mother   . Colon cancer Mother   . Heart disease Father   . Bipolar disorder Son   . Bipolar disorder Brother     SOCIAL HISTORY: Social History   Socioeconomic History  . Marital status: Widowed    Spouse name: Not on file  . Number of children: 2  . Years of education: Not on file  . Highest education level: High school graduate  Occupational History    Comment: disabled  Tobacco Use  . Smoking status: Former Smoker    Packs/day: 1.00    Years: 34.00    Pack years: 34.00    Types: Cigarettes    Quit date: 06/07/2011    Years since quitting: 8.4  . Smokeless tobacco: Never Used  Substance and Sexual Activity  . Alcohol use: No  . Drug use: No  . Sexual activity: Not on file  Other Topics Concern  . Not on file  Social History Narrative   Lives alone   Social Determinants of Health   Financial Resource Strain:   . Difficulty of Paying Living Expenses:   Food Insecurity:   . Worried About Programme researcher, broadcasting/film/video in the Last  Year:   . Barista in the Last Year:   Transportation Needs:   . Freight forwarder (Medical):   Marland Kitchen Lack of Transportation (Non-Medical):   Physical Activity:   . Days of Exercise per Week:   . Minutes of Exercise per Session:   Stress:   . Feeling of Stress :   Social Connections:   . Frequency  of Communication with Friends and Family:   . Frequency of Social Gatherings with Friends and Family:   . Attends Religious Services:   . Active Member of Clubs or Organizations:   . Attends Archivist Meetings:   Marland Kitchen Marital Status:   Intimate Partner Violence:   . Fear of Current or Ex-Partner:   . Emotionally Abused:   Marland Kitchen Physically Abused:   . Sexually Abused:      PHYSICAL EXAM  GENERAL EXAM/CONSTITUTIONAL: Vitals:  Vitals:   11/12/19 1305  BP: 132/79  Pulse: 80     There is no height or weight on file to calculate BMI. Wt Readings from Last 3 Encounters:  01/26/17 240 lb (108.9 kg)  03/18/15 253 lb (114.8 kg)  03/11/15 253 lb (114.8 kg)     Patient is in no distress; well developed, nourished and groomed; neck is supple  CARDIOVASCULAR:  Examination of carotid arteries is normal; no carotid bruits  Regular rate and rhythm, no murmurs  Examination of peripheral vascular system by observation and palpation is normal  EYES:  Ophthalmoscopic exam of optic discs and posterior segments is normal; no papilledema or hemorrhages  No exam data present  MUSCULOSKELETAL:  Gait, strength, tone, movements noted in Neurologic exam below  NEUROLOGIC: MENTAL STATUS:  No flowsheet data found.  awake, alert, oriented to person, place and time  recent and remote memory intact  normal attention and concentration  language fluent, comprehension intact, naming intact  fund of knowledge appropriate  CRANIAL NERVE:   2nd - no papilledema on fundoscopic exam  2nd, 3rd, 4th, 6th - pupils equal and reactive to light, visual fields full to  confrontation, extraocular muscles intact, no nystagmus  5th - facial sensation symmetric  7th - facial strength symmetric  8th - hearing intact  9th - palate elevates symmetrically, uvula midline  11th - shoulder shrug symmetric  12th - tongue protrusion midline  MOTOR:   normal bulk and tone, full strength in the BUE, BLE; LIMITED IN LEFT HIP FLEXION DUE TO PAIN  SENSORY:   normal and symmetric to light touch, temperature, vibration; EXCEPT DECR IN BILATERAL FEET  COORDINATION:   finger-nose-finger, fine finger movements normal  REFLEXES:   deep tendon reflexes TRACE and symmetric  GAIT/STATION:   narrow based gait; ANTALGIC GAIT; LIMPING ON LEFT LEG WITH CANE     DIAGNOSTIC DATA (LABS, IMAGING, TESTING) - I reviewed patient records, labs, notes, testing and imaging myself where available.  Lab Results  Component Value Date   WBC 15.4 (H) 03/19/2015   HGB 11.5 (L) 03/19/2015   HCT 35.0 (L) 03/19/2015   MCV 88.4 03/19/2015   PLT 233 03/19/2015      Component Value Date/Time   NA 143 02/20/2019 0934   K 4.4 02/20/2019 0934   CL 116 (H) 02/20/2019 0934   CO2 23 02/20/2019 0934   GLUCOSE 102 02/20/2019 0934   BUN 16 02/20/2019 0934   CREATININE 1.57 (H) 02/20/2019 0934   CALCIUM 10.0 02/20/2019 0934   PROT 6.3 02/20/2019 0934   ALBUMIN 4.5 03/11/2015 0925   AST 17 02/20/2019 0934   ALT 21 02/20/2019 0934   ALKPHOS 83 03/11/2015 0925   BILITOT 0.4 02/20/2019 0934   GFRNONAA 55 (L) 03/19/2015 0533   GFRAA >60 03/19/2015 0533   No results found for: CHOL, HDL, LDLCALC, LDLDIRECT, TRIG, CHOLHDL No results found for: HGBA1C No results found for: VITAMINB12 Lab Results  Component Value Date   TSH 2.86 02/20/2019  07/12/19 B12 707  01/22/07 MRI lumbar  1.  Mild disc and facet degeneration in the lower lumbar spine.  Small central disc protrusion at L5-S1 may be a source for right greater than left S-1 radiculitis.    2.  Annular tear at L3-L4 without  significant disc protrusion.  01/26/18 2019 EMG/NCS (Dr. Kate Sable) --> "EMG showed no evidence of neuropathy"   ASSESSMENT AND PLAN  59 y.o. year old female here with:  Dx:  1. Pain in both feet      PLAN:  BILATERAL FOOT PAIN / NUMBNESS / BACK PAIN - check MRI lumbar spine (evaluate for lumbar spinal stenosis; lumbar radiculopathies; failed conservative management) - continue PT evaluation  Orders Placed This Encounter  Procedures  . MR LUMBAR SPINE WO CONTRAST   Return for pending if symptoms worsen or fail to improve, return to PCP.    Suanne Marker, MD 11/12/2019, 1:20 PM Certified in Neurology, Neurophysiology and Neuroimaging  [facility] [address], [address] [place], [place] [address] [phone]

## 2019-11-14 ENCOUNTER — Telehealth: Payer: Self-pay | Admitting: Diagnostic Neuroimaging

## 2019-11-14 NOTE — Telephone Encounter (Signed)
[name]: [id number] 9exp. 11/14/19 to 12/14/19) order sent to GI. They will reach out to the patient to schedule.

## 2019-11-19 ENCOUNTER — Ambulatory Visit
Admission: RE | Admit: 2019-11-19 | Discharge: 2019-11-19 | Disposition: A | Discharge status: Home / Self Care | Payer: Medicare HMO | Source: Ambulatory Visit | Attending: Diagnostic Neuroimaging | Admitting: Diagnostic Neuroimaging

## 2019-11-19 ENCOUNTER — Other Ambulatory Visit: Payer: Self-pay

## 2019-11-19 DIAGNOSIS — M79671 Pain in right foot: Secondary | ICD-10-CM

## 2019-11-19 DIAGNOSIS — M545 Low back pain: Secondary | ICD-10-CM

## 2019-11-19 DIAGNOSIS — M79672 Pain in left foot: Secondary | ICD-10-CM

## 2019-11-21 ENCOUNTER — Telehealth: Payer: Self-pay | Admitting: *Deleted

## 2019-11-21 NOTE — Telephone Encounter (Addendum)
Called patient and informed her MRI lumbar spine showed mild disc bulging at L3-4 and L4-5; no other major nerve impingement. Also bilateral renal cysts (follow up with nephrology). She is aware of renal cysts.  She reported no feeling and pain from arch of feet up to tips of toes. She is concerned about the swelling. I advised she call nephrologist at [facility] to discuss. She has upcoming apts with PCP and nephrology but will call nephrology today. Patient verbalized understanding, appreciation.

## 2019-11-21 NOTE — Telephone Encounter (Signed)
Received call from patient who stated in past two weeks she's had pain, swelling in feet, calves. She's had issues since she had Covid.  She also reported shortness of breath. I advised her that swelling, SOB needs to be evaluated by PCP; she stated that her PCP would only order another test. She insisted that she needs to know MRI results and what Dr Marjory Lies advises she do about the pain, swelling. I advised will send note and call her back later today. Patient ended the call.

## 2019-11-21 NOTE — Telephone Encounter (Signed)
Phone rep checked office voicemail's, at 9:12 this morning pt left a message stating that the swelling in her feet has worsen and she would like to see a Dr today.  Please call pt

## 2019-11-27 DIAGNOSIS — Z86010 Personal history of colonic polyps: Secondary | ICD-10-CM | POA: Insufficient documentation

## 2019-11-27 DIAGNOSIS — E559 Vitamin D deficiency, unspecified: Secondary | ICD-10-CM | POA: Insufficient documentation

## 2019-11-27 DIAGNOSIS — K5902 Outlet dysfunction constipation: Secondary | ICD-10-CM | POA: Insufficient documentation

## 2019-11-27 DIAGNOSIS — G629 Polyneuropathy, unspecified: Secondary | ICD-10-CM | POA: Insufficient documentation

## 2019-11-27 DIAGNOSIS — K219 Gastro-esophageal reflux disease without esophagitis: Secondary | ICD-10-CM | POA: Insufficient documentation

## 2019-11-30 ENCOUNTER — Encounter: Payer: Self-pay | Admitting: Podiatry

## 2019-11-30 ENCOUNTER — Ambulatory Visit: Payer: Medicare HMO | Admitting: Podiatry

## 2019-11-30 ENCOUNTER — Other Ambulatory Visit: Payer: Self-pay

## 2019-11-30 DIAGNOSIS — G8929 Other chronic pain: Secondary | ICD-10-CM

## 2019-11-30 DIAGNOSIS — G629 Polyneuropathy, unspecified: Secondary | ICD-10-CM

## 2019-11-30 DIAGNOSIS — M79673 Pain in unspecified foot: Secondary | ICD-10-CM | POA: Diagnosis not present

## 2019-11-30 DIAGNOSIS — R600 Localized edema: Secondary | ICD-10-CM

## 2019-11-30 MED ORDER — HYDROCODONE-ACETAMINOPHEN 5-325 MG PO TABS: ORAL_TABLET | ORAL | 0 refills | Status: DC
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
## 2019-12-02 ENCOUNTER — Telehealth: Payer: Self-pay | Admitting: *Deleted

## 2019-12-02 DIAGNOSIS — M79673 Pain in unspecified foot: Secondary | ICD-10-CM

## 2019-12-02 DIAGNOSIS — R600 Localized edema: Secondary | ICD-10-CM

## 2019-12-02 DIAGNOSIS — G8929 Other chronic pain: Secondary | ICD-10-CM

## 2019-12-02 DIAGNOSIS — G629 Polyneuropathy, unspecified: Secondary | ICD-10-CM

## 2019-12-02 NOTE — Telephone Encounter (Signed)
Faxed referral, clinicals for 6 months, and demographics to [facility] - Dr. Kate Sable.

## 2019-12-02 NOTE — Telephone Encounter (Signed)
-----   Message from Vivi Barrack, DPM sent at 12/01/2019  9:30 AM EDT ----- Can you please put a referral in for her to see [facility] in [facility] Dr. Henrene Dodge. Thanks.

## 2019-12-02 NOTE — Telephone Encounter (Signed)
-----   Message from Vivi Barrack, DPM sent at 12/01/2019  9:31 AM EDT ----- Can you also please order arterial studies?

## 2019-12-03 NOTE — Telephone Encounter (Signed)
Swelling and leg pain

## 2019-12-03 NOTE — Telephone Encounter (Signed)
Faxed orders to [facility]. 

## 2019-12-12 NOTE — Progress Notes (Signed)
NEUROLOGY CONSULTATION NOTE  Dana [name] MRN: [id number] DOB: 16-Jul-1960  Referring provider: Celesta Gentile, DPM Primary care provider: Luther Redo, NP  Reason for consult:  Neuropathy, chronic pain in feet  HISTORY OF PRESENT ILLNESS: Dana [name] is a 59 year old right-handed female with CKD, HTN and Bipolar disorder who presents for chronic bilateral foot pain.  History supplemented by referring provider's notes.  She has complete numbness and tingling from arch to top of toes of both feet.  When she is in bed, she reports pain in her feet, like her feet are broken.  Pain in the feet has been ongoing for several years but worse after she had COVID in November 2020.  Since then, she has had increased diffuse pain.  Numbness has been ongoing for 6 months.  She feet swells and has been told to limit sodium intake.  She has been worked up by neurology in the past.  NCV-EMG from August 2019 was normal.  Labs at that time were largely unremarkable, including negative ANA, sed rate 2, CRP 0.9, and negative RF.  HLA-B27 antigen was positive.  She was evaluated by rheumatology who felt she had no autoimmune disease.   TSH from September 2020 was 2.86.  Hgb A1c and B12 from June 2021 were 5.2 and 865 respectively.  MRI lumbar spine without contrast on 11/19/2019 personally reviewed showed disc bulging and facet hypertrophy at L3-4 and L4-5 causing mild right foraminal stenosis at these levels.  She also has chronic hip and knee pain related to osteoarthritis and has undergone left total hip arthroplasty and viscosupplementation for her left knee.  She has undergone ankle surgery as well.  Currently taking gabapentin 624m.  Other medications include divalproex, Vicodin.    PAST MEDICAL HISTORY: Past Medical History:  Diagnosis Date  . Arthritis    oa  . Bipolar disorder ([place]   . Chronic foot pain   . Complication of anesthesia   . GERD (gastroesophageal reflux disease)   .  Headache    migraines  . Hyperlipidemia   . Hypertension   . Neuropathy   . Palpitations last 1 month ago  . PONV (postoperative nausea and vomiting)   . Sleep apnea     PAST SURGICAL HISTORY: Past Surgical History:  Procedure Laterality Date  . ABDOMINAL HYSTERECTOMY  1998   complete  . CESAREAN SECTION  1991  . CHOLECYSTECTOMY  2005  . FOOT SURGERY    . left hip muscle tear surgery  2008  . shoulder scope Left 2015  . 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   Procedure: LEFT TOTAL HIP ARTHROPLASTY ANTERIOR APPROACH;  Surgeon: FGaynelle Arabian MD;  Location: [facility];  Service: Orthopedics;  Laterality: Left;  . TUBAL LIGATION  1992    MEDICATIONS: Current Outpatient Medications on File Prior to Visit  Medication Sig Dispense Refill  . albuterol (PROVENTIL HFA;VENTOLIN HFA) 108 (90 Base) MCG/ACT inhaler Inhale into the lungs.    . ALPRAZolam (XANAX) 0.5 MG tablet Take 1 tablet (0.5 mg total) by mouth 2 (two) times daily as needed for anxiety. 60 tablet 2  . Cholecalciferol (VITAMIN D) 2000 units tablet Take by mouth.    . cyclobenzaprine (FLEXERIL) 10 MG tablet Take 10 mg by mouth 3 (three) times daily as needed for muscle spasms.    . diclofenac sodium (VOLTAREN) 1 % GEL Apply 2 g topically 4 (four) times daily. Rub into affected area of foot 2 to 4 times daily 100 g 2  .  dicyclomine (BENTYL) 20 MG tablet Take 20 mg every 6 (six) hours by mouth.    . divalproex (DEPAKOTE ER) 500 MG 24 hr tablet Take 1 tablet (500 mg total) by mouth at bedtime. Take with a 250 mg tab to equal total dose of 750 mg po QHS 30 tablet 2  . esomeprazole (NEXIUM) 40 MG capsule Take 40 mg by mouth daily as needed.     . fesoterodine (TOVIAZ) 4 MG TB24 tablet Take 4 mg by mouth daily.    Marland Kitchen gabapentin (NEURONTIN) 600 MG tablet Take 600 mg by mouth at bedtime.  1  . HYDROcodone-acetaminophen (NORCO/VICODIN) 5-325 MG tablet Take one tablet every 8 hours prn foot pain. 10 tablet 0  . Probiotic Product (ALIGN  PO) Take by mouth.    . rizatriptan (MAXALT) 10 MG tablet Take by mouth.    . rosuvastatin (CRESTOR) 5 MG tablet Take 5 mg daily by mouth.    . triamcinolone (KENALOG) 0.025 % ointment Apply 1 application topically daily. (Patient not taking: Reported on 11/12/2019) 30 g 1  . triamcinolone acetonide (KENALOG-40) 40 MG/ML injection (RADIOLOGY ONLY) Inject into the articular space.     No current facility-administered medications on file prior to visit.    ALLERGIES: Allergies  Allergen Reactions  . Anesthetics, Amide Nausea And Vomiting  . Other   . Oxybutynin Other (See Comments)  . Codeine Nausea And Vomiting and Nausea Only    FAMILY HISTORY: Family History  Problem Relation Age of Onset  . Mood Disorder Mother   . Colon cancer Mother   . Heart disease Father   . Bipolar disorder Son   . Bipolar disorder Brother     SOCIAL HISTORY: Social History   Socioeconomic History  . Marital status: Widowed    Spouse name: Not on file  . Number of children: 2  . Years of education: Not on file  . Highest education level: High school graduate  Occupational History    Comment: disabled  Tobacco Use  . Smoking status: Former Smoker    Packs/day: 1.00    Years: 34.00    Pack years: 34.00    Types: Cigarettes    Quit date: 06/07/2011    Years since quitting: 8.5  . Smokeless tobacco: Never Used  Substance and Sexual Activity  . Alcohol use: No  . Drug use: No  . Sexual activity: Not on file  Other Topics Concern  . Not on file  Social History Narrative   Lives alone   Social Determinants of Health   Financial Resource Strain:   . Difficulty of Paying Living Expenses:   Food Insecurity:   . Worried About Charity fundraiser in the Last Year:   . Arboriculturist in the Last Year:   Transportation Needs:   . Film/video editor (Medical):   Marland Kitchen Lack of Transportation (Non-Medical):   Physical Activity:   . Days of Exercise per Week:   . Minutes of Exercise per  Session:   Stress:   . Feeling of Stress :   Social Connections:   . Frequency of Communication with Friends and Family:   . Frequency of Social Gatherings with Friends and Family:   . Attends Religious Services:   . Active Member of Clubs or Organizations:   . Attends Archivist Meetings:   Marland Kitchen Marital Status:   Intimate Partner Violence:   . Fear of Current or Ex-Partner:   . Emotionally Abused:   .  Physically Abused:   . Sexually Abused:     PHYSICAL EXAM: Blood pressure 127/78, pulse 73, height _0  (1.676 m), weight 260 lb 3.2 oz (118 kg), SpO2 99 %. General: No acute distress.  Patient appears well-groomed.   Head:  Normocephalic/atraumatic Eyes:  fundi examined but not visualized Neck: supple, no paraspinal tenderness, full range of motion Back: No paraspinal tenderness Heart: regular rate and rhythm Lungs: Clear to auscultation bilaterally. Vascular: No carotid bruits. Neurological Exam: Mental status: alert and oriented to person, place, and time, recent and remote memory intact, fund of knowledge intact, attention and concentration intact, speech fluent and not dysarthric, language intact. Cranial nerves: CN I: not tested CN II: pupils equal, round and reactive to light, visual fields intact CN III, IV, VI:  full range of motion, no nystagmus, no ptosis CN V: facial sensation intact CN VII: upper and lower face symmetric CN VIII: hearing intact CN IX, X: gag intact, uvula midline CN XI: sternocleidomastoid and trapezius muscles intact CN XII: tongue midline Bulk & Tone: normal, no fasciculations. Motor:  5/5 throughout  Sensation:  Pinprick sensation reduced in on bottom of left foot and toes and dorsum of both feet; vibratory sensation reduced in toes. Deep Tendon Reflexes:  2+ throughout, toes downgoing.   Finger to nose testing:  Without dysmetria.   Heel to shin:  Without dysmetria.   Gait:  Antalgic gait.  Romberg with mild  sway.  IMPRESSION: Bilateral chronic foot pain and numbness, consider peripheral neurology.  PLAN: 1.  Will check CMP for update on kidney function to help guide medication management. 2.  Check SSA/SSB antibodies 3.  Check NCV-EMG of lower extremities 4.  Follow up in 4 to 6 months.  Thank you for allowing me to take part in the care of this patient.  Metta Clines, DO  CC:  Celesta Gentile, DPM  Luther Redo, NP

## 2019-12-16 ENCOUNTER — Other Ambulatory Visit (INDEPENDENT_AMBULATORY_CARE_PROVIDER_SITE_OTHER): Payer: Medicare HMO

## 2019-12-16 ENCOUNTER — Other Ambulatory Visit: Payer: Self-pay

## 2019-12-16 ENCOUNTER — Encounter: Payer: Self-pay | Admitting: Neurology

## 2019-12-16 ENCOUNTER — Ambulatory Visit (INDEPENDENT_AMBULATORY_CARE_PROVIDER_SITE_OTHER): Payer: Medicare HMO | Admitting: Neurology

## 2019-12-16 VITALS — BP 127/78 | HR 73 | Ht 66.0 in | Wt 260.2 lb

## 2019-12-16 DIAGNOSIS — G629 Polyneuropathy, unspecified: Secondary | ICD-10-CM

## 2019-12-16 LAB — COMPREHENSIVE METABOLIC PANEL
ALT: 18 U/L (ref 0–35)
AST: 19 U/L (ref 0–37)
Albumin: 4.5 g/dL (ref 3.5–5.2)
Alkaline Phosphatase: 81 U/L (ref 39–117)
BUN: 17 mg/dL (ref 6–23)
CO2: 26 mEq/L (ref 19–32)
Calcium: 9.8 mg/dL (ref 8.4–10.5)
Chloride: 111 mEq/L (ref 96–112)
Creatinine, Ser: 1.6 mg/dL — ABNORMAL HIGH (ref 0.40–1.20)
GFR: 33.2 mL/min — ABNORMAL LOW (ref >60.00)
Glucose, Bld: 94 mg/dL (ref 70–99)
Potassium: 4.2 mEq/L (ref 3.5–5.1)
Sodium: 145 mEq/L (ref 135–145)
Total Bilirubin: 0.4 mg/dL (ref 0.2–1.2)
Total Protein: 6.8 g/dL (ref 6.0–8.3)

## 2019-12-16 NOTE — Patient Instructions (Addendum)
1.  Will check CMP and SSA/SSB antibodies. Your provider has requested that you have labwork completed today. Please go to [facility] ([id number]) on the second floor of this building before leaving the office today. You do not need to check in. If you are not called within 15 minutes please check with the front desk.   Based on kidney function, I will contact you about medication management for pain. 2.  We will check nerve conduction study of lower extremities 3.  Follow up in 4 to 6 months but we can manage medications as needed in the interim.

## 2019-12-17 ENCOUNTER — Telehealth: Payer: Self-pay

## 2019-12-17 LAB — SJOGREN'S SYNDROME ANTIBODS(SSA + SSB)
SSA (Ro) (ENA) Antibody, IgG: <1 AI
SSB (La) (ENA) Antibody, IgG: <1 AI

## 2019-12-17 NOTE — Telephone Encounter (Signed)
Spoke with pt and reinforced notes from Dr Everlena Cooper. She states she will wait to hear from him since she saw results of labs and knows that her kidney tests still reflect kidney disease. Informed her of EMG appointment. She verbalized understanding and will wait for further recommendation on medication management for pain.  Notes from OV 12/16/2019 - Based on kidney function, I will contact you about medication management for pain. 2.  We will check nerve conduction study of lower extremities 3.  Follow up in 4 to 6 months but we can manage medications as needed in the interim.

## 2019-12-17 NOTE — Telephone Encounter (Signed)
I have left a message with her nephrologist, Dr. Patton Salles, to discuss medication options.  Waiting to get a call back.

## 2019-12-19 MED ORDER — GABAPENTIN 600 MG PO TABS: 300.0000 mg | ORAL_TABLET | Freq: Three times a day (TID) | ORAL | 0 refills | Status: DC

## 2019-12-19 NOTE — Telephone Encounter (Signed)
I spoke with one of Dr. Jacklynn Barnacle associates (Dr. Patton Salles is out of the country).  We can increase her gabapentin to 300mg  three times daily.  Given her renal function, she should monitor for fatigue or tremor.

## 2019-12-19 NOTE — Telephone Encounter (Signed)
Spoke with pt and inst, per Dr Everlena Cooper, that she can change gabapentin to 300 mg (1/2 tablet) 3 times daily and discontinue the 600 mg at bedtime, she should monitor for fatigue and tremor and contact for side effects, she verbalized understanding.

## 2019-12-19 NOTE — Addendum Note (Signed)
Addended by: Kathie Dike A on: 12/19/2019 01:14 PM   Modules accepted: Orders

## 2019-12-27 ENCOUNTER — Ambulatory Visit (INDEPENDENT_AMBULATORY_CARE_PROVIDER_SITE_OTHER): Payer: Medicare HMO | Admitting: Podiatry

## 2019-12-27 ENCOUNTER — Other Ambulatory Visit: Payer: Self-pay | Admitting: Podiatry

## 2019-12-27 ENCOUNTER — Encounter: Payer: Self-pay | Admitting: Podiatry

## 2019-12-27 ENCOUNTER — Other Ambulatory Visit: Payer: Self-pay

## 2019-12-27 VITALS — Temp 98.7°F

## 2019-12-27 DIAGNOSIS — G629 Polyneuropathy, unspecified: Secondary | ICD-10-CM

## 2019-12-27 DIAGNOSIS — G8929 Other chronic pain: Secondary | ICD-10-CM | POA: Diagnosis not present

## 2019-12-27 DIAGNOSIS — M79673 Pain in unspecified foot: Secondary | ICD-10-CM | POA: Diagnosis not present

## 2019-12-27 DIAGNOSIS — I700 Atherosclerosis of aorta: Secondary | ICD-10-CM

## 2019-12-27 MED ORDER — CYCLOBENZAPRINE HCL 10 MG PO TABS: 10.0000 mg | ORAL_TABLET | Freq: Two times a day (BID) | ORAL | 0 refills | Status: DC | PRN

## 2019-12-29 NOTE — Progress Notes (Signed)
Subjective: 59 year old female presents the office today for follow-up evaluation of bilateral lower extremity pain, neuropathy.  Since I last saw her she states that she is doing substantially better and she seems to be much better spirits today.  They have increased her gabapentin dose which has been helping she is also watching her sodium intake.  Swelling to the legs is much improved and the pain she is also experiencing is less.  She states that she did have some pain one night which she felt were most cramping in her legs and she took a Flexeril which resolved this.  She is asking for refill Flexeril to take as needed. Denies any systemic complaints such as fevers, chills, nausea, vomiting. No acute changes since last appointment, and no other complaints at this time.   Objective: AAO x3, NAD DP/PT pulses palpable bilaterally, CRT less than 3 seconds Overall she is doing much better.  Is decreased on the bilateral lower extremities there is no open lesions or erythema.  There is no area of pinpoint tenderness or discomfort to bilateral lower extremities.  Majority discomfort that she is experiencing is nerve related, burning and numbness. No pain with calf compression, swelling, warmth, erythema  Assessment: Neuropathy, lower extremity swelling  Plan: -All treatment options discussed with the patient including all alternatives, risks, complications.  -She is doing much better today to better spirits.  Will continue current dose of gabapentin and worsening intake, elevation.  Encourage walking exercise.  Prescribed Flexeril to take as needed. -She has arterial studies or tests in the next week. -Patient encouraged to call the office with any questions, concerns, change in symptoms.   Vivi Barrack DPM

## 2019-12-30 ENCOUNTER — Ambulatory Visit (HOSPITAL_COMMUNITY)
Admission: RE | Admit: 2019-12-30 | Discharge: 2019-12-30 | Disposition: A | Discharge status: Home / Self Care | Payer: Medicare HMO | Source: Ambulatory Visit | Attending: Cardiology | Admitting: Cardiology

## 2019-12-30 ENCOUNTER — Other Ambulatory Visit: Payer: Self-pay

## 2019-12-30 DIAGNOSIS — G8929 Other chronic pain: Secondary | ICD-10-CM | POA: Diagnosis present

## 2019-12-30 DIAGNOSIS — I700 Atherosclerosis of aorta: Secondary | ICD-10-CM | POA: Diagnosis present

## 2019-12-30 DIAGNOSIS — M79673 Pain in unspecified foot: Secondary | ICD-10-CM | POA: Insufficient documentation

## 2020-01-01 ENCOUNTER — Other Ambulatory Visit: Payer: Self-pay

## 2020-01-01 ENCOUNTER — Ambulatory Visit: Payer: Medicare HMO | Admitting: Neurology

## 2020-01-01 DIAGNOSIS — G629 Polyneuropathy, unspecified: Secondary | ICD-10-CM

## 2020-01-01 NOTE — Procedures (Signed)
[facility]  [address] [place], [address]  [place], [place] [address] Tel: [phone] Fax:  [phone] Test Date:  01/01/2020  Patient: Dana [name] DOB: 1960/06/21 Physician: Nita Sickle, DO  Sex: Female Height: 5\' 6"  Ref Phys: , D.O.  ID#: Shon Millet Temp: 38.0C Technician:    Patient Complaints: This is a 59 year old female referred for evaluation of bilateral leg pain and feet paresthesias.  NCV & EMG Findings: Electrodiagnostic testing of the right lower extremity and additional studies of the left shows: 1. Bilateral sural and superficial peroneal sensory responses are within normal limits. 2. Bilateral peroneal and tibial motor responses are within normal limits. 3. Bilateral tibial H reflex studies are within normal limits. 4. There is no evidence of active or chronic motor axonal changes affecting any of the tested muscles.  Motor unit configuration and recruitment pattern is within normal limits.  Impression: This is a normal study of the lower extremities.  In particular, there is no evidence of a sensorimotor polyneuropathy or lumbosacral radiculopathy.   ___________________________ 60, DO    Nerve Conduction Studies Anti Sensory Summary Table   Stim Site NR Peak (ms) Norm Peak (ms) P-T Amp (V) Norm P-T Amp  Left Sup Peroneal Anti Sensory (Ant Lat Mall)  38C  12 cm    2.5 <4.6 6.6 >4  Right Sup Peroneal Anti Sensory (Ant Lat Mall)  38C  12 cm    2.6 <4.6 4.8 >4  Left Sural Anti Sensory (Lat Mall)  38C  Calf    2.0 <4.6 4.5 >4  Right Sural Anti Sensory (Lat Mall)  38C  Calf    2.9 <4.6 6.4 >4   Motor Summary Table   Stim Site NR Onset (ms) Norm Onset (ms) O-P Amp (mV) Norm O-P Amp Site1 Site2 Delta-0 (ms) Dist (cm) Vel (m/s) Norm Vel (m/s)  Left Peroneal Motor (Ext Dig Brev)  38C  Ankle    3.2 <6.0 3.4 >2.5 B Fib Ankle 7.9 38.0 48 >40  B Fib    11.1  3.1  Poplt B Fib 1.5 10.0 67 >40  Poplt    12.6  3.0         Right  Peroneal Motor (Ext Dig Brev)  38C  Ankle    3.2 <6.0 3.9 >2.5 B Fib Ankle 8.2 37.0 45 >40  B Fib    11.4  3.3  Poplt B Fib 1.5 8.0 53 >40  Poplt    12.9  3.3         Left Tibial Motor (Abd Hall Brev)  38C  Ankle    3.4 <6.0 7.9 >4 Knee Ankle 9.1 44.0 48 >40  Knee    12.5  4.1         Right Tibial Motor (Abd Hall Brev)  38C  Ankle    3.4 <6.0 7.5 >4 Knee Ankle 9.4 44.0 47 >40  Knee    12.8  3.5          H Reflex Studies   NR H-Lat (ms) Lat Norm (ms) L-R H-Lat (ms)  Left Tibial (Gastroc)  38C     32.65 <35 2.18  Right Tibial (Gastroc)  38C     34.83 <35 2.18   EMG   Side Muscle Ins Act Fibs Psw Fasc Number Recrt Dur Dur. Amp Amp. Poly Poly. Comment  Right AntTibialis Nml Nml Nml Nml Nml Nml Nml Nml Nml Nml Nml Nml N/A  Right Gastroc Nml Nml Nml Nml Nml Nml  Nml Nml Nml Nml Nml Nml N/A  Right Flex Dig Long Nml Nml Nml Nml Nml Nml Nml Nml Nml Nml Nml Nml N/A  Right RectFemoris Nml Nml Nml Nml Nml Nml Nml Nml Nml Nml Nml Nml N/A  Right GluteusMed Nml Nml Nml Nml Nml Nml Nml Nml Nml Nml Nml Nml N/A  Left AntTibialis Nml Nml Nml Nml Nml Nml Nml Nml Nml Nml Nml Nml N/A  Left Gastroc Nml Nml Nml Nml Nml Nml Nml Nml Nml Nml Nml Nml N/A  Left Flex Dig Long Nml Nml Nml Nml Nml Nml Nml Nml Nml Nml Nml Nml N/A  Left RectFemoris Nml Nml Nml Nml Nml Nml Nml Nml Nml Nml Nml Nml N/A  Left GluteusMed Nml Nml Nml Nml Nml Nml Nml Nml Nml Nml Nml Nml N/A      Waveforms:

## 2020-01-02 ENCOUNTER — Other Ambulatory Visit: Payer: Self-pay

## 2020-01-02 ENCOUNTER — Telehealth: Payer: Self-pay | Admitting: Neurology

## 2020-01-02 DIAGNOSIS — G629 Polyneuropathy, unspecified: Secondary | ICD-10-CM

## 2020-01-02 NOTE — Telephone Encounter (Signed)
Patient called and left a message returning Paula's call

## 2020-01-02 NOTE — Telephone Encounter (Signed)
Yes.  I see it.  Cancel order.

## 2020-01-02 NOTE — Telephone Encounter (Signed)
Spoke with pt and told her that Dr Everlena Cooper does not need her to do another punch biopsy, she verbalized understanding.

## 2020-01-02 NOTE — Telephone Encounter (Signed)
Spoke with pt about ordered punch biopsy for small fiber neuropathy. She states she had a couple of biopsies done by Dr Ardelle Anton at [facility] in April 2021 and wondering if this is the same and if they need repeated. I will ask Dr Everlena Cooper and let her know, she verbalized understanding and states if he wants this done again she is okay with it.

## 2020-01-02 NOTE — Progress Notes (Unsigned)
Order entered for punch biopsy for small fiber neuropathy.  [facility] [phone] fax [phone]

## 2020-01-03 ENCOUNTER — Telehealth: Payer: Self-pay | Admitting: *Deleted

## 2020-01-03 ENCOUNTER — Telehealth: Payer: Self-pay | Admitting: Neurology

## 2020-01-03 NOTE — Telephone Encounter (Signed)
I informed pt of Dr. Wagoner's review of results. 

## 2020-01-03 NOTE — Telephone Encounter (Signed)
I discussed results via phone with Ms. Donnie Aho. Punch skin biopsy did not reveal any evidence of small fiber neuropathy NCV-EMG did not reveal any electrodiagnostic evidence of a peripheral neuropathy.  Therefore, AT THIS TIME, her pain and numbness in the feet for several years cannot be due to polyneuropathy.  I have prescribed her gabapentin which has been helping.  However, given uncertain diagnosis, further management should be continued by her PCP.

## 2020-01-03 NOTE — Telephone Encounter (Signed)
-----   Message from Vivi Barrack, DPM sent at 12/30/2019  1:58 PM EDT ----- Val- please let her know that the circulation test is normal.

## 2020-01-06 ENCOUNTER — Ambulatory Visit: Payer: Medicare HMO | Admitting: Psychiatry

## 2020-01-13 ENCOUNTER — Ambulatory Visit: Payer: Medicare HMO | Admitting: Psychiatry

## 2020-01-16 ENCOUNTER — Telehealth: Payer: Self-pay | Admitting: *Deleted

## 2020-01-16 NOTE — Telephone Encounter (Signed)
Called patient to check to see how they were doing regarding their Neuropathy.  Patient stated, "I feel good; I went to my 3rd Neurologist yesterday (01/15/20). I am now on Gabapentin 900 mg. I am no longer walking with a walker. [facility] [employer] [facility]".  I told her that since she is doing so well, that Dr. Ardelle Anton wanted to see her in 8 weeks for a follow-up instead of coming to [facility] for her appointment tomorrow, January 17, 2020.  Patient stated they understood.  I had Marchelle Folks, front desk, call patient to get them rescheduled in 8 weeks to see Dr. Ardelle Anton.

## 2020-01-17 ENCOUNTER — Ambulatory Visit: Payer: Medicare HMO | Admitting: Podiatry

## 2020-01-23 ENCOUNTER — Ambulatory Visit: Payer: Medicare HMO | Admitting: Psychiatry

## 2020-02-04 ENCOUNTER — Encounter: Payer: Medicare HMO | Admitting: Neurology

## 2020-02-04 ENCOUNTER — Ambulatory Visit (INDEPENDENT_AMBULATORY_CARE_PROVIDER_SITE_OTHER): Payer: Medicare HMO | Admitting: Psychiatry

## 2020-02-04 ENCOUNTER — Encounter: Payer: Self-pay | Admitting: Psychiatry

## 2020-02-04 ENCOUNTER — Other Ambulatory Visit: Payer: Self-pay

## 2020-02-04 DIAGNOSIS — F5101 Primary insomnia: Secondary | ICD-10-CM | POA: Diagnosis not present

## 2020-02-04 DIAGNOSIS — F419 Anxiety disorder, unspecified: Secondary | ICD-10-CM | POA: Diagnosis not present

## 2020-02-04 DIAGNOSIS — F319 Bipolar disorder, unspecified: Secondary | ICD-10-CM | POA: Diagnosis not present

## 2020-02-04 MED ORDER — DIVALPROEX SODIUM ER 250 MG PO TB24: ORAL_TABLET | ORAL | 2 refills | Status: DC

## 2020-02-04 MED ORDER — DIVALPROEX SODIUM ER 500 MG PO TB24: 500.0000 mg | ORAL_TABLET | Freq: Every day | ORAL | 2 refills | Status: DC

## 2020-02-04 MED ORDER — ALPRAZOLAM 0.5 MG PO TABS: 0.5000 mg | ORAL_TABLET | Freq: Two times a day (BID) | ORAL | 2 refills | Status: DC | PRN

## 2020-02-04 NOTE — Progress Notes (Signed)
Dana [name] [id number] [birth year] 59 y.o.  Subjective:   Patient ID:  Dana [name] is a 59 y.o. (DOB 1960/12/29) female.  Chief Complaint:  Chief Complaint  Patient presents with  . Follow-up    h/o Bipolar D/O and insomnia    HPI Dana [name] presents to the office today for follow-up of Bipolar D/O and insomnia. She reports that her mood has been stable. She reports occasional sad mood. Denies persistent depression. Denies any manic s/s to include excessive spending or impulsive behavior. She reports that her sleep has significantly improved. She reports sleeping a full 8 hours a night. She reports that she would like to lose weight since she is concerned weight could be exacerbating some medical issues. Reports that she lost 3 lbs in the last week. Sees GI specialist tomorrow. Has been increasing her activity. Energy and motivation have been good. Denies anxiety. Concentration has been adequate. Denies SI.   She wishes her family was closer and she could see them more often.    Past Psychiatric Medication Trials: Lithium-has taken since age 66. Was on 600 mg twice daily for most of this duration Carbamazepine-flulike signs and symptoms, body aches, headaches, dizziness, and itching Depakote Latuda-headaches, itching Xanax Hydroxyzine   Review of Systems:  Review of Systems  Gastrointestinal: Positive for nausea.       Reports reflux.  Musculoskeletal: Positive for back pain. Negative for gait problem.  Neurological: Negative for tremors.  Psychiatric/Behavioral:       Please refer to HPI    Medications: I have reviewed the patient's current medications.  Current Outpatient Medications  Medication Sig Dispense Refill  . Cholecalciferol (VITAMIN D) 2000 units tablet Take by mouth.    . cyclobenzaprine (FLEXERIL) 10 MG tablet Take 1 tablet (10 mg total) by mouth 2 (two) times daily as needed for muscle spasms. 10 tablet 0  . diclofenac sodium (VOLTAREN) 1 % GEL  Apply 2 g topically 4 (four) times daily. Rub into affected area of foot 2 to 4 times daily 100 g 2  . divalproex (DEPAKOTE ER) 500 MG 24 hr tablet Take 1 tablet (500 mg total) by mouth at bedtime. Take with a 250 mg tab to equal total dose of 750 mg po QHS 30 tablet 2  . esomeprazole (NEXIUM) 40 MG capsule Take 40 mg by mouth daily as needed.     . gabapentin (NEURONTIN) 300 MG capsule Take 300 mg by mouth 3 (three) times daily.    Marland Kitchen HYDROcodone-acetaminophen (NORCO/VICODIN) 5-325 MG tablet Take one tablet every 8 hours prn foot pain. 10 tablet 0  . Probiotic Product (ALIGN PO) Take by mouth.    . rosuvastatin (CRESTOR) 5 MG tablet Take 5 mg daily by mouth.    Marland Kitchen albuterol (PROVENTIL HFA;VENTOLIN HFA) 108 (90 Base) MCG/ACT inhaler Inhale into the lungs.    . ALPRAZolam (XANAX) 0.5 MG tablet Take 1 tablet (0.5 mg total) by mouth 2 (two) times daily as needed for anxiety. 60 tablet 2  . divalproex (DEPAKOTE ER) 250 MG 24 hr tablet TAKE 1 TABLET (250 MG TOTAL) BY MOUTH DAILY. TAKE WITH 500 MG TAB TO EQUAL TOTAL DOSE OF 750 MG BY MOUTH AT BEDTIME 30 tablet 2  . gabapentin (NEURONTIN) 600 MG tablet Take 0.5 tablets (300 mg total) by mouth 3 (three) times daily. 135 tablet 0  . triamcinolone acetonide (KENALOG-40) 40 MG/ML injection (RADIOLOGY ONLY) Inject into the articular space.     No current facility-administered medications for  this visit.    Medication Side Effects: Other: Dry mouth  Allergies:  Allergies  Allergen Reactions  . Anesthetics, Amide Nausea And Vomiting  . Other   . Oxybutynin Other (See Comments)  . Codeine Nausea And Vomiting and Nausea Only    Past Medical History:  Diagnosis Date  . Arthritis    oa  . Bipolar disorder (HCC)   . Chronic foot pain   . Complication of anesthesia   . GERD (gastroesophageal reflux disease)   . Headache    migraines  . Hyperlipidemia   . Hypertension   . Neuropathy   . Palpitations last 1 month ago  . PONV (postoperative nausea  and vomiting)   . Sleep apnea     Family History  Problem Relation Age of Onset  . Mood Disorder Mother   . Colon cancer Mother   . Heart disease Father   . Bipolar disorder Son   . Bipolar disorder Brother     Social History   Socioeconomic History  . Marital status: Widowed    Spouse name: Not on file  . Number of children: 2  . Years of education: Not on file  . Highest education level: High school graduate  Occupational History    Comment: disabled  Tobacco Use  . Smoking status: Former Smoker    Packs/day: 1.00    Years: 34.00    Pack years: 34.00    Types: Cigarettes    Quit date: 06/07/2011    Years since quitting: 8.6  . Smokeless tobacco: Never Used  Substance and Sexual Activity  . Alcohol use: No  . Drug use: No  . Sexual activity: Not on file  Other Topics Concern  . Not on file  Social History Narrative   Lives alone   Right handed   Social Determinants of Health   Financial Resource Strain:   . Difficulty of Paying Living Expenses: Not on file  Food Insecurity:   . Worried About Programme researcher, broadcasting/film/video in the Last Year: Not on file  . Ran Out of Food in the Last Year: Not on file  Transportation Needs:   . Lack of Transportation (Medical): Not on file  . Lack of Transportation (Non-Medical): Not on file  Physical Activity:   . Days of Exercise per Week: Not on file  . Minutes of Exercise per Session: Not on file  Stress:   . Feeling of Stress : Not on file  Social Connections:   . Frequency of Communication with Friends and Family: Not on file  . Frequency of Social Gatherings with Friends and Family: Not on file  . Attends Religious Services: Not on file  . Active Member of Clubs or Organizations: Not on file  . Attends Banker Meetings: Not on file  . Marital Status: Not on file  Intimate Partner Violence:   . Fear of Current or Ex-Partner: Not on file  . Emotionally Abused: Not on file  . Physically Abused: Not on file  .  Sexually Abused: Not on file    Past Medical History, Surgical history, Social history, and Family history were reviewed and updated as appropriate.   Please see review of systems for further details on the patient's review from today.   Objective:   Physical Exam:  There were no vitals taken for this visit.  Physical Exam Constitutional:      General: She is not in acute distress. Musculoskeletal:        General:  No deformity.  Neurological:     Mental Status: She is alert and oriented to person, place, and time.     Coordination: Coordination normal.  Psychiatric:        Attention and Perception: Attention and perception normal. She does not perceive auditory or visual hallucinations.        Mood and Affect: Mood normal. Mood is not anxious or depressed. Affect is not labile, blunt, angry or inappropriate.        Speech: Speech normal.        Behavior: Behavior normal.        Thought Content: Thought content normal. Thought content is not paranoid or delusional. Thought content does not include homicidal or suicidal ideation. Thought content does not include homicidal or suicidal plan.        Cognition and Memory: Cognition and memory normal.        Judgment: Judgment normal.     Comments: Insight intact     Lab Review:     Component Value Date/Time   NA 145 12/16/2019 1000   K 4.2 12/16/2019 1000   CL 111 12/16/2019 1000   CO2 26 12/16/2019 1000   GLUCOSE 94 12/16/2019 1000   BUN 17 12/16/2019 1000   CREATININE 1.60 (H) 12/16/2019 1000   CREATININE 1.57 (H) 02/20/2019 0934   CALCIUM 9.8 12/16/2019 1000   PROT 6.8 12/16/2019 1000   ALBUMIN 4.5 12/16/2019 1000   AST 19 12/16/2019 1000   ALT 18 12/16/2019 1000   ALKPHOS 81 12/16/2019 1000   BILITOT 0.4 12/16/2019 1000   GFRNONAA 55 (L) 03/19/2015 0533   GFRAA >60 03/19/2015 0533       Component Value Date/Time   WBC 15.4 (H) 03/19/2015 0533   RBC 3.96 03/19/2015 0533   HGB 11.5 (L) 03/19/2015 0533   HCT 35.0  (L) 03/19/2015 0533   PLT 233 03/19/2015 0533   MCV 88.4 03/19/2015 0533   MCH 29.0 03/19/2015 0533   MCHC 32.9 03/19/2015 0533   RDW 13.6 03/19/2015 0533    Lithium Lvl  Date Value Ref Range Status  02/20/2019 0.8 0.6 - 1.2 mmol/L Final    VPA was 37.0 on 01/23/20.    No results found for: PHENYTOIN, PHENOBARB, VALPROATE, CBMZ   .res Assessment: Plan:   Will continue current plan of care since target signs and symptoms are well controlled without any tolerability issues. Reviewed recent labs to include VPA and CMP. LFT's WNL.  Continue Depakote ER 750 mg po QHS for mood stabilization.  Continue Xanax 0.5 mg po QHS and 0.5 mg as needed for anxiety.  Pt to f/u in 3 months or sooner if clinically indicated. Patient advised to contact office with any questions, adverse effects, or acute worsening in signs and symptoms.  Skylynn was seen today for follow-up.  Diagnoses and all orders for this visit:  Anxiety disorder, unspecified type -     ALPRAZolam (XANAX) 0.5 MG tablet; Take 1 tablet (0.5 mg total) by mouth 2 (two) times daily as needed for anxiety.  Primary insomnia -     ALPRAZolam (XANAX) 0.5 MG tablet; Take 1 tablet (0.5 mg total) by mouth 2 (two) times daily as needed for anxiety.  Bipolar I disorder (HCC) -     divalproex (DEPAKOTE ER) 250 MG 24 hr tablet; TAKE 1 TABLET (250 MG TOTAL) BY MOUTH DAILY. TAKE WITH 500 MG TAB TO EQUAL TOTAL DOSE OF 750 MG BY MOUTH AT BEDTIME -  divalproex (DEPAKOTE ER) 500 MG 24 hr tablet; Take 1 tablet (500 mg total) by mouth at bedtime. Take with a 250 mg tab to equal total dose of 750 mg po QHS     Please see After Visit Summary for patient specific instructions.  Future Appointments  Date Time Provider Department Center  03/13/2020  9:15 AM Vivi BarrackWagoner, Matthew R, DPM [facility] None  05/05/2020  1:00 PM Corie Chiquitoarter, Ahjanae Cassel, PMHNP [facility] None  06/17/2020 10:50 AM Drema DallasJaffe, Adam R, DO [facility] None    No orders of the defined types were placed  in this encounter.   -------------------------------

## 2020-02-05 ENCOUNTER — Other Ambulatory Visit: Payer: Self-pay | Admitting: Gastroenterology

## 2020-02-05 DIAGNOSIS — R1319 Other dysphagia: Secondary | ICD-10-CM

## 2020-02-12 ENCOUNTER — Ambulatory Visit
Admission: RE | Admit: 2020-02-12 | Discharge: 2020-02-12 | Disposition: A | Discharge status: Home / Self Care | Payer: Medicare HMO | Source: Ambulatory Visit | Attending: Gastroenterology | Admitting: Gastroenterology

## 2020-02-12 DIAGNOSIS — R1319 Other dysphagia: Secondary | ICD-10-CM

## 2020-03-13 ENCOUNTER — Ambulatory Visit: Payer: Medicare HMO | Admitting: Podiatry

## 2020-03-13 ENCOUNTER — Other Ambulatory Visit: Payer: Self-pay

## 2020-03-13 DIAGNOSIS — M779 Enthesopathy, unspecified: Secondary | ICD-10-CM | POA: Diagnosis not present

## 2020-03-13 DIAGNOSIS — G8929 Other chronic pain: Secondary | ICD-10-CM | POA: Diagnosis not present

## 2020-03-13 DIAGNOSIS — M79673 Pain in unspecified foot: Secondary | ICD-10-CM

## 2020-03-13 DIAGNOSIS — G629 Polyneuropathy, unspecified: Secondary | ICD-10-CM | POA: Diagnosis not present

## 2020-03-13 NOTE — Progress Notes (Addendum)
Subjective: 59 year old female presents the office today for follow-up evaluation neuropathy, swelling, chronic foot pain.  She states that she has been doing well.  She has had 2 injections in her back and since then the pain to her legs have been much improved as well as the swelling.  Still gets some pain to the office after the right foot plantar wart of the fifth metatarsal base.  No recent injury or falls. Denies any systemic complaints such as fevers, chills, nausea, vomiting. No acute changes since last appointment, and no other complaints at this time.   Objective: AAO x3, NAD-presents today wearing crocs DP/PT pulses palpable bilaterally, CRT less than 3 seconds Test on the fifth metatarsal base on the lateral aspect of right foot and minimal along the course the peroneal tendon.  There is no significant edema, erythema.  There is no other areas of discomfort identified today.  She still describing some numbness to both of her feet but this seems to be improving. No pain with calf compression, swelling, warmth, erythema  Assessment: 59 year old female with peroneal tendinitis, fifth metatarsal base pain right foot; neuropathy  Plan: -All treatment options discussed with the patient including all alternatives, risks, complications.  -Sterile injection performed to the fifth metatarsal base today.  Skin was cleaned with alcohol and mixture of half cc dexamethasone phosphate, half cc licodaine plain was infiltrated submetatarsal base.  Care was taken not directly infiltrated to the peroneal tendon.  Postinjection care discussed. -Now that the swelling has improved discussed wearing a tennis shoe with orthotics and good support.  She is not able to wear regular shoes because of swelling previously. -Ice and elevation as well as Voltaren gel as needed -Patient encouraged to call the office with any questions, concerns, change in symptoms.   Dana [name] DPM

## 2020-04-02 ENCOUNTER — Other Ambulatory Visit: Payer: Self-pay

## 2020-04-02 ENCOUNTER — Telehealth: Payer: Self-pay | Admitting: Psychiatry

## 2020-04-02 DIAGNOSIS — F319 Bipolar disorder, unspecified: Secondary | ICD-10-CM

## 2020-04-02 MED ORDER — DIVALPROEX SODIUM ER 500 MG PO TB24: 500.0000 mg | ORAL_TABLET | Freq: Every day | ORAL | 2 refills | Status: DC

## 2020-04-02 NOTE — Telephone Encounter (Signed)
Rx for 500 mg Depakote sent per request

## 2020-04-02 NOTE — Telephone Encounter (Signed)
Pt called and said that she needs a refill on her depakote er 500 mg to the [facility] on [address] in [place]. She has an appt 11/30

## 2020-04-28 ENCOUNTER — Ambulatory Visit: Payer: Medicare HMO | Admitting: Podiatry

## 2020-05-05 ENCOUNTER — Ambulatory Visit (INDEPENDENT_AMBULATORY_CARE_PROVIDER_SITE_OTHER): Payer: Medicare HMO | Admitting: Psychiatry

## 2020-05-05 ENCOUNTER — Other Ambulatory Visit: Payer: Self-pay

## 2020-05-05 ENCOUNTER — Encounter: Payer: Self-pay | Admitting: Psychiatry

## 2020-05-05 DIAGNOSIS — F419 Anxiety disorder, unspecified: Secondary | ICD-10-CM | POA: Diagnosis not present

## 2020-05-05 DIAGNOSIS — F5101 Primary insomnia: Secondary | ICD-10-CM | POA: Diagnosis not present

## 2020-05-05 DIAGNOSIS — F319 Bipolar disorder, unspecified: Secondary | ICD-10-CM | POA: Diagnosis not present

## 2020-05-05 MED ORDER — DIVALPROEX SODIUM ER 500 MG PO TB24: 1000.0000 mg | ORAL_TABLET | Freq: Every day | ORAL | 0 refills | Status: DC

## 2020-05-05 MED ORDER — ALPRAZOLAM 0.5 MG PO TABS: 0.5000 mg | ORAL_TABLET | Freq: Two times a day (BID) | ORAL | 2 refills | Status: DC | PRN

## 2020-05-05 NOTE — Progress Notes (Signed)
Dana [name] [id number] [birth year] 59 y.o.  Subjective:   Patient ID:  Dana [name] is a 59 y.o. (DOB 1960-11-09) female.  Chief Complaint:  Chief Complaint  Patient presents with  . Other    Mood lability  . Follow-up    Anxiety    HPI Dana [name] presents to the office today for follow-up of Bipolar D/O, Anxiety, and insomnia. Moved 2 weeks ago and is enjoying her new home. She denies any manic s/s. Denies excessive spending. She reports that she stayed with her sister the last couple of weeks before leaving her apartment due to a neighbor becoming verbally agitated with her in a restaurant and the staff calling the police. She reports that she has periods of anxiety in response to being alone. Denies panic attacks. She reports intentional wt loss. She reports that her energy is lower in the afternoon. Has been trying to rest more. Concentration has been adequate. Sleeping well. She reports that her half-sister can say hurtful things to her. She reports that her siblings have been less supportive recently. Has been able to have face time with granddaughter. Has been missing her husband and since the move is now seeing more of his belongings. Denies depressed mood. She reports some irritability and attributes this to unpacking and wanting her house settled and in order. Denies SI.   Best friend lives 10 minutes from her new home. She reports that her preacher's wife has been checking in on her.   Today is the birthday of her deceased brother.  Past Psychiatric Medication Trials: Lithium-has taken since age 31. Was on 600 mg twice daily for most of this duration Carbamazepine-flulike signs and symptoms, body aches, headaches, dizziness, and itching Depakote Latuda-headaches, itching Xanax Hydroxyzine    Review of Systems:  Review of Systems  Musculoskeletal: Positive for back pain and gait problem.       Knee pain  Neurological: Positive for numbness.       Numbness in  feet. She reports "my head hurts."  Psychiatric/Behavioral:       Please refer to HPI    Medications: I have reviewed the patient's current medications.  Current Outpatient Medications  Medication Sig Dispense Refill  . Acetaminophen (TYLENOL) 325 MG CAPS 3 capsules    . Cholecalciferol (VITAMIN D) 2000 units tablet Take by mouth.    . cyclobenzaprine (FLEXERIL) 10 MG tablet Take 1 tablet (10 mg total) by mouth 2 (two) times daily as needed for muscle spasms. 10 tablet 0  . diclofenac sodium (VOLTAREN) 1 % GEL Apply 2 g topically 4 (four) times daily. Rub into affected area of foot 2 to 4 times daily 100 g 2  . divalproex (DEPAKOTE ER) 500 MG 24 hr tablet Take 2 tablets (1,000 mg total) by mouth at bedtime. 180 tablet 0  . esomeprazole (NEXIUM) 40 MG capsule Take 40 mg by mouth daily as needed.     . gabapentin (NEURONTIN) 300 MG capsule Take 300 mg by mouth 3 (three) times daily.    Marland Kitchen HYDROcodone-acetaminophen (NORCO/VICODIN) 5-325 MG tablet Take one tablet every 8 hours prn foot pain. 10 tablet 0  . ondansetron (ZOFRAN) 4 MG tablet     . Probiotic Product (ALIGN PO) Take by mouth.    Marland Kitchen albuterol (PROVENTIL HFA;VENTOLIN HFA) 108 (90 Base) MCG/ACT inhaler Inhale into the lungs.    Melene Muller ON 06/02/2020] ALPRAZolam (XANAX) 0.5 MG tablet Take 1 tablet (0.5 mg total) by mouth 2 (two) times daily  as needed for anxiety. 60 tablet 2  . fesoterodine (TOVIAZ) 8 MG TB24 tablet Take by mouth.    . promethazine (PHENERGAN) 12.5 MG tablet     . rosuvastatin (CRESTOR) 5 MG tablet Take 5 mg daily by mouth.    . sucralfate (CARAFATE) 1 g tablet     . triamcinolone acetonide (KENALOG-40) 40 MG/ML injection (RADIOLOGY ONLY) Inject into the articular space.    . Triamcinolone Acetonide 0.025 % LOTN triamcinolone acetonide     No current facility-administered medications for this visit.    Medication Side Effects: None  Allergies:  Allergies  Allergen Reactions  . Anesthetics, Amide Nausea And  Vomiting  . Other   . Oxybutynin Other (See Comments)  . Codeine Nausea And Vomiting and Nausea Only    Past Medical History:  Diagnosis Date  . Arthritis    oa  . Bipolar disorder (HCC)   . Chronic foot pain   . Complication of anesthesia   . GERD (gastroesophageal reflux disease)   . Headache    migraines  . Hyperlipidemia   . Hypertension   . Neuropathy   . Palpitations last 1 month ago  . PONV (postoperative nausea and vomiting)   . Sleep apnea     Family History  Problem Relation Age of Onset  . Mood Disorder Mother   . Colon cancer Mother   . Heart disease Father   . Bipolar disorder Son   . Bipolar disorder Brother     Social History   Socioeconomic History  . Marital status: Widowed    Spouse name: Not on file  . Number of children: 2  . Years of education: Not on file  . Highest education level: High school graduate  Occupational History    Comment: disabled  Tobacco Use  . Smoking status: Former Smoker    Packs/day: 1.00    Years: 34.00    Pack years: 34.00    Types: Cigarettes    Quit date: 06/07/2011    Years since quitting: 8.9  . Smokeless tobacco: Never Used  Substance and Sexual Activity  . Alcohol use: No  . Drug use: No  . Sexual activity: Not on file  Other Topics Concern  . Not on file  Social History Narrative   Lives alone   Right handed   Social Determinants of Health   Financial Resource Strain:   . Difficulty of Paying Living Expenses: Not on file  Food Insecurity:   . Worried About Programme researcher, broadcasting/film/video in the Last Year: Not on file  . Ran Out of Food in the Last Year: Not on file  Transportation Needs:   . Lack of Transportation (Medical): Not on file  . Lack of Transportation (Non-Medical): Not on file  Physical Activity:   . Days of Exercise per Week: Not on file  . Minutes of Exercise per Session: Not on file  Stress:   . Feeling of Stress : Not on file  Social Connections:   . Frequency of Communication with  Friends and Family: Not on file  . Frequency of Social Gatherings with Friends and Family: Not on file  . Attends Religious Services: Not on file  . Active Member of Clubs or Organizations: Not on file  . Attends Banker Meetings: Not on file  . Marital Status: Not on file  Intimate Partner Violence:   . Fear of Current or Ex-Partner: Not on file  . Emotionally Abused: Not on file  .  Physically Abused: Not on file  . Sexually Abused: Not on file    Past Medical History, Surgical history, Social history, and Family history were reviewed and updated as appropriate.   Please see review of systems for further details on the patient's review from today.   Objective:   Physical Exam:  There were no vitals taken for this visit.  Physical Exam Constitutional:      General: She is not in acute distress. Musculoskeletal:        General: No deformity.  Neurological:     Mental Status: She is alert and oriented to person, place, and time.     Coordination: Coordination normal.  Psychiatric:        Attention and Perception: Attention and perception normal. She does not perceive auditory or visual hallucinations.        Mood and Affect: Affect is labile. Affect is not blunt, angry or inappropriate.        Speech: Speech normal.        Behavior: Behavior normal.        Thought Content: Thought content normal. Thought content is not paranoid or delusional. Thought content does not include homicidal or suicidal ideation. Thought content does not include homicidal or suicidal plan.        Cognition and Memory: Cognition and memory normal.        Judgment: Judgment normal.     Comments: Insight intact Dysphoric mood    Labs reviewed from [facility] in 04/01/20.    Lab Review:     Component Value Date/Time   NA 145 12/16/2019 1000   K 4.2 12/16/2019 1000   CL 111 12/16/2019 1000   CO2 26 12/16/2019 1000   GLUCOSE 94 12/16/2019 1000   BUN 17 12/16/2019 1000   CREATININE 1.60  (H) 12/16/2019 1000   CREATININE 1.57 (H) 02/20/2019 0934   CALCIUM 9.8 12/16/2019 1000   PROT 6.8 12/16/2019 1000   ALBUMIN 4.5 12/16/2019 1000   AST 19 12/16/2019 1000   ALT 18 12/16/2019 1000   ALKPHOS 81 12/16/2019 1000   BILITOT 0.4 12/16/2019 1000   GFRNONAA 55 (L) 03/19/2015 0533   GFRAA >60 03/19/2015 0533       Component Value Date/Time   WBC 15.4 (H) 03/19/2015 0533   RBC 3.96 03/19/2015 0533   HGB 11.5 (L) 03/19/2015 0533   HCT 35.0 (L) 03/19/2015 0533   PLT 233 03/19/2015 0533   MCV 88.4 03/19/2015 0533   MCH 29.0 03/19/2015 0533   MCHC 32.9 03/19/2015 0533   RDW 13.6 03/19/2015 0533    Lithium Lvl  Date Value Ref Range Status  02/20/2019 0.8 0.6 - 1.2 mmol/L Final     No results found for: PHENYTOIN, PHENOBARB, VALPROATE, CBMZ   .res Assessment: Plan:   Discussed potential benefits, risks, and side effects of increasing Depakote Er to 1000 mg po QHS for improved mood stabilization. Pt agrees to increase in dose.  Continue Xanax 0.5 mg po BID prn for anxiety and insomnia.  Pt to follow-up in 2 months or sooner if clinically indicated.  Patient advised to contact office with any questions, adverse effects, or acute worsening in signs and symptoms.  Dana [name] was seen today for other and follow-up.  Diagnoses and all orders for this visit:  Bipolar I disorder (HCC) -     divalproex (DEPAKOTE ER) 500 MG 24 hr tablet; Take 2 tablets (1,000 mg total) by mouth at bedtime.  Anxiety disorder, unspecified type -  ALPRAZolam (XANAX) 0.5 MG tablet; Take 1 tablet (0.5 mg total) by mouth 2 (two) times daily as needed for anxiety.  Primary insomnia -     ALPRAZolam (XANAX) 0.5 MG tablet; Take 1 tablet (0.5 mg total) by mouth 2 (two) times daily as needed for anxiety.     Please see After Visit Summary for patient specific instructions.  Future Appointments  Date Time Provider Department Center  05/08/2020  9:15 AM Vivi BarrackWagoner, Matthew R, DPM [facility] None  06/30/2020   1:00 PM Corie Chiquitoarter, Lamoine Fredricksen, PMHNP [facility] None    No orders of the defined types were placed in this encounter.   -------------------------------

## 2020-05-08 ENCOUNTER — Other Ambulatory Visit: Payer: Self-pay

## 2020-05-08 ENCOUNTER — Ambulatory Visit: Payer: Medicare HMO | Admitting: Podiatry

## 2020-05-08 DIAGNOSIS — M79673 Pain in unspecified foot: Secondary | ICD-10-CM | POA: Diagnosis not present

## 2020-05-08 DIAGNOSIS — G629 Polyneuropathy, unspecified: Secondary | ICD-10-CM

## 2020-05-08 DIAGNOSIS — M722 Plantar fascial fibromatosis: Secondary | ICD-10-CM

## 2020-05-08 DIAGNOSIS — G8929 Other chronic pain: Secondary | ICD-10-CM | POA: Diagnosis not present

## 2020-05-08 DIAGNOSIS — M779 Enthesopathy, unspecified: Secondary | ICD-10-CM | POA: Diagnosis not present

## 2020-05-08 MED ORDER — TRIAMCINOLONE ACETONIDE 10 MG/ML IJ SUSP
10.0000 mg | Freq: Once | INTRAMUSCULAR | Status: AC
  Administered 2020-05-08: 10 mg

## 2020-05-13 NOTE — Progress Notes (Signed)
Subjective: 59 year old female presents the office today for follow evaluation of bilateral chronic foot pain.  Since I last saw her she moved and she is on her feet quite a bit.  She started all pain to the bottom of her left heel at the onset aspect the right ankle on the peroneal tendon.  Neuropathy appointment she has followed up with her back surgeon as well.  She was discussed the other options to help with the numbness, tingling that she is getting to her legs. Denies any systemic complaints such as fevers, chills, nausea, vomiting. No acute changes since last appointment, and no other complaints at this time.   Objective: AAO x3, NAD DP/PT pulses palpable bilaterally, CRT less than 3 seconds There is tenderness palpation on plantar medial tubercle of the calcaneus with insertion of plantar fashion the left side.  Plantar fascial appears to be intact.There is no pain with lateral compression of the the calcaneus.  No edema, erythema the left side.  On the right side there is mild tenderness palpation of the distal portion of the peroneal tendon towards the insertion of the fifth metatarsal base.  MMT 5/5.  No pain with calf compression, swelling, warmth, erythema  Assessment: Left foot plantar fasciitis, right side tendinitis, neuropathy  Plan: -All treatment options discussed with the patient including all alternatives, risks, complications.  -Sterile injection performed left side.  See procedure note below. -Continue stretching, icing daily.  I also will refer to physical therapy for both issues.  Also we discussed alternative treatments for neuropathy.  She is going to also try neurogenix at PT [facility] in [place])  -Added a lateral wedge to her orthotic. -Patient encouraged to call the office with any questions, concerns, change in symptoms.   Procedure: Injection Tendon/Ligament Discussed alternatives, risks, complications and verbal consent was obtained.  Location: LEFT plantar fascia  at the glabrous junction; medial approach. Skin Prep: Alcohol  Injectate: 0.5cc 0.5% marcaine plain, 0.5 cc 2% lidocaine plain and, 1 cc kenalog 10. Disposition: Patient tolerated procedure well. Injection site dressed with a band-aid.  Post-injection care was discussed and return precautions discussed.   Return in about 6 weeks (around 06/19/2020).  Vivi Barrack DPM

## 2020-06-02 NOTE — Addendum Note (Signed)
Addended by: Jacklynn Bue on: 06/02/2020 10:28 AM   Modules accepted: Orders

## 2020-06-17 ENCOUNTER — Ambulatory Visit: Payer: Medicare HMO | Admitting: Neurology

## 2020-06-26 ENCOUNTER — Ambulatory Visit: Payer: Medicare HMO | Admitting: Podiatry

## 2020-06-30 ENCOUNTER — Ambulatory Visit: Payer: Medicare HMO | Admitting: Psychiatry

## 2020-07-08 ENCOUNTER — Other Ambulatory Visit: Payer: Self-pay

## 2020-07-08 DIAGNOSIS — F319 Bipolar disorder, unspecified: Secondary | ICD-10-CM

## 2020-07-08 MED ORDER — DIVALPROEX SODIUM ER 500 MG PO TB24: 1000.0000 mg | ORAL_TABLET | Freq: Every day | ORAL | 0 refills | Status: DC

## 2020-07-09 ENCOUNTER — Telehealth: Payer: Self-pay | Admitting: Psychiatry

## 2020-07-09 DIAGNOSIS — F319 Bipolar disorder, unspecified: Secondary | ICD-10-CM

## 2020-07-09 MED ORDER — DIVALPROEX SODIUM ER 250 MG PO TB24: 750.0000 mg | ORAL_TABLET | Freq: Every day | ORAL | 1 refills | Status: DC

## 2020-07-09 NOTE — Telephone Encounter (Signed)
Rtc to patient and advised her to decrease to Depakote XR 750 mg. She reports just picking up the 500 mg on Sunday and can't afford to pick up another strength. She reports she wants to be taken off of this medication completely, she has had numerous falls and relates it to the Depakote. She also mentioned several doctor apts coming up for her with different issues.   She also thought she picked up a "1000 mg" tablet, discussed with her I didn't know they made that strength but she was certain and not at home. I contacted [facility] to confirm its a 500 mg tablet. Not called patient back again with further instructions.

## 2020-07-09 NOTE — Telephone Encounter (Signed)
Next visit is 08/11/20. Dana [name] called regarding her Divalproex that is causing her unsteadiness while walking. She said she has had this since she first started taking the medicine. What can she do to prevent this from happening? Her number is [phone]. Her pharmacy is [facility] [phone] and phone # is [phone].

## 2020-07-09 NOTE — Addendum Note (Signed)
Addended by: Derenda Mis on: 07/09/2020 12:11 PM   Modules accepted: Orders

## 2020-07-10 NOTE — Telephone Encounter (Signed)
Patient has been taking 500 mg Depakote since Monday due to confusion on her strength. Patient is convinced the Depakote is the reason for all her falls this past year and very anxious about coming off of it. Asking for a sooner apt then 08/11/2020. Informed her I would let the front office check on apts and Shanda Bumps know.

## 2020-07-27 ENCOUNTER — Ambulatory Visit: Payer: Medicare HMO | Attending: Orthopedic Surgery | Admitting: Physical Therapy

## 2020-07-27 ENCOUNTER — Other Ambulatory Visit: Payer: Self-pay

## 2020-07-27 ENCOUNTER — Encounter: Payer: Self-pay | Admitting: Physical Therapy

## 2020-07-27 DIAGNOSIS — M6281 Muscle weakness (generalized): Secondary | ICD-10-CM | POA: Diagnosis present

## 2020-07-27 DIAGNOSIS — R2681 Unsteadiness on feet: Secondary | ICD-10-CM

## 2020-07-27 DIAGNOSIS — M25562 Pain in left knee: Secondary | ICD-10-CM | POA: Insufficient documentation

## 2020-07-27 DIAGNOSIS — M25662 Stiffness of left knee, not elsewhere classified: Secondary | ICD-10-CM | POA: Diagnosis present

## 2020-07-27 DIAGNOSIS — G8929 Other chronic pain: Secondary | ICD-10-CM | POA: Diagnosis present

## 2020-07-27 NOTE — Therapy (Signed)
[facility] [address] [place], [place], [address] Phone: [phone]   Fax:  [phone]  Physical Therapy Evaluation  Patient Details  Name: Dana GaussDenise G [name] MRN: [id number] Date of Birth: 1961-03-24 Referring Provider (PT): Ollen GrossFrank Aluisio MD   Encounter Date: 07/27/2020   PT End of Session - 07/27/20 1228    Visit Number 1    Number of Visits 12    Date for PT Re-Evaluation 09/07/20    Authorization Type FOTO AT LEAST EVERY 5TH VISIT.  PROGRESS NOTE AT 10TH VISIT.  KX MODIFIER AFTER 15 VISITS.    PT Start Time 1125    PT Stop Time 1220    PT Time Calculation (min) 55 min    Activity Tolerance Patient tolerated treatment well    Behavior During Therapy WFL for tasks assessed/performed           Past Medical History:  Diagnosis Date  . Arthritis    oa  . Bipolar disorder (HCC)   . Chronic foot pain   . Complication of anesthesia   . GERD (gastroesophageal reflux disease)   . Headache    migraines  . Hyperlipidemia   . Hypertension   . Neuropathy   . Palpitations last 1 month ago  . PONV (postoperative nausea and vomiting)   . Sleep apnea     Past Surgical History:  Procedure Laterality Date  . ABDOMINAL HYSTERECTOMY  1998   complete  . CESAREAN SECTION  1991  . CHOLECYSTECTOMY  2005  . FOOT SURGERY    . left hip muscle tear surgery  2008  . shoulder scope Left 2015  . 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   Procedure: LEFT TOTAL HIP ARTHROPLASTY ANTERIOR APPROACH;  Surgeon: Ollen GrossFrank Aluisio, MD;  Location: [facility];  Service: Orthopedics;  Laterality: Left;  . TUBAL LIGATION  1992    Vitals:   07/27/20 1206  SpO2: 94%      Subjective Assessment - 07/27/20 1206    Subjective COVID-19 screen performed prior to patient entering clinic.  The patient presents to the clinic today with c/o chronic left knee pain which she states is due to falls.  She had COVID about a year ago and began falling a lot.  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  She was having LBP but an injection was extremely helpful.  Her last fall was on 05/31/20.  She has a walker and cane at home but had neither with her today.  Still recommended she use an assistive device at this time.  Her left knee pain-level is rated at an 8/10 today.  She has had calf pain as well and a Doppler was negative for a DVT.  She may get another test though.  She has a f/u with her PA-C in early March and is going to discuss various thigh including neck and head pain she has been having.    Pertinent History Left THA (2016), shoulder surgery, ankle surgery, arthritis, Bipolar, HTN, neuropathy, foot pain, cholecystectomy.    How long can you walk comfortably? Short community distances.    Patient Stated Goals Walk good again and not fall.    Currently in Pain? Yes    Pain Score 7     Pain Location Knee    Pain Orientation Left    Pain Descriptors / Indicators Aching    Pain Type Chronic pain    Pain Frequency Constant    Aggravating Factors  Rest.  Pain Relieving Factors Sitting for a long period of time and then getting up.              [facility] PT Assessment - 07/27/20 0001      Assessment   Medical Diagnosis Left knee pain.    Referring Provider (PT) Ollen Gross MD    Onset Date/Surgical Date --   ~ a year ago.     Precautions   Precautions Fall    Precaution Comments Monitored 02 sat.  Recommended patient use assisitve device for safety. Supervised gait at all times in clinic.  Left THA.      Restrictions   Weight Bearing Restrictions No      Balance Screen   Has the patient fallen in the past 6 months Yes    How many times? 12+.    Has the patient had a decrease in activity level because of a fear of falling?  Yes    Is the patient reluctant to leave their home because of a fear of falling?  No      Prior Function   Level of Independence Independent      Observation/Other Assessments   Focus on Therapeutic  Outcomes (FOTO)  Complete.      Observation/Other Assessments-Edema    Edema Circumferential      Circumferential Edema   Circumferential - Right Equal at calves and knees.      Posture/Postural Control   Posture/Postural Control Postural limitations    Postural Limitations Rounded Shoulders;Forward head    Posture Comments genu valgum.      ROM / Strength   AROM / PROM / Strength AROM;Strength      AROM   Overall AROM Comments Full left knee extension and flexion to 110 degrees compared to left which is 120 degrees.      Strength   Overall Strength Comments Left hip flexion and abduction 3+/5, right quads 4- to 4/5.      Palpation   Palpation comment C/o tenderness around her left patella and tibial tubercle.  Her medial gastroc is tender.  No heat or redness observed.      Special Tests   Other special tests (-) Romberg test but definitely requires supervision.      Ambulation/Gait   Gait Comments Very slow and cautious gait, almost shuffled in appearance.  Decreased dorsiflexion, step and stride length.                      Objective measurements completed on examination: See above findings.       [facility] Adult PT Treatment/Exercise - 07/27/20 0001      Modalities   Modalities Electrical Stimulation;Moist Heat      Moist Heat Therapy   Number Minutes Moist Heat 20 Minutes    Moist Heat Location --   Left knee.     Programme researcher, broadcasting/film/video Location Left knee.    Electrical Stimulation Action IFC at 80-150 Hz.    Electrical Stimulation Parameters 40% scan x 20 minutes.    Electrical Stimulation Goals Pain                       PT Long Term Goals - 07/27/20 1245      PT LONG TERM GOAL #1   Title Independent with a HEP.    Time 4    Period Weeks    Status New      PT LONG TERM GOAL #  2   Title Active left knee flexion to 120 degrees so the patient can perform functional tasks and do so with pain not > 2-3/10.     Time 4    Period Weeks    Status New      PT LONG TERM GOAL #3   Title Perform ADL's with left knee pain not > 4/10.    Time 4    Period Weeks    Status New                  Plan - 07/27/20 1239    Clinical Impression Statement The patient presents to OPPT with c/o chronic left knee pain due to mutiple falls.  Her last fall was on 05/31/20, nonetheless, it is strongly recommended that she use an assisitve device (she has a cane and walk er at home).  She demonstrated a negative Romberg test though she requires supervision.  Her pain is remarkable for pain around her left patella.  Her left hip and knee is weak.  She is tender over her left medial gastroc.  DVT was r/o via a Doppler.  Her left knee lacks some active when contralaterally compared.  Her gait is slow and cautious and she almost appears to shuffle.  her functional mobility is imapired due to pain and per patient the effects of long COVID.  Her 02 sat was 94% and RHR 67 bpm today.Patient will benefit from skilled physical therapy intervention to address pain and deficits.    Personal Factors and Comorbidities Comorbidity 1;Comorbidity 2    Comorbidities Left THA (2016), shoulder surgery, ankle surgery, arthritis, Bipolar, HTN, neuropathy, foot pain, cholecystectomy.    Examination-Activity Limitations Other;Locomotion Level    Examination-Participation Restrictions Other    Stability/Clinical Decision Making Evolving/Moderate complexity    Clinical Decision Making Moderate    Rehab Potential Good    PT Frequency 3x / week    PT Duration 4 weeks    PT Treatment/Interventions ADLs/Self Care Home Management;Cryotherapy;Electrical Stimulation;Ultrasound;Moist Heat;Iontophoresis 4mg /ml Dexamethasone;Gait training;Stair training;Functional mobility training;Therapeutic activities;Therapeutic exercise;Balance training;Manual techniques;Patient/family education;Passive range of motion    PT Next Visit Plan Nustep, pain-free LE  ther ex, modalites to left knee as needed.  Rockerboard.  Monitored gait at all times.    Consulted and Agree with Plan of Care Patient           Patient will benefit from skilled therapeutic intervention in order to improve the following deficits and impairments:  Pain,Decreased activity tolerance,Decreased strength  Visit Diagnosis: Chronic pain of left knee - Plan: PT plan of care cert/re-cert  Stiffness of left knee, not elsewhere classified - Plan: PT plan of care cert/re-cert  Muscle weakness (generalized) - Plan: PT plan of care cert/re-cert  Unsteadiness on feet - Plan: PT plan of care cert/re-cert     Problem List Patient Active Problem List   Diagnosis Date Noted  . Constipation by outlet obstruction 11/27/2019  . Esophageal reflux 11/27/2019  . History of colon polyps 11/27/2019  . Peripheral neuropathy 11/27/2019  . Vitamin D deficiency 11/27/2019  . Heartburn 09/07/2019  . Obstructive sleep apnea on CPAP 09/07/2019  . Renal cyst 08/20/2019  . Urge incontinence 08/20/2019  . Bipolar disorder, current episode depressed, moderate (HCC) 04/28/2019  . Psychomotor agitation 04/28/2019  . COVID-19 04/19/2019  . Hypernatremia 04/19/2019  . 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 Neck pain 10/07/2018  . Severe obesity (BMI 35.0-39.9) with comorbidity (HCC) 07/05/2018  . Chronic kidney disease (CKD), stage III (moderate) (HCC)  07/05/2018  . Plantar fasciitis 10/22/2015  . Torn tendon 10/22/2015  . OA (osteoarthritis) of hip 03/18/2015  . Hyperlipidemia 08/06/2014  . Migraine with aura 02/28/2014  . Hypertension 09/27/2013  . Follow-up exam after treatment 09/27/2013  . Eye problem 09/13/2013  . Bipolar affective disorder (HCC) 04/05/2012    Jonna Dittrich, [place] MPT 07/27/2020, 12:52 PM  [facility] [address] [place], [place], [address] Phone: [phone]   Fax:  [phone]  Name: Dana [name] MRN: [id number] Date  of Birth: 10/06/1960

## 2020-07-29 ENCOUNTER — Ambulatory Visit: Payer: Medicare HMO | Admitting: Physical Therapy

## 2020-07-30 ENCOUNTER — Ambulatory Visit (INDEPENDENT_AMBULATORY_CARE_PROVIDER_SITE_OTHER): Payer: Medicare HMO | Admitting: Psychiatry

## 2020-07-30 ENCOUNTER — Ambulatory Visit: Payer: Medicare HMO | Admitting: Physical Therapy

## 2020-07-30 ENCOUNTER — Other Ambulatory Visit: Payer: Self-pay

## 2020-07-30 ENCOUNTER — Encounter: Payer: Self-pay | Admitting: Psychiatry

## 2020-07-30 DIAGNOSIS — G8929 Other chronic pain: Secondary | ICD-10-CM

## 2020-07-30 DIAGNOSIS — F319 Bipolar disorder, unspecified: Secondary | ICD-10-CM | POA: Diagnosis not present

## 2020-07-30 DIAGNOSIS — R2681 Unsteadiness on feet: Secondary | ICD-10-CM

## 2020-07-30 DIAGNOSIS — M6281 Muscle weakness (generalized): Secondary | ICD-10-CM

## 2020-07-30 DIAGNOSIS — M25662 Stiffness of left knee, not elsewhere classified: Secondary | ICD-10-CM

## 2020-07-30 DIAGNOSIS — M25562 Pain in left knee: Secondary | ICD-10-CM | POA: Diagnosis not present

## 2020-07-30 MED ORDER — DIVALPROEX SODIUM ER 250 MG PO TB24: ORAL_TABLET | ORAL | 0 refills | Status: DC

## 2020-07-30 MED ORDER — ARIPIPRAZOLE 5 MG PO TABS: 5.0000 mg | ORAL_TABLET | Freq: Every day | ORAL | 1 refills | Status: DC
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
## 2020-07-30 NOTE — Therapy (Signed)
[facility] [address] [place], [place], [address] Phone: [phone]   Fax:  [phone]  Physical Therapy Treatment  Patient Details  Name: Dana [name] MRN: [id number] Date of Birth: 28-Jan-1961 Referring Provider (PT): Ollen Gross MD   Encounter Date: 07/30/2020   PT End of Session - 07/30/20 1333    Visit Number 2    Number of Visits 12    Date for PT Re-Evaluation 09/07/20    Authorization Type FOTO AT LEAST EVERY 5TH VISIT.  PROGRESS NOTE AT 10TH VISIT.  KX MODIFIER AFTER 15 VISITS.    PT Start Time 0100    PT Stop Time 0141    PT Time Calculation (min) 41 min    Activity Tolerance Patient tolerated treatment well    Behavior During Therapy WFL for tasks assessed/performed           Past Medical History:  Diagnosis Date  . Arthritis    oa  . Bipolar disorder (HCC)   . Chronic foot pain   . Complication of anesthesia   . GERD (gastroesophageal reflux disease)   . Headache    migraines  . Hyperlipidemia   . Hypertension   . Neuropathy   . Palpitations last 1 month ago  . PONV (postoperative nausea and vomiting)   . Sleep apnea     Past Surgical History:  Procedure Laterality Date  . ABDOMINAL HYSTERECTOMY  1998   complete  . CESAREAN SECTION  1991  . CHOLECYSTECTOMY  2005  . FOOT SURGERY    . left hip muscle tear surgery  2008  . shoulder scope Left 2015  . 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 TUBAL LIGATION  1992    There were no vitals filed for this visit.   Subjective Assessment - 07/30/20 1317    Subjective COVID-19 screen performed prior to patient entering clinic.  Got Doppler down to left calf.  Will get results tomorrow.    Pertinent History Left THA (2016), shoulder surgery, ankle surgery, arthritis, Bipolar, HTN, neuropathy, foot pain,  cholecystectomy.    How long can you walk comfortably? Short community distances.    Patient Stated Goals Walk good again and not fall.    Currently in Pain? Yes    Pain Score 7     Pain Location Knee    Pain Orientation Left    Pain Descriptors / Indicators Aching    Pain Type Chronic pain                             [facility] Adult PT Treatment/Exercise - 07/30/20 0001      Exercises   Exercises Knee/Hip      Knee/Hip Exercises: Aerobic   Nustep Level 3 x 15 minutes.  02 sat at 98%.      Modalities   Modalities Electrical Stimulation;Moist Heat      Moist Heat Therapy   Number Minutes Moist Heat 20 Minutes    Moist Heat Location --   left knee.     Programme researcher, broadcasting/film/video Location Left knee.    Electrical Stimulation Action IFC at 80-150 Hz    Electrical Stimulation Parameters 40% scan x 20 minutes.    Electrical Stimulation Goals Pain  PT Long Term Goals - 07/27/20 1245      PT LONG TERM GOAL #1   Title Independent with a HEP.    Time 4    Period Weeks    Status New      PT LONG TERM GOAL #2   Title Active left knee flexion to 120 degrees so the patient can perform functional tasks and do so with pain not > 2-3/10.    Time 4    Period Weeks    Status New      PT LONG TERM GOAL #3   Title Perform ADL's with left knee pain not > 4/10.    Time 4    Period Weeks    Status New                 Plan - 07/30/20 1329    Clinical Impression Statement Patient did very well on Nustep today and her 02 sat was at 98%.  Deferred other exercise today as she is awaiting the result of her Doppler test.    Personal Factors and Comorbidities Comorbidity 1;Comorbidity 2    Comorbidities Left THA (2016), shoulder surgery, ankle surgery, arthritis, Bipolar, HTN, neuropathy, foot pain, cholecystectomy.    Examination-Activity Limitations Other;Locomotion Level    Examination-Participation  Restrictions Other    Stability/Clinical Decision Making Evolving/Moderate complexity    Rehab Potential Good    PT Frequency 3x / week    PT Duration 4 weeks    PT Treatment/Interventions ADLs/Self Care Home Management;Cryotherapy;Electrical Stimulation;Ultrasound;Moist Heat;Iontophoresis 4mg /ml Dexamethasone;Gait training;Stair training;Functional mobility training;Therapeutic activities;Therapeutic exercise;Balance training;Manual techniques;Patient/family education;Passive range of motion    PT Next Visit Plan Nustep, pain-free LE ther ex, modalites to left knee as needed.  Rockerboard.  Monitored gait at all times.    Consulted and Agree with Plan of Care Patient           Patient will benefit from skilled therapeutic intervention in order to improve the following deficits and impairments:  Pain,Decreased activity tolerance,Decreased strength  Visit Diagnosis: Chronic pain of left knee  Stiffness of left knee, not elsewhere classified  Muscle weakness (generalized)  Unsteadiness on feet     Problem List Patient Active Problem List   Diagnosis Date Noted  . Constipation by outlet obstruction 11/27/2019  . Esophageal reflux 11/27/2019  . History of colon polyps 11/27/2019  . Peripheral neuropathy 11/27/2019  . Vitamin D deficiency 11/27/2019  . Heartburn 09/07/2019  . Obstructive sleep apnea on CPAP 09/07/2019  . Renal cyst 08/20/2019  . Urge incontinence 08/20/2019  . Bipolar disorder, current episode depressed, moderate (HCC) 04/28/2019  . Psychomotor agitation 04/28/2019  . COVID-19 04/19/2019  . Hypernatremia 04/19/2019  . 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 Neck pain 10/07/2018  . Severe obesity (BMI 35.0-39.9) with comorbidity (HCC) 07/05/2018  . Chronic kidney disease (CKD), stage III (moderate) (HCC) 07/05/2018  . Plantar fasciitis 10/22/2015  . Torn tendon 10/22/2015  . OA (osteoarthritis) of hip 03/18/2015  . Hyperlipidemia 08/06/2014  . Migraine with  aura 02/28/2014  . Hypertension 09/27/2013  . Follow-up exam after treatment 09/27/2013  . Eye problem 09/13/2013  . Bipolar affective disorder (HCC) 04/05/2012    Javone Ybanez, 04/07/2012 MPT 07/30/2020, 1:42 PM  [facility] [address] [place], [place], [place] Phone: [phone]   Fax:  [phone]  Name: Dana [name] MRN: [name] Date of Birth: [birth year]

## 2020-08-04 ENCOUNTER — Encounter: Payer: Self-pay | Admitting: Physical Therapy

## 2020-08-04 ENCOUNTER — Other Ambulatory Visit: Payer: Self-pay

## 2020-08-04 ENCOUNTER — Ambulatory Visit: Payer: Medicare HMO | Attending: Orthopedic Surgery | Admitting: Physical Therapy

## 2020-08-04 DIAGNOSIS — M25562 Pain in left knee: Secondary | ICD-10-CM | POA: Insufficient documentation

## 2020-08-04 DIAGNOSIS — M6281 Muscle weakness (generalized): Secondary | ICD-10-CM | POA: Insufficient documentation

## 2020-08-04 DIAGNOSIS — G8929 Other chronic pain: Secondary | ICD-10-CM | POA: Diagnosis present

## 2020-08-04 DIAGNOSIS — R2681 Unsteadiness on feet: Secondary | ICD-10-CM | POA: Diagnosis present

## 2020-08-04 DIAGNOSIS — M25662 Stiffness of left knee, not elsewhere classified: Secondary | ICD-10-CM | POA: Diagnosis present

## 2020-08-04 NOTE — Therapy (Signed)
[facility] [address] [place], [place], [address] Phone: [phone]   Fax:  [phone]  Physical Therapy Treatment  Patient Details  Name: Dana [name] MRN: [id number] Date of Birth: 08-11-1960 Referring Provider (PT): Ollen Gross MD   Encounter Date: 08/04/2020   PT End of Session - 08/04/20 1155    Visit Number 3    Number of Visits 12    Date for PT Re-Evaluation 09/07/20    Authorization Type FOTO AT LEAST EVERY 5TH VISIT.  PROGRESS NOTE AT 10TH VISIT.  KX MODIFIER AFTER 15 VISITS.    PT Start Time 1117    PT Stop Time 1158    PT Time Calculation (min) 41 min    Activity Tolerance Patient tolerated treatment well    Behavior During Therapy WFL for tasks assessed/performed           Past Medical History:  Diagnosis Date  . Arthritis    oa  . Bipolar disorder (HCC)   . Chronic foot pain   . Complication of anesthesia   . GERD (gastroesophageal reflux disease)   . Headache    migraines  . Hyperlipidemia   . Hypertension   . Neuropathy   . Palpitations last 1 month ago  . PONV (postoperative nausea and vomiting)   . Sleep apnea     Past Surgical History:  Procedure Laterality Date  . ABDOMINAL HYSTERECTOMY  1998   complete  . CESAREAN SECTION  1991  . CHOLECYSTECTOMY  2005  . FOOT SURGERY    . left hip muscle tear surgery  2008  . shoulder scope Left 2015  . 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 TUBAL LIGATION  1992    There were no vitals filed for this visit.   Subjective Assessment - 08/04/20 1150    Subjective COVID-19 screen performed prior to patient entering clinic. Reports more L hip soreness but is to see MD this week.    Pertinent History Left THA (2016), shoulder surgery, ankle surgery, arthritis, Bipolar, HTN, neuropathy, foot pain,  cholecystectomy.    How long can you walk comfortably? Short community distances.    Patient Stated Goals Walk good again and not fall.    Currently in Pain? Yes    Pain Score --   No pain score provided   Pain Location Hip    Pain Orientation Left;Posterior    Pain Descriptors / Indicators Discomfort    Pain Type Chronic pain    Pain Onset 1 to 4 weeks ago    Pain Frequency Constant              [facility] PT Assessment - 08/04/20 0001      Assessment   Medical Diagnosis Left knee pain.    Referring Provider (PT) Ollen Gross MD      Precautions   Precautions Fall    Precaution Comments Monitored 02 sat.  Recommended patient use assisitve device for safety. Supervised gait at all times in clinic.  Left THA.      Restrictions   Weight Bearing Restrictions No                         [facility] Adult PT Treatment/Exercise - 08/04/20 0001      Knee/Hip Exercises: Aerobic   Nustep L5 x11 min  Knee/Hip Exercises: Standing   Forward Lunges Left;2 sets;10 reps    Forward Lunges Limitations off 6" step    Rocker Board 4 minutes      Knee/Hip Exercises: Supine   Short Arc Quad Sets Strengthening;Left;2 sets;10 reps      Modalities   Modalities Primary school teacher Stimulation Location Left knee.    Electrical Stimulation Action IFC    Electrical Stimulation Parameters 80-150 hz x10 min    Electrical Stimulation Goals Pain                       PT Long Term Goals - 07/27/20 1245      PT LONG TERM GOAL #1   Title Independent with a HEP.    Time 4    Period Weeks    Status New      PT LONG TERM GOAL #2   Title Active left knee flexion to 120 degrees so the patient can perform functional tasks and do so with pain not > 2-3/10.    Time 4    Period Weeks    Status New      PT LONG TERM GOAL #3   Title Perform ADL's with left knee pain not > 4/10.    Time 4    Period Weeks    Status New                  Plan - 08/04/20 1207    Clinical Impression Statement Patient more hesistant due to L hip pain which she is to see next week for an injection. Patient did not have DVT after imaging completed last week. Patient able to tolerate light therex and strengthening well although some discomfort reported intermittantly with hip discomfort. Normal stimulation response noted following removal of the modality.    Personal Factors and Comorbidities Comorbidity 1;Comorbidity 2    Comorbidities Left THA (2016), shoulder surgery, ankle surgery, arthritis, Bipolar, HTN, neuropathy, foot pain, cholecystectomy.    Examination-Activity Limitations Other;Locomotion Level    Examination-Participation Restrictions Other    Stability/Clinical Decision Making Evolving/Moderate complexity    Rehab Potential Good    PT Frequency 3x / week    PT Duration 4 weeks    PT Treatment/Interventions ADLs/Self Care Home Management;Cryotherapy;Electrical Stimulation;Ultrasound;Moist Heat;Iontophoresis 4mg /ml Dexamethasone;Gait training;Stair training;Functional mobility training;Therapeutic activities;Therapeutic exercise;Balance training;Manual techniques;Patient/family education;Passive range of motion    PT Next Visit Plan Nustep, pain-free LE ther ex, modalites to left knee as needed.  Rockerboard.  Monitored gait at all times.    Consulted and Agree with Plan of Care Patient           Patient will benefit from skilled therapeutic intervention in order to improve the following deficits and impairments:  Pain,Decreased activity tolerance,Decreased strength  Visit Diagnosis: Chronic pain of left knee  Stiffness of left knee, not elsewhere classified  Muscle weakness (generalized)  Unsteadiness on feet     Problem List Patient Active Problem List   Diagnosis Date Noted  . Constipation by outlet obstruction 11/27/2019  . Esophageal reflux 11/27/2019  . History of colon polyps 11/27/2019  .  Peripheral neuropathy 11/27/2019  . Vitamin D deficiency 11/27/2019  . Heartburn 09/07/2019  . Obstructive sleep apnea on CPAP 09/07/2019  . Renal cyst 08/20/2019  . Urge incontinence 08/20/2019  . Bipolar disorder, current episode depressed, moderate (HCC) 04/28/2019  . Psychomotor agitation 04/28/2019  . COVID-19 04/19/2019  . Hypernatremia 04/19/2019  .  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 Neck pain 10/07/2018  . Severe obesity (BMI 35.0-39.9) with comorbidity (HCC) 07/05/2018  . Chronic kidney disease (CKD), stage III (moderate) (HCC) 07/05/2018  . Plantar fasciitis 10/22/2015  . Torn tendon 10/22/2015  . OA (osteoarthritis) of hip 03/18/2015  . Hyperlipidemia 08/06/2014  . Migraine with aura 02/28/2014  . Hypertension 09/27/2013  . Follow-up exam after treatment 09/27/2013  . Eye problem 09/13/2013  . Bipolar affective disorder [facility]) 04/05/2012    Marvell Fuller, PTA 08/04/2020, 12:13 PM  [facility] [address] [place], [place], [address] Phone: [phone]   Fax:  [phone]  Name: Dana [name] MRN: [id number] Date of Birth: Oct 10, 1960

## 2020-08-07 ENCOUNTER — Encounter: Payer: Medicare HMO | Admitting: Physical Therapy

## 2020-08-10 ENCOUNTER — Ambulatory Visit: Payer: Medicare HMO | Admitting: Physical Therapy

## 2020-08-11 ENCOUNTER — Ambulatory Visit: Payer: Medicare HMO | Admitting: Psychiatry

## 2020-08-11 ENCOUNTER — Encounter: Payer: Medicare HMO | Admitting: Physical Therapy

## 2020-08-12 ENCOUNTER — Telehealth: Payer: Self-pay | Admitting: Psychiatry

## 2020-08-12 DIAGNOSIS — F319 Bipolar disorder, unspecified: Secondary | ICD-10-CM

## 2020-08-12 MED ORDER — QUETIAPINE FUMARATE 50 MG PO TABS: ORAL_TABLET | ORAL | 1 refills | Status: DC

## 2020-08-12 NOTE — Addendum Note (Signed)
Addended by: Derenda Mis on: 08/12/2020 04:16 PM   Modules accepted: Orders

## 2020-08-12 NOTE — Telephone Encounter (Signed)
Next visit is 09/11/20. Dana [name] called and said that the Abilify is not working for her. Since she started on the Abilify and quit taking the Depakote, she hasn't been able to sleep, has headaches and is nauseous. She wants to know if she needs to make a change in the medications? Her phone # is [phone]. Her pharmacy is:  [id number] - [place], [place] - [address] Phone:  [phone]  Fax:  [phone]

## 2020-08-12 NOTE — Telephone Encounter (Signed)
Will discontinue Abilify and start Seroquel. Take 1/2 tablet at bedtime for 2 days, then increase to 1 tablet at bedtime for 2 days, then 2 tabs po QHS for 2 days, then 4 tabs po QHS.   Please advise pt to stop Abilify. Please let her know that Seroquel has been sent to her pharmacy with instructions to start at low dose and gradually increase dose over the next week. Please tell her to take it at bedtime and that this should help with her sleep. Please advise her to call with an update on how she is doing in one week.

## 2020-08-13 NOTE — Telephone Encounter (Signed)
Rtc to pt and discussed medication. She reported she picked up the Seroquel last night but read all the side effects so that concerned her. I discussed those with her and advised her that they have to list all of those but it doesn't mean she will get them. She reports talking to the pharmacist about her Abilify and he told her that Abilify would take at least a month to work and if she wanted to give that more time. She reports having an injection 2 days ago so the headache may have been from that. At first she said she would continue Abilify but when we got off the phone she reports she will try the new one instead. Pt was back and forth. She also was at her neurologist apt when I called. Advised her to call if she has any issues or further concerns.

## 2020-08-14 ENCOUNTER — Ambulatory Visit (INDEPENDENT_AMBULATORY_CARE_PROVIDER_SITE_OTHER): Payer: Medicare HMO

## 2020-08-14 ENCOUNTER — Ambulatory Visit (INDEPENDENT_AMBULATORY_CARE_PROVIDER_SITE_OTHER): Payer: Medicare HMO | Admitting: Podiatry

## 2020-08-14 ENCOUNTER — Encounter: Payer: Medicare HMO | Admitting: Physical Therapy

## 2020-08-14 ENCOUNTER — Other Ambulatory Visit: Payer: Self-pay

## 2020-08-14 DIAGNOSIS — M25571 Pain in right ankle and joints of right foot: Secondary | ICD-10-CM

## 2020-08-14 DIAGNOSIS — M779 Enthesopathy, unspecified: Secondary | ICD-10-CM | POA: Diagnosis not present

## 2020-08-14 DIAGNOSIS — W19XXXA Unspecified fall, initial encounter: Secondary | ICD-10-CM | POA: Diagnosis not present

## 2020-08-14 DIAGNOSIS — S93401A Sprain of unspecified ligament of right ankle, initial encounter: Secondary | ICD-10-CM | POA: Diagnosis not present

## 2020-08-16 NOTE — Progress Notes (Signed)
Subjective: 60 year old female presents the office with concerns of right ankle pain.  She states that she has fallen twice, once in December 1 last week which she twisted her ankle.  She states that she has had increased pain since the last fall last week.  She said no recent treatment.  Regards to the weakness to her legs as well as numbness she recently had an injection in her back.  Also given her knee issues this is making her unsteady as well as her back she reports. Denies any systemic complaints such as fevers, chills, nausea, vomiting. No acute changes since last appointment, and no other complaints at this time.   Objective: AAO x3, NAD DP/PT pulses palpable bilaterally, CRT less than 3 seconds There is tenderness palpation of lateral aspect of the right ankle mostly along distal fibula and along the course the peroneal tendon also matured with some of fibula.  Is mild edema there is no erythema or warmth.  No gross ankle instability is present.  No pain to the tibia, fibula otherwise no proximal tib-fib pain.  No pain to the talus.there is discomfort on the lateral fifth metatarsal.  No pain the other metatarsals.  No pain with calf compression, swelling, warmth, erythema  Assessment: Likely sprain right foot  Plan: -All treatment options discussed with the patient including all alternatives, risks, complications.  -X-ray today reviewed.  No evidence of acute fracture.  I independently reviewed the x-rays myself and awaiting radiology report. -Given the discomfort immobilization in a cam boot. -Ice and elevation -Patient encouraged to call the office with any questions, concerns, change in symptoms.   Vivi Barrack DPM

## 2020-08-19 ENCOUNTER — Encounter: Payer: Self-pay | Admitting: Physical Therapy

## 2020-08-19 ENCOUNTER — Other Ambulatory Visit: Payer: Self-pay

## 2020-08-19 ENCOUNTER — Ambulatory Visit: Payer: Medicare HMO | Admitting: Physical Therapy

## 2020-08-19 DIAGNOSIS — M6281 Muscle weakness (generalized): Secondary | ICD-10-CM

## 2020-08-19 DIAGNOSIS — R2681 Unsteadiness on feet: Secondary | ICD-10-CM

## 2020-08-19 DIAGNOSIS — G8929 Other chronic pain: Secondary | ICD-10-CM

## 2020-08-19 DIAGNOSIS — M25562 Pain in left knee: Secondary | ICD-10-CM | POA: Diagnosis not present

## 2020-08-19 DIAGNOSIS — M25662 Stiffness of left knee, not elsewhere classified: Secondary | ICD-10-CM

## 2020-08-19 NOTE — Therapy (Signed)
[facility] [address] [place], [place], [address] Phone: [phone]   Fax:  [phone]  Physical Therapy Treatment  Patient Details  Name: Dana [name] MRN: [id number] Date of Birth: 1960/11/12 Referring Provider (PT): Ollen Gross MD   Encounter Date: 08/19/2020   PT End of Session - 08/19/20 1131    Visit Number 4    Number of Visits 12    Date for PT Re-Evaluation 09/07/20    Authorization Type FOTO AT LEAST EVERY 5TH VISIT.  PROGRESS NOTE AT 10TH VISIT.  KX MODIFIER AFTER 15 VISITS.    PT Start Time 1130    PT Stop Time 1210    PT Time Calculation (min) 40 min    Activity Tolerance Patient tolerated treatment well    Behavior During Therapy WFL for tasks assessed/performed           Past Medical History:  Diagnosis Date  . Arthritis    oa  . Bipolar disorder (HCC)   . Chronic foot pain   . Complication of anesthesia   . GERD (gastroesophageal reflux disease)   . Headache    migraines  . Hyperlipidemia   . Hypertension   . Neuropathy   . Palpitations last 1 month ago  . PONV (postoperative nausea and vomiting)   . Sleep apnea     Past Surgical History:  Procedure Laterality Date  . ABDOMINAL HYSTERECTOMY  1998   complete  . CESAREAN SECTION  1991  . CHOLECYSTECTOMY  2005  . FOOT SURGERY    . left hip muscle tear surgery  2008  . shoulder scope Left 2015  . 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 TUBAL LIGATION  1992    There were no vitals filed for this visit.   Subjective Assessment - 08/19/20 1131    Subjective COVID-19 screen performed prior to patient entering clinic. Patient reports she fell on sunday right on her left knee. States she went to urgent care and stated no fractures but    Pertinent History Left THA (2016), shoulder surgery,  ankle surgery, arthritis, Bipolar, HTN, neuropathy, foot pain, cholecystectomy.    How long can you walk comfortably? Short community distances.    Patient Stated Goals Walk good again and not fall.    Currently in Pain? Yes    Pain Score 6     Pain Location Knee    Pain Orientation Left    Pain Descriptors / Indicators Sore    Pain Type Acute pain              [facility] PT Assessment - 08/19/20 0001      Assessment   Medical Diagnosis Left knee pain.    Referring Provider (PT) Ollen Gross MD      Precautions   Precautions Fall    Precaution Comments Monitored 02 sat.  Recommended patient use assisitve device for safety. Supervised gait at all times in clinic.  Left THA.      Restrictions   Weight Bearing Restrictions No                         [facility] Adult PT Treatment/Exercise - 08/19/20 0001      Exercises   Exercises Knee/Hip      Knee/Hip Exercises: Aerobic   Nustep L1 x11 min  Knee/Hip Exercises: Supine   Short Arc Quad Sets Strengthening;Left;2 sets;10 reps    Bridges 10 reps    Other Supine Knee/Hip Exercises clam shells x10      Modalities   Modalities Electrical Stimulation      Electrical Stimulation   Electrical Stimulation Location Left knee.    Electrical Stimulation Action IFC    Electrical Stimulation Parameters 80-150 hz x10    Electrical Stimulation Goals Pain                       PT Long Term Goals - 07/27/20 1245      PT LONG TERM GOAL #1   Title Independent with a HEP.    Time 4    Period Weeks    Status New      PT LONG TERM GOAL #2   Title Active left knee flexion to 120 degrees so the patient can perform functional tasks and do so with pain not > 2-3/10.    Time 4    Period Weeks    Status New      PT LONG TERM GOAL #3   Title Perform ADL's with left knee pain not > 4/10.    Time 4    Period Weeks    Status New                 Plan - 08/19/20 1218    Clinical Impression Statement  Patient responded fairly well to therapy session but with pain secondary to falling on her left knee. Patient guided though supine TEs with excellent response and no reports of pain. Patient provided with HEP with reports of understanding. Normal response to modality upon removal.    Personal Factors and Comorbidities Comorbidity 1;Comorbidity 2    Comorbidities Left THA (2016), shoulder surgery, ankle surgery, arthritis, Bipolar, HTN, neuropathy, foot pain, cholecystectomy.    Examination-Activity Limitations Other;Locomotion Level    Examination-Participation Restrictions Other    Stability/Clinical Decision Making Evolving/Moderate complexity    Clinical Decision Making Moderate    Rehab Potential Good    PT Frequency 3x / week    PT Duration 4 weeks    PT Treatment/Interventions ADLs/Self Care Home Management;Cryotherapy;Electrical Stimulation;Ultrasound;Moist Heat;Iontophoresis 4mg /ml Dexamethasone;Gait training;Stair training;Functional mobility training;Therapeutic activities;Therapeutic exercise;Balance training;Manual techniques;Patient/family education;Passive range of motion    PT Next Visit Plan Nustep, pain-free LE ther ex, modalites to left knee as needed.  Rockerboard.  Monitored gait at all times.    Consulted and Agree with Plan of Care Patient           Patient will benefit from skilled therapeutic intervention in order to improve the following deficits and impairments:  Pain,Decreased activity tolerance,Decreased strength  Visit Diagnosis: Chronic pain of left knee  Stiffness of left knee, not elsewhere classified  Muscle weakness (generalized)  Unsteadiness on feet     Problem List Patient Active Problem List   Diagnosis Date Noted  . Constipation by outlet obstruction 11/27/2019  . Esophageal reflux 11/27/2019  . History of colon polyps 11/27/2019  . Peripheral neuropathy 11/27/2019  . Vitamin D deficiency 11/27/2019  . Heartburn 09/07/2019  . Obstructive  sleep apnea on CPAP 09/07/2019  . Renal cyst 08/20/2019  . Urge incontinence 08/20/2019  . Bipolar disorder, current episode depressed, moderate (HCC) 04/28/2019  . Psychomotor agitation 04/28/2019  . COVID-19 04/19/2019  . Hypernatremia 04/19/2019  . 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 Neck pain 10/07/2018  . Severe obesity (BMI 35.0-39.9) with comorbidity (HCC) 07/05/2018  .  Chronic kidney disease (CKD), stage III (moderate) (HCC) 07/05/2018  . Plantar fasciitis 10/22/2015  . Torn tendon 10/22/2015  . OA (osteoarthritis) of hip 03/18/2015  . Hyperlipidemia 08/06/2014  . Migraine with aura 02/28/2014  . Hypertension 09/27/2013  . Follow-up exam after treatment 09/27/2013  . Eye problem 09/13/2013  . Bipolar affective disorder (HCC) 04/05/2012    Guss Bunde, PT, DPT 08/19/2020, 12:21 PM  [facility] [address] [place], [place], [address] Phone: [phone]   Fax:  [phone]  Name: FLANNERY CAVALLERO MRN: [id number] Date of Birth: Jun 26, 1960

## 2020-08-20 ENCOUNTER — Telehealth: Payer: Self-pay | Admitting: Psychiatry

## 2020-08-20 DIAGNOSIS — F319 Bipolar disorder, unspecified: Secondary | ICD-10-CM

## 2020-08-20 MED ORDER — DIVALPROEX SODIUM ER 500 MG PO TB24: 500.0000 mg | ORAL_TABLET | Freq: Every day | ORAL | 0 refills | Status: DC

## 2020-08-20 NOTE — Telephone Encounter (Signed)
Returned call to to pt.   She reports that she was not able to sleep with Abilify and had a severe headache.   She reports that she tried Seroquel and it was not helpful. She reports that she has been unable to sleep and had severe headaches. She reports severe restlessness and cannot sit still. She reports that she is not sleeping more than 6 hours a night. She reports that Xanax has not been helpful for her sleep. She denies manic s/s, depression, or SI.   She reports that she has had 2 additional falls since last visit. She reports that she fell recently reaching for something.  She requests to go back on Depakote 500 mg. She reports that Depakote 500 mg was well tolerated and seemed to be helpful for mood.   Will d/c Seroquel and re-start Depakote ER 500 mg at bedtime.   Patient advised to contact office with any questions, adverse effects, or acute worsening in signs and symptoms.

## 2020-08-20 NOTE — Addendum Note (Signed)
Addended by: Derenda Mis on: 08/20/2020 06:33 PM   Modules accepted: Orders

## 2020-08-20 NOTE — Telephone Encounter (Signed)
Pt called requesting you to call her back. She is asking to stop seroquel and she said abilify but I don't see that med. And start depakote. She needs to talk to you. APT 4/8  CONTACT # [phone]

## 2020-08-25 ENCOUNTER — Ambulatory Visit: Payer: Medicare HMO | Admitting: Physical Therapy

## 2020-08-25 ENCOUNTER — Telehealth: Payer: Self-pay | Admitting: Podiatry

## 2020-08-25 ENCOUNTER — Encounter: Payer: Self-pay | Admitting: Physical Therapy

## 2020-08-25 ENCOUNTER — Other Ambulatory Visit: Payer: Self-pay

## 2020-08-25 DIAGNOSIS — M25562 Pain in left knee: Secondary | ICD-10-CM

## 2020-08-25 DIAGNOSIS — M25662 Stiffness of left knee, not elsewhere classified: Secondary | ICD-10-CM

## 2020-08-25 DIAGNOSIS — G8929 Other chronic pain: Secondary | ICD-10-CM

## 2020-08-25 DIAGNOSIS — M6281 Muscle weakness (generalized): Secondary | ICD-10-CM

## 2020-08-25 DIAGNOSIS — R2681 Unsteadiness on feet: Secondary | ICD-10-CM

## 2020-08-25 NOTE — Telephone Encounter (Signed)
Called patient to give number of [facility] office [phone], stated she is looking for a coat that was left

## 2020-08-25 NOTE — Telephone Encounter (Signed)
Patient called in regarding a jacket that was left at the office in [place] and stated someone tried to contact her to pick it up, Please Advise

## 2020-08-25 NOTE — Telephone Encounter (Signed)
It was probably the [place] off the called. Can you give her the number of the front desk there?

## 2020-08-25 NOTE — Therapy (Signed)
[facility] [address] [place], [place], [address] Phone: [phone]   Fax:  [phone]  Physical Therapy Treatment  Patient Details  Name: Dana [name] MRN: [id number] Date of Birth: 1960-10-21 Referring Provider (PT): Ollen Gross MD   Encounter Date: 08/25/2020   PT End of Session - 08/25/20 1135    Visit Number 5    Number of Visits 12    Date for PT Re-Evaluation 09/07/20    Authorization Type FOTO AT LEAST EVERY 5TH VISIT.  PROGRESS NOTE AT 10TH VISIT.  KX MODIFIER AFTER 15 VISITS.    PT Start Time 1118    PT Stop Time 1200    PT Time Calculation (min) 42 min    Activity Tolerance Patient tolerated treatment well    Behavior During Therapy WFL for tasks assessed/performed           Past Medical History:  Diagnosis Date  . Arthritis    oa  . Bipolar disorder (HCC)   . Chronic foot pain   . Complication of anesthesia   . GERD (gastroesophageal reflux disease)   . Headache    migraines  . Hyperlipidemia   . Hypertension   . Neuropathy   . Palpitations last 1 month ago  . PONV (postoperative nausea and vomiting)   . Sleep apnea     Past Surgical History:  Procedure Laterality Date  . ABDOMINAL HYSTERECTOMY  1998   complete  . CESAREAN SECTION  1991  . CHOLECYSTECTOMY  2005  . FOOT SURGERY    . left hip muscle tear surgery  2008  . shoulder scope Left 2015  . 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 TUBAL LIGATION  1992    There were no vitals filed for this visit.   Subjective Assessment - 08/25/20 1135    Subjective COVID-19 screen performed prior to patient entering clinic. Patient reports she is going to [place] to visit her son and is nervous due to falls.    Pertinent History Left THA (2016), shoulder surgery, ankle surgery, arthritis, Bipolar, HTN,  neuropathy, foot pain, cholecystectomy.    How long can you walk comfortably? Short community distances.    Patient Stated Goals Walk good again and not fall.    Currently in Pain? Yes    Pain Score --   No rating provided   Pain Location Knee    Pain Orientation Left    Pain Descriptors / Indicators Discomfort    Pain Type Acute pain    Pain Onset More than a month ago    Pain Frequency Constant              [facility] PT Assessment - 08/25/20 0001      Assessment   Medical Diagnosis Left knee pain.    Referring Provider (PT) Ollen Gross MD      Precautions   Precautions Fall    Precaution Comments Monitored 02 sat.  Recommended patient use assisitve device for safety. Supervised gait at all times in clinic.  Left THA.      Restrictions   Weight Bearing Restrictions No                         [facility] Adult PT Treatment/Exercise - 08/25/20 0001      Knee/Hip Exercises: Aerobic   Nustep  L4 x17 min      Knee/Hip Exercises: Standing   Heel Raises Both;2 sets;10 reps    Heel Raises Limitations B toe raise x20 reps    Hip Abduction Stengthening;Both;2 sets;10 reps    Forward Step Up Both;2 sets;10 reps;Hand Hold: 2;Step Height: 6"      Knee/Hip Exercises: Seated   Long Arc Quad Strengthening;Both;2 sets;10 reps;Weights    Long Arc Quad Weight 3 lbs.    Clamshell with TheraBand Green   x30 reps   Hamstring Curl Strengthening;Left;10 reps;2 sets;Limitations    Hamstring Limitations green theraband                       PT Long Term Goals - 07/27/20 1245      PT LONG TERM GOAL #1   Title Independent with a HEP.    Time 4    Period Weeks    Status New      PT LONG TERM GOAL #2   Title Active left knee flexion to 120 degrees so the patient can perform functional tasks and do so with pain not > 2-3/10.    Time 4    Period Weeks    Status New      PT LONG TERM GOAL #3   Title Perform ADL's with left knee pain not > 4/10.    Time 4     Period Weeks    Status New                 Plan - 08/25/20 1232    Clinical Impression Statement Patient presented in clinic and reports thinking of using AD today prior to PT but may use WC during airport stops. Patient able to tolerate progression to stairs and light strengthening as well. No reports of increased pain other than when patient was initially seated for exercises but gave no other reports following. Patient encouraged to be careful and use her judgement at the airports.    Personal Factors and Comorbidities Comorbidity 1;Comorbidity 2    Comorbidities Left THA (2016), shoulder surgery, ankle surgery, arthritis, Bipolar, HTN, neuropathy, foot pain, cholecystectomy.    Examination-Activity Limitations Other;Locomotion Level    Examination-Participation Restrictions Other    Stability/Clinical Decision Making Evolving/Moderate complexity    Rehab Potential Good    PT Frequency 3x / week    PT Duration 4 weeks    PT Treatment/Interventions ADLs/Self Care Home Management;Cryotherapy;Electrical Stimulation;Ultrasound;Moist Heat;Iontophoresis 4mg /ml Dexamethasone;Gait training;Stair training;Functional mobility training;Therapeutic activities;Therapeutic exercise;Balance training;Manual techniques;Patient/family education;Passive range of motion    PT Next Visit Plan Nustep, pain-free LE ther ex, modalites to left knee as needed.  Rockerboard.  Monitored gait at all times.    Consulted and Agree with Plan of Care Patient           Patient will benefit from skilled therapeutic intervention in order to improve the following deficits and impairments:  Pain,Decreased activity tolerance,Decreased strength  Visit Diagnosis: Chronic pain of left knee  Stiffness of left knee, not elsewhere classified  Muscle weakness (generalized)  Unsteadiness on feet     Problem List Patient Active Problem List   Diagnosis Date Noted  . Constipation by outlet obstruction 11/27/2019   . Esophageal reflux 11/27/2019  . History of colon polyps 11/27/2019  . Peripheral neuropathy 11/27/2019  . Vitamin D deficiency 11/27/2019  . Heartburn 09/07/2019  . Obstructive sleep apnea on CPAP 09/07/2019  . Renal cyst 08/20/2019  . Urge incontinence 08/20/2019  . Bipolar disorder, current  episode depressed, moderate (HCC) 04/28/2019  . Psychomotor agitation 04/28/2019  . COVID-19 04/19/2019  . Hypernatremia 04/19/2019  . 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 Neck pain 10/07/2018  . Severe obesity (BMI 35.0-39.9) with comorbidity (HCC) 07/05/2018  . Chronic kidney disease (CKD), stage III (moderate) (HCC) 07/05/2018  . Plantar fasciitis 10/22/2015  . Torn tendon 10/22/2015  . OA (osteoarthritis) of hip 03/18/2015  . Hyperlipidemia 08/06/2014  . Migraine with aura 02/28/2014  . Hypertension 09/27/2013  . Follow-up exam after treatment 09/27/2013  . Eye problem 09/13/2013  . Bipolar affective disorder [facility]) 04/05/2012    Marvell Fuller, PTA 08/25/2020, 12:35 PM  [facility] [address] [place], [place], [address] Phone: [phone]   Fax:  [phone]  Name: Dana [name] MRN: [id number] Date of Birth: 1961-01-20

## 2020-08-25 NOTE — Telephone Encounter (Signed)
Yes, sure 

## 2020-08-27 ENCOUNTER — Encounter: Payer: Medicare HMO | Admitting: Physical Therapy

## 2020-08-28 ENCOUNTER — Ambulatory Visit: Payer: Medicare HMO | Admitting: Podiatry

## 2020-09-08 ENCOUNTER — Ambulatory Visit: Payer: Medicare HMO | Admitting: *Deleted

## 2020-09-10 ENCOUNTER — Encounter: Payer: Medicare HMO | Admitting: Physical Therapy

## 2020-09-11 ENCOUNTER — Ambulatory Visit: Payer: Medicare HMO | Admitting: Psychiatry

## 2020-10-02 ENCOUNTER — Ambulatory Visit: Payer: Medicare HMO | Admitting: Podiatry

## 2020-10-09 ENCOUNTER — Other Ambulatory Visit: Payer: Self-pay

## 2020-10-09 ENCOUNTER — Encounter: Payer: Self-pay | Admitting: Psychiatry

## 2020-10-09 ENCOUNTER — Ambulatory Visit (INDEPENDENT_AMBULATORY_CARE_PROVIDER_SITE_OTHER): Payer: Medicare HMO | Admitting: Psychiatry

## 2020-10-09 DIAGNOSIS — F5101 Primary insomnia: Secondary | ICD-10-CM | POA: Diagnosis not present

## 2020-10-09 DIAGNOSIS — F419 Anxiety disorder, unspecified: Secondary | ICD-10-CM

## 2020-10-09 DIAGNOSIS — F319 Bipolar disorder, unspecified: Secondary | ICD-10-CM | POA: Diagnosis not present

## 2020-10-09 MED ORDER — DIVALPROEX SODIUM ER 250 MG PO TB24
250.0000 mg | ORAL_TABLET | Freq: Every day | ORAL | 3 refills | Status: DC
Start: 2020-10-09 — End: 2021-01-20

## 2020-10-09 MED ORDER — ALPRAZOLAM 0.5 MG PO TABS: 0.5000 mg | ORAL_TABLET | Freq: Two times a day (BID) | ORAL | 2 refills | Status: DC | PRN

## 2020-10-09 MED ORDER — DIVALPROEX SODIUM ER 500 MG PO TB24: 500.0000 mg | ORAL_TABLET | Freq: Every day | ORAL | 3 refills | Status: DC

## 2020-10-09 NOTE — Progress Notes (Signed)
Dana [name] [id number] 15-Aug-1960 60 y.o.  Subjective:   Patient ID:  Dana [name] is a 60 y.o. (DOB 12/03/1960) female.  Chief Complaint:  Chief Complaint  Patient presents with  . Depression    HPI Dana [name] presents to the office today for follow-up of mood disturbance, anxiety, and insomnia. She reports that she has been depressed. She reports that she would like to increase dose of Depakote since mood has been more depressed with lower dose. Describes her mind as "balnk." She reports that she has had some anxiety and worry. She reports that she has had to take Xanax prn in the morning the last 2 days. Denies any manic s/s. She reports that she has not been able to save as much as she would like due to needing walk-in shower put in, paying taxes, etc. Energy and motivation have been low. She reports that she was having some early morning awakenings and sleep is now good. She reports that her appetite is good and would like to lose weight. Concentration has been ok. Denies SI.   She reports that she has been dating someone and "I don't think he's good for me." She reports that she enjoys his company but has concerns about his substance use. She reports that she has been drinking alcohol on occasion since she started dating.   Son deployed in March. She flew to [place] to see her son before he deployed after he asked to see her. She has been in touch with other son again. Has also reconnected with an old friend.   She joined [employer]. She plans to start water aerobics today. She reports that she has been working on her spare bedroom. She has been crocheting. Has been baking pound cakes for friends and family.   Past Psychiatric Medication Trials: Lithium-has taken since age 35. Was on 600 mg twice daily for most of this duration Carbamazepine-flulike signs and symptoms, body aches, headaches, dizziness, and itching Gabapentin Depakote- Unsteadiness,  falls Latuda-headaches, itching Xanax Hydroxyzine    Review of Systems:  Review of Systems  Genitourinary:       Had botox injections for bladder on Wednesday  Musculoskeletal: Negative for gait problem.       Last fall was in February. Injured knee.   Neurological: Negative for tremors.  Psychiatric/Behavioral:       Please refer to HPI     She reports that she drinks over a gallon of water daily.   Medications: I have reviewed the patient's current medications.  Current Outpatient Medications  Medication Sig Dispense Refill  . Acetaminophen (TYLENOL) 325 MG CAPS 3 capsules    . albuterol (PROVENTIL HFA;VENTOLIN HFA) 108 (90 Base) MCG/ACT inhaler Inhale into the lungs.    . Cholecalciferol (VITAMIN D) 2000 units tablet Take by mouth.    . diclofenac sodium (VOLTAREN) 1 % GEL Apply 2 g topically 4 (four) times daily. Rub into affected area of foot 2 to 4 times daily 100 g 2  . divalproex (DEPAKOTE ER) 250 MG 24 hr tablet Take 1 tablet (250 mg total) by mouth daily. Take with a 500 mg tablet to equal total dose of 750 mg 30 tablet 3  . esomeprazole (NEXIUM) 40 MG capsule Take 40 mg by mouth daily as needed.     . fesoterodine (TOVIAZ) 8 MG TB24 tablet Take by mouth.    . gabapentin (NEURONTIN) 300 MG capsule Take 300 mg by mouth 3 (three) times daily.    Marland Kitchen  ondansetron (ZOFRAN) 4 MG tablet as needed.    Marland Kitchen oxyCODONE (OXY IR/ROXICODONE) 5 MG immediate release tablet oxycodone 5 mg tablet    . Probiotic Product (ALIGN PO) Take by mouth.    . rosuvastatin (CRESTOR) 5 MG tablet Take 5 mg daily by mouth.    . sucralfate (CARAFATE) 1 g tablet     . ALPRAZolam (XANAX) 0.5 MG tablet Take 1 tablet (0.5 mg total) by mouth 2 (two) times daily as needed for anxiety. 60 tablet 2  . cyclobenzaprine (FLEXERIL) 10 MG tablet Take 1 tablet (10 mg total) by mouth 2 (two) times daily as needed for muscle spasms. (Patient not taking: No sig reported) 10 tablet 0  . divalproex (DEPAKOTE ER) 500 MG 24  hr tablet Take 1 tablet (500 mg total) by mouth at bedtime. Take with a 250 mg tablet to equal total dose of 750 mg 30 tablet 3  . promethazine (PHENERGAN) 12.5 MG tablet     . triamcinolone acetonide (KENALOG-40) 40 MG/ML injection (RADIOLOGY ONLY) Inject into the articular space.    . Triamcinolone Acetonide 0.025 % LOTN triamcinolone acetonide     No current facility-administered medications for this visit.    Medication Side Effects: None  Allergies:  Allergies  Allergen Reactions  . Anesthetics, Amide Nausea And Vomiting  . Other   . Oxybutynin Other (See Comments)  . Codeine Nausea And Vomiting and Nausea Only    Past Medical History:  Diagnosis Date  . Arthritis    oa  . Bipolar disorder (HCC)   . Chronic foot pain   . Complication of anesthesia   . GERD (gastroesophageal reflux disease)   . Headache    migraines  . Hyperlipidemia   . Hypertension   . Neuropathy   . Palpitations last 1 month ago  . PONV (postoperative nausea and vomiting)   . Sleep apnea     Past Medical History, Surgical history, Social history, and Family history were reviewed and updated as appropriate.   Please see review of systems for further details on the patient's review from today.   Objective:   Physical Exam:  Wt 250 lb (113.4 kg)   BMI 40.35 kg/m   Physical Exam Constitutional:      General: She is not in acute distress. Musculoskeletal:        General: No deformity.  Neurological:     Mental Status: She is alert and oriented to person, place, and time.     Coordination: Coordination normal.  Psychiatric:        Attention and Perception: Attention and perception normal. She does not perceive auditory or visual hallucinations.        Mood and Affect: Mood is anxious and depressed. Affect is not labile, blunt, angry or inappropriate.        Speech: Speech normal.        Behavior: Behavior normal.        Thought Content: Thought content normal. Thought content is not  paranoid or delusional. Thought content does not include homicidal or suicidal ideation. Thought content does not include homicidal or suicidal plan.        Cognition and Memory: Cognition and memory normal.        Judgment: Judgment normal.     Comments: Insight intact     Lab Review:     Component Value Date/Time   NA 145 12/16/2019 1000   K 4.2 12/16/2019 1000   CL 111 12/16/2019 1000   CO2  26 12/16/2019 1000   GLUCOSE 94 12/16/2019 1000   BUN 17 12/16/2019 1000   CREATININE 1.60 (H) 12/16/2019 1000   CREATININE 1.57 (H) 02/20/2019 0934   CALCIUM 9.8 12/16/2019 1000   PROT 6.8 12/16/2019 1000   ALBUMIN 4.5 12/16/2019 1000   AST 19 12/16/2019 1000   ALT 18 12/16/2019 1000   ALKPHOS 81 12/16/2019 1000   BILITOT 0.4 12/16/2019 1000   GFRNONAA 55 (L) 03/19/2015 0533   GFRAA >60 03/19/2015 0533       Component Value Date/Time   WBC 15.4 (H) 03/19/2015 0533   RBC 3.96 03/19/2015 0533   HGB 11.5 (L) 03/19/2015 0533   HCT 35.0 (L) 03/19/2015 0533   PLT 233 03/19/2015 0533   MCV 88.4 03/19/2015 0533   MCH 29.0 03/19/2015 0533   MCHC 32.9 03/19/2015 0533   RDW 13.6 03/19/2015 0533    Lithium Lvl  Date Value Ref Range Status  02/20/2019 0.8 0.6 - 1.2 mmol/L Final     No results found for: PHENYTOIN, PHENOBARB, VALPROATE, CBMZ   .res Assessment: Plan:   Pt seen for 30 minutes and time spent counseling pt regarding option to increase Depakote ER to 750 mg at bedtime since she reports that depressive s/s have worsened with lower dose in Depakote ER. Will increase Depakote ER to 750 mg at bedtime to improve mood s/s.  Continue Xanax prn anxiety.  Pt to follow-up in 3 months or sooner if clinically indicated.  Patient advised to contact office with any questions, adverse effects, or acute worsening in signs and symptoms.   Dana [name] was seen today for depression.  Diagnoses and all orders for this visit:  Bipolar I disorder (HCC) -     divalproex (DEPAKOTE ER) 250 MG 24  hr tablet; Take 1 tablet (250 mg total) by mouth daily. Take with a 500 mg tablet to equal total dose of 750 mg -     divalproex (DEPAKOTE ER) 500 MG 24 hr tablet; Take 1 tablet (500 mg total) by mouth at bedtime. Take with a 250 mg tablet to equal total dose of 750 mg  Anxiety disorder, unspecified type -     ALPRAZolam (XANAX) 0.5 MG tablet; Take 1 tablet (0.5 mg total) by mouth 2 (two) times daily as needed for anxiety.  Primary insomnia -     ALPRAZolam (XANAX) 0.5 MG tablet; Take 1 tablet (0.5 mg total) by mouth 2 (two) times daily as needed for anxiety.     Please see After Visit Summary for patient specific instructions.  Future Appointments  Date Time Provider Department Center  11/05/2020  2:15 PM Allena Napoleon, MD [facility] None  01/08/2021 10:30 AM Corie Chiquito, PMHNP [facility] None    No orders of the defined types were placed in this encounter.   -------------------------------

## 2020-11-05 ENCOUNTER — Institutional Professional Consult (permissible substitution): Payer: Medicare HMO | Admitting: Plastic Surgery

## 2021-01-08 ENCOUNTER — Ambulatory Visit: Payer: Medicare HMO | Admitting: Psychiatry

## 2021-01-20 ENCOUNTER — Ambulatory Visit (INDEPENDENT_AMBULATORY_CARE_PROVIDER_SITE_OTHER): Payer: Medicare HMO | Admitting: Psychiatry

## 2021-01-20 ENCOUNTER — Other Ambulatory Visit: Payer: Self-pay

## 2021-01-20 ENCOUNTER — Encounter: Payer: Self-pay | Admitting: Psychiatry

## 2021-01-20 DIAGNOSIS — F5101 Primary insomnia: Secondary | ICD-10-CM

## 2021-01-20 DIAGNOSIS — F319 Bipolar disorder, unspecified: Secondary | ICD-10-CM | POA: Diagnosis not present

## 2021-01-20 DIAGNOSIS — F419 Anxiety disorder, unspecified: Secondary | ICD-10-CM

## 2021-01-20 MED ORDER — DIVALPROEX SODIUM ER 250 MG PO TB24: 250.0000 mg | ORAL_TABLET | Freq: Every day | ORAL | 3 refills | Status: DC

## 2021-01-20 MED ORDER — ALPRAZOLAM 0.5 MG PO TABS: 0.5000 mg | ORAL_TABLET | Freq: Two times a day (BID) | ORAL | 2 refills | Status: DC | PRN

## 2021-01-20 MED ORDER — DIVALPROEX SODIUM ER 500 MG PO TB24: 500.0000 mg | ORAL_TABLET | Freq: Every day | ORAL | 3 refills | Status: DC

## 2021-01-20 NOTE — Progress Notes (Signed)
DELLAMAE ROSAMILIA [id number] 1960-11-01 60 y.o.  Subjective:   Patient ID:  IONE SANDUSKY is a 60 y.o. (DOB 1961/01/13) female.  Chief Complaint:  Chief Complaint  Patient presents with   Follow-up    Mood disturbance and anxiety    HPI EVERLYN FARABAUGH presents to the office today for follow-up of mood disturbance and anxiety. She reports that her mood has been "ok." She reports that Saturday was anniversary of her husband's death and she cried that day. Denies depressed mood. Denies manic s/s. She reports that her anxiety has been well controlled. Taking Xanax to help fall sleep. Sleeping about 8 hours a night. Appetite has been "good." Lost 15 lbs and recently gained a few pounds with church homecoming and evening with friends. Energy and motivation have been ok. Concentration has been ok. Denies SI.  Has been taking swim classes Monday, Wednesday, and Friday. She reports that classes have helped with her stability.   She reports that she feels "lonely" and is currently not dating anyone. She has some close, supportive friends and is close with sisters. He has been having more contact with older son and his family. Younger son is in the [employer].   Xanax last filled on 11/20/20   Past Psychiatric Medication Trials: Lithium-has taken since age 14.  Was on 600 mg twice daily for most of this duration Carbamazepine-flulike signs and symptoms, body aches, headaches, dizziness, and itching Gabapentin Depakote- Unsteadiness, falls Latuda-headaches, itching Xanax Hydroxyzine  Review of Systems:  Review of Systems  Musculoskeletal:  Positive for arthralgias and gait problem.  Neurological:  Positive for numbness.       Neuropathy  Hematological:  Bruises/bleeds easily.  Psychiatric/Behavioral:         Please refer to HPI   Drinks a gallon of water daily.   Medications: I have reviewed the patient's current medications.  Current Outpatient Medications  Medication Sig Dispense  Refill   Acetaminophen (TYLENOL) 325 MG CAPS 3 capsules     Cholecalciferol (VITAMIN D) 2000 units tablet Take by mouth.     diclofenac sodium (VOLTAREN) 1 % GEL Apply 2 g topically 4 (four) times daily. Rub into affected area of foot 2 to 4 times daily 100 g 2   esomeprazole (NEXIUM) 40 MG capsule Take 40 mg by mouth daily as needed.      gabapentin (NEURONTIN) 300 MG capsule Take 300 mg by mouth 3 (three) times daily.     ondansetron (ZOFRAN) 4 MG tablet as needed.     oxyCODONE (OXY IR/ROXICODONE) 5 MG immediate release tablet oxycodone 5 mg tablet     Probiotic Product (ALIGN PO) Take by mouth.     rosuvastatin (CRESTOR) 5 MG tablet Take 5 mg daily by mouth.     sucralfate (CARAFATE) 1 g tablet      albuterol (PROVENTIL HFA;VENTOLIN HFA) 108 (90 Base) MCG/ACT inhaler Inhale into the lungs.     [START ON 03/17/2021] ALPRAZolam (XANAX) 0.5 MG tablet Take 1 tablet (0.5 mg total) by mouth 2 (two) times daily as needed for anxiety. 60 tablet 2   cyclobenzaprine (FLEXERIL) 10 MG tablet Take 1 tablet (10 mg total) by mouth 2 (two) times daily as needed for muscle spasms. (Patient not taking: No sig reported) 10 tablet 0   divalproex (DEPAKOTE ER) 250 MG 24 hr tablet Take 1 tablet (250 mg total) by mouth daily. Take with a 500 mg tablet to equal total dose of 750 mg 30 tablet 3  divalproex (DEPAKOTE ER) 500 MG 24 hr tablet Take 1 tablet (500 mg total) by mouth at bedtime. Take with a 250 mg tablet to equal total dose of 750 mg 30 tablet 3   fesoterodine (TOVIAZ) 8 MG TB24 tablet Take by mouth. (Patient not taking: Reported on 01/20/2021)     triamcinolone acetonide (KENALOG-40) 40 MG/ML injection (RADIOLOGY ONLY) Inject into the articular space.     Triamcinolone Acetonide 0.025 % LOTN triamcinolone acetonide     No current facility-administered medications for this visit.    Medication Side Effects: None  Allergies:  Allergies  Allergen Reactions   Anesthetics, Amide Nausea And Vomiting    Other    Oxybutynin Other (See Comments)   Codeine Nausea And Vomiting and Nausea Only    Past Medical History:  Diagnosis Date   Arthritis    oa   Bipolar disorder (HCC)    Chronic foot pain    Complication of anesthesia    GERD (gastroesophageal reflux disease)    Headache    migraines   Hyperlipidemia    Hypertension    Neuropathy    Palpitations last 1 month ago   PONV (postoperative nausea and vomiting)    Sleep apnea     Past Medical History, Surgical history, Social history, and Family history were reviewed and updated as appropriate.   Please see review of systems for further details on the patient's review from today.   Objective:   Physical Exam:  Wt 253 lb (114.8 kg)   BMI 40.84 kg/m   Physical Exam Constitutional:      General: She is not in acute distress. Musculoskeletal:        General: No deformity.  Neurological:     Mental Status: She is alert and oriented to person, place, and time.     Coordination: Coordination normal.  Psychiatric:        Attention and Perception: Attention and perception normal. She does not perceive auditory or visual hallucinations.        Mood and Affect: Mood normal. Mood is not anxious or depressed. Affect is not labile, blunt, angry or inappropriate.        Speech: Speech normal.        Behavior: Behavior normal.        Thought Content: Thought content normal. Thought content is not paranoid or delusional. Thought content does not include homicidal or suicidal ideation. Thought content does not include homicidal or suicidal plan.        Cognition and Memory: Cognition and memory normal.        Judgment: Judgment normal.     Comments: Insight intact    Lab Review:     Component Value Date/Time   NA 145 12/16/2019 1000   K 4.2 12/16/2019 1000   CL 111 12/16/2019 1000   CO2 26 12/16/2019 1000   GLUCOSE 94 12/16/2019 1000   BUN 17 12/16/2019 1000   CREATININE 1.60 (H) 12/16/2019 1000   CREATININE 1.57 (H)  02/20/2019 0934   CALCIUM 9.8 12/16/2019 1000   PROT 6.8 12/16/2019 1000   ALBUMIN 4.5 12/16/2019 1000   AST 19 12/16/2019 1000   ALT 18 12/16/2019 1000   ALKPHOS 81 12/16/2019 1000   BILITOT 0.4 12/16/2019 1000   GFRNONAA 55 (L) 03/19/2015 0533   GFRAA >60 03/19/2015 0533       Component Value Date/Time   WBC 15.4 (H) 03/19/2015 0533   RBC 3.96 03/19/2015 0533   HGB 11.5 (L)  03/19/2015 0533   HCT 35.0 (L) 03/19/2015 0533   PLT 233 03/19/2015 0533   MCV 88.4 03/19/2015 0533   MCH 29.0 03/19/2015 0533   MCHC 32.9 03/19/2015 0533   RDW 13.6 03/19/2015 0533    Lithium Lvl  Date Value Ref Range Status  02/20/2019 0.8 0.6 - 1.2 mmol/L Final     No results found for: PHENYTOIN, PHENOBARB, VALPROATE, CBMZ   .res Assessment: Plan:   Pt seen for 30 minutes and time spent reviewing lab results from PCP and discussing that CBC and liver enzymes are within normal limits. She reports that Depakote ER 750 mg po QHS remains effective and well-tolerated for mood s/s. Will continue Depakote ER 750 mg po QHS for mood stabilization.  Continue Xanax 0.5 mg po BID prn anxiety.  Pt to follow-up in 3 months or sooner if clinically indicated.  Patient advised to contact office with any questions, adverse effects, or acute worsening in signs and symptoms.  Lucie was seen today for follow-up.  Diagnoses and all orders for this visit:  Bipolar I disorder (HCC) -     divalproex (DEPAKOTE ER) 250 MG 24 hr tablet; Take 1 tablet (250 mg total) by mouth daily. Take with a 500 mg tablet to equal total dose of 750 mg -     divalproex (DEPAKOTE ER) 500 MG 24 hr tablet; Take 1 tablet (500 mg total) by mouth at bedtime. Take with a 250 mg tablet to equal total dose of 750 mg  Anxiety disorder, unspecified type -     ALPRAZolam (XANAX) 0.5 MG tablet; Take 1 tablet (0.5 mg total) by mouth 2 (two) times daily as needed for anxiety.  Primary insomnia -     ALPRAZolam (XANAX) 0.5 MG tablet; Take 1 tablet  (0.5 mg total) by mouth 2 (two) times daily as needed for anxiety.    Please see After Visit Summary for patient specific instructions.  Future Appointments  Date Time Provider Department Center  04/22/2021  2:00 PM Corie Chiquito, PMHNP [facility] None    No orders of the defined types were placed in this encounter.   -------------------------------

## 2021-04-16 ENCOUNTER — Emergency Department (HOSPITAL_COMMUNITY)
Admission: EM | Admit: 2021-04-16 | Discharge: 2021-04-16 | Disposition: A | Discharge status: Home / Self Care | Payer: Medicare HMO | Attending: Medical | Admitting: Medical

## 2021-04-16 ENCOUNTER — Other Ambulatory Visit: Payer: Self-pay

## 2021-04-16 DIAGNOSIS — R309 Painful micturition, unspecified: Secondary | ICD-10-CM | POA: Diagnosis present

## 2021-04-16 DIAGNOSIS — Z5321 Procedure and treatment not carried out due to patient leaving prior to being seen by health care provider: Secondary | ICD-10-CM | POA: Diagnosis not present

## 2021-04-16 DIAGNOSIS — R1031 Right lower quadrant pain: Secondary | ICD-10-CM | POA: Insufficient documentation

## 2021-04-16 LAB — COMPREHENSIVE METABOLIC PANEL
ALT: 23 U/L (ref 0–44)
AST: 21 U/L (ref 15–41)
Albumin: 3.5 g/dL (ref 3.5–5.0)
Alkaline Phosphatase: 55 U/L (ref 38–126)
Anion gap: 9 (ref 5–15)
BUN: 14 mg/dL (ref 6–20)
CO2: 22 mmol/L (ref 22–32)
Calcium: 9.6 mg/dL (ref 8.9–10.3)
Chloride: 108 mmol/L (ref 98–111)
Creatinine, Ser: 1.37 mg/dL — ABNORMAL HIGH (ref 0.44–1.00)
GFR, Estimated: 45 mL/min — ABNORMAL LOW (ref >60)
Glucose, Bld: 88 mg/dL (ref 70–99)
Potassium: 4.3 mmol/L (ref 3.5–5.1)
Sodium: 139 mmol/L (ref 135–145)
Total Bilirubin: 0.5 mg/dL (ref 0.3–1.2)
Total Protein: 6 g/dL — ABNORMAL LOW (ref 6.5–8.1)

## 2021-04-16 LAB — CBC WITH DIFFERENTIAL/PLATELET
Abs Immature Granulocytes: 0.02 10*3/uL (ref 0.00–0.07)
Basophils Absolute: 0.1 10*3/uL (ref 0.0–0.1)
Basophils Relative: 1 %
Eosinophils Absolute: 0.1 10*3/uL (ref 0.0–0.5)
Eosinophils Relative: 1 %
HCT: 45.3 % (ref 36.0–46.0)
Hemoglobin: 14.9 g/dL (ref 12.0–15.0)
Immature Granulocytes: 0 %
Lymphocytes Relative: 33 %
Lymphs Abs: 2.7 10*3/uL (ref 0.7–4.0)
MCH: 30 pg (ref 26.0–34.0)
MCHC: 32.9 g/dL (ref 30.0–36.0)
MCV: 91.3 fL (ref 80.0–100.0)
Monocytes Absolute: 0.5 10*3/uL (ref 0.1–1.0)
Monocytes Relative: 6 %
Neutro Abs: 4.9 10*3/uL (ref 1.7–7.7)
Neutrophils Relative %: 59 %
Platelets: 200 10*3/uL (ref 150–400)
RBC: 4.96 MIL/uL (ref 3.87–5.11)
RDW: 14.3 % (ref 11.5–15.5)
WBC: 8.2 10*3/uL (ref 4.0–10.5)
nRBC: 0 % (ref 0.0–0.2)

## 2021-04-16 LAB — URINALYSIS, ROUTINE W REFLEX MICROSCOPIC
Bilirubin Urine: NEGATIVE
Glucose, UA: NEGATIVE mg/dL
Hgb urine dipstick: NEGATIVE
Ketones, ur: NEGATIVE mg/dL
Nitrite: NEGATIVE
Protein, ur: NEGATIVE mg/dL
Specific Gravity, Urine: 1.002 — ABNORMAL LOW (ref 1.005–1.030)
pH: 7 (ref 5.0–8.0)

## 2021-04-16 NOTE — ED Notes (Signed)
Pt left emergency department  

## 2021-04-16 NOTE — ED Triage Notes (Signed)
Reported diagnose with UTI 3 weeks ago as well as bacterial vaginosis. C/o worsening symptoms of painful urination, right lower abdominal pain, flank pain, endorsed hx of cyst in the kidney

## 2021-04-16 NOTE — ED Provider Notes (Signed)
Emergency Medicine Provider Triage Evaluation Note  Dana [name] , a 60 y.o. female  was evaluated in triage.  Pt complains of continued symptoms of dysuria for the past 2 to 3 weeks.  Patient reports she was recently diagnosed with a UTI as well as bacterial vaginosis.  She states she finished her antibiotic for her Cherrelle vaginosis last week.  She cannot recall the name of the antibiotic for the UTI or states she finished it the week before last.  She states that she is now having right-sided flank pain for the past several days.  She also reports that the last 2 time she was at urgent care she had fevers.  She is currently afebrile at 98.6.  She states that she has been taking over-the-counter Azo's without relief of her pain prompting ED visit today.  She states she is nauseated however denies any vomiting.  Review of Systems  Positive: + dysuria, flank pain, nausea, fever Negative: - vomiting  Physical Exam  BP (!) 147/106   Pulse 96   Temp 98.6 F (37 C) (Oral)   Resp 17   SpO2 97%  Gen:   Awake, no distress   Resp:  Normal effort  MSK:   Moves extremities without difficulty  Other:  Right CVA TTP and right flank TTP.   Medical Decision Making  Medically screening exam initiated at 1:40 PM.  Appropriate orders placed.  Dana [name] was informed that the remainder of the evaluation will be completed by another provider, this initial triage assessment does not replace that evaluation, and the importance of remaining in the ED until their evaluation is complete.     Tanda Rockers, PA-C 04/16/21 1342    Arby Barrette, MD 04/16/21 1501

## 2021-04-22 ENCOUNTER — Ambulatory Visit: Payer: Medicare HMO | Admitting: Psychiatry

## 2021-05-19 ENCOUNTER — Other Ambulatory Visit: Payer: Self-pay

## 2021-05-19 ENCOUNTER — Emergency Department (HOSPITAL_COMMUNITY)
Admission: EM | Admit: 2021-05-19 | Discharge: 2021-05-19 | Disposition: A | Discharge status: Home / Self Care | Payer: Medicare HMO | Attending: Emergency Medicine | Admitting: Emergency Medicine

## 2021-05-19 ENCOUNTER — Emergency Department (HOSPITAL_COMMUNITY): Payer: Medicare HMO

## 2021-05-19 DIAGNOSIS — R079 Chest pain, unspecified: Secondary | ICD-10-CM | POA: Insufficient documentation

## 2021-05-19 DIAGNOSIS — Z5321 Procedure and treatment not carried out due to patient leaving prior to being seen by health care provider: Secondary | ICD-10-CM | POA: Insufficient documentation

## 2021-05-19 DIAGNOSIS — I10 Essential (primary) hypertension: Secondary | ICD-10-CM | POA: Diagnosis not present

## 2021-05-19 LAB — BASIC METABOLIC PANEL
Anion gap: 8 (ref 5–15)
BUN: 15 mg/dL (ref 6–20)
CO2: 21 mmol/L — ABNORMAL LOW (ref 22–32)
Calcium: 9.9 mg/dL (ref 8.9–10.3)
Chloride: 114 mmol/L — ABNORMAL HIGH (ref 98–111)
Creatinine, Ser: 1.39 mg/dL — ABNORMAL HIGH (ref 0.44–1.00)
GFR, Estimated: 44 mL/min — ABNORMAL LOW (ref >60)
Glucose, Bld: 88 mg/dL (ref 70–99)
Potassium: 3.9 mmol/L (ref 3.5–5.1)
Sodium: 143 mmol/L (ref 135–145)

## 2021-05-19 LAB — TROPONIN I (HIGH SENSITIVITY): Troponin I (High Sensitivity): 3 ng/L (ref <18)

## 2021-05-19 LAB — CBC
HCT: 44.9 % (ref 36.0–46.0)
Hemoglobin: 14.5 g/dL (ref 12.0–15.0)
MCH: 29.7 pg (ref 26.0–34.0)
MCHC: 32.3 g/dL (ref 30.0–36.0)
MCV: 92 fL (ref 80.0–100.0)
Platelets: 171 10*3/uL (ref 150–400)
RBC: 4.88 MIL/uL (ref 3.87–5.11)
RDW: 14.9 % (ref 11.5–15.5)
WBC: 6.8 10*3/uL (ref 4.0–10.5)
nRBC: 0 % (ref 0.0–0.2)

## 2021-05-19 LAB — I-STAT BETA HCG BLOOD, ED (MC, WL, AP ONLY): I-stat hCG, quantitative: 9.5 m[IU]/mL — ABNORMAL HIGH (ref <5)

## 2021-05-19 NOTE — ED Triage Notes (Signed)
[facility] central chest pain with radiation to back and L arm. Intermittent episodes of chest pain without radiation which have resolved. Hx hypertension but is no longer on meds because it was controlled and was taken off by MD. 324 ASA and 1 nitro given by EMS with some relief.

## 2021-05-19 NOTE — ED Notes (Signed)
Pt decide to leave emergency department

## 2021-05-31 ENCOUNTER — Encounter (HOSPITAL_BASED_OUTPATIENT_CLINIC_OR_DEPARTMENT_OTHER): Payer: Self-pay | Admitting: *Deleted

## 2021-05-31 ENCOUNTER — Emergency Department (HOSPITAL_BASED_OUTPATIENT_CLINIC_OR_DEPARTMENT_OTHER)
Admission: EM | Admit: 2021-05-31 | Discharge: 2021-05-31 | Disposition: A | Discharge status: Home / Self Care | Payer: Medicare HMO | Attending: Emergency Medicine | Admitting: Emergency Medicine

## 2021-05-31 ENCOUNTER — Other Ambulatory Visit: Payer: Self-pay

## 2021-05-31 DIAGNOSIS — R1110 Vomiting, unspecified: Secondary | ICD-10-CM | POA: Diagnosis not present

## 2021-05-31 DIAGNOSIS — Z5321 Procedure and treatment not carried out due to patient leaving prior to being seen by health care provider: Secondary | ICD-10-CM | POA: Insufficient documentation

## 2021-05-31 DIAGNOSIS — R1031 Right lower quadrant pain: Secondary | ICD-10-CM | POA: Insufficient documentation

## 2021-05-31 LAB — COMPREHENSIVE METABOLIC PANEL
ALT: 16 U/L (ref 0–44)
AST: 19 U/L (ref 15–41)
Albumin: 4 g/dL (ref 3.5–5.0)
Alkaline Phosphatase: 67 U/L (ref 38–126)
Anion gap: 10 (ref 5–15)
BUN: 21 mg/dL — ABNORMAL HIGH (ref 6–20)
CO2: 22 mmol/L (ref 22–32)
Calcium: 9.7 mg/dL (ref 8.9–10.3)
Chloride: 109 mmol/L (ref 98–111)
Creatinine, Ser: 1.62 mg/dL — ABNORMAL HIGH (ref 0.44–1.00)
GFR, Estimated: 37 mL/min — ABNORMAL LOW (ref >60)
Glucose, Bld: 135 mg/dL — ABNORMAL HIGH (ref 70–99)
Potassium: 4.2 mmol/L (ref 3.5–5.1)
Sodium: 141 mmol/L (ref 135–145)
Total Bilirubin: 0.5 mg/dL (ref 0.3–1.2)
Total Protein: 6.8 g/dL (ref 6.5–8.1)

## 2021-05-31 LAB — CBC
HCT: 45.2 % (ref 36.0–46.0)
Hemoglobin: 15.1 g/dL — ABNORMAL HIGH (ref 12.0–15.0)
MCH: 30.4 pg (ref 26.0–34.0)
MCHC: 33.4 g/dL (ref 30.0–36.0)
MCV: 90.9 fL (ref 80.0–100.0)
Platelets: 154 10*3/uL (ref 150–400)
RBC: 4.97 MIL/uL (ref 3.87–5.11)
RDW: 15.2 % (ref 11.5–15.5)
WBC: 8.4 10*3/uL (ref 4.0–10.5)
nRBC: 0 % (ref 0.0–0.2)

## 2021-05-31 LAB — URINALYSIS, ROUTINE W REFLEX MICROSCOPIC
Bilirubin Urine: NEGATIVE
Glucose, UA: NEGATIVE mg/dL
Hgb urine dipstick: NEGATIVE
Ketones, ur: NEGATIVE mg/dL
Leukocytes,Ua: NEGATIVE
Nitrite: NEGATIVE
Protein, ur: NEGATIVE mg/dL
Specific Gravity, Urine: 1.01 (ref 1.005–1.030)
pH: 7 (ref 5.0–8.0)

## 2021-05-31 LAB — LIPASE, BLOOD: Lipase: 44 U/L (ref 11–51)

## 2021-05-31 NOTE — ED Triage Notes (Signed)
Right lower quad pain for 8 weeks. Vomiting. She was seen at [facility]. She was told to come here to r/o appendicitis.

## 2021-06-08 ENCOUNTER — Other Ambulatory Visit: Payer: Self-pay

## 2021-06-08 DIAGNOSIS — R110 Nausea: Secondary | ICD-10-CM

## 2021-06-09 ENCOUNTER — Other Ambulatory Visit: Payer: Self-pay

## 2021-06-09 DIAGNOSIS — R140 Abdominal distension (gaseous): Secondary | ICD-10-CM

## 2021-06-09 DIAGNOSIS — R110 Nausea: Secondary | ICD-10-CM

## 2021-06-11 ENCOUNTER — Emergency Department (HOSPITAL_COMMUNITY)
Admission: EM | Admit: 2021-06-11 | Discharge: 2021-06-11 | Discharge status: Against Medical Advice | Payer: Medicare HMO | Attending: Emergency Medicine | Admitting: Emergency Medicine

## 2021-06-11 ENCOUNTER — Emergency Department (HOSPITAL_COMMUNITY): Payer: Medicare HMO

## 2021-06-11 ENCOUNTER — Encounter (HOSPITAL_COMMUNITY): Payer: Self-pay

## 2021-06-11 ENCOUNTER — Other Ambulatory Visit: Payer: Self-pay

## 2021-06-11 ENCOUNTER — Encounter: Payer: Self-pay | Admitting: Nephrology

## 2021-06-11 DIAGNOSIS — R1011 Right upper quadrant pain: Secondary | ICD-10-CM | POA: Insufficient documentation

## 2021-06-11 DIAGNOSIS — Z5321 Procedure and treatment not carried out due to patient leaving prior to being seen by health care provider: Secondary | ICD-10-CM | POA: Insufficient documentation

## 2021-06-11 DIAGNOSIS — R079 Chest pain, unspecified: Secondary | ICD-10-CM | POA: Insufficient documentation

## 2021-06-11 LAB — CBC WITH DIFFERENTIAL/PLATELET
Abs Immature Granulocytes: 0.01 10*3/uL (ref 0.00–0.07)
Basophils Absolute: 0.1 10*3/uL (ref 0.0–0.1)
Basophils Relative: 1 %
Eosinophils Absolute: 0 10*3/uL (ref 0.0–0.5)
Eosinophils Relative: 0 %
HCT: 46.3 % — ABNORMAL HIGH (ref 36.0–46.0)
Hemoglobin: 15.4 g/dL — ABNORMAL HIGH (ref 12.0–15.0)
Immature Granulocytes: 0 %
Lymphocytes Relative: 30 %
Lymphs Abs: 2.5 10*3/uL (ref 0.7–4.0)
MCH: 30.5 pg (ref 26.0–34.0)
MCHC: 33.3 g/dL (ref 30.0–36.0)
MCV: 91.7 fL (ref 80.0–100.0)
Monocytes Absolute: 0.5 10*3/uL (ref 0.1–1.0)
Monocytes Relative: 6 %
Neutro Abs: 5.3 10*3/uL (ref 1.7–7.7)
Neutrophils Relative %: 63 %
Platelets: 189 10*3/uL (ref 150–400)
RBC: 5.05 MIL/uL (ref 3.87–5.11)
RDW: 14.7 % (ref 11.5–15.5)
WBC: 8.4 10*3/uL (ref 4.0–10.5)
nRBC: 0 % (ref 0.0–0.2)

## 2021-06-11 LAB — URINALYSIS, ROUTINE W REFLEX MICROSCOPIC
Bilirubin Urine: NEGATIVE
Glucose, UA: NEGATIVE mg/dL
Hgb urine dipstick: NEGATIVE
Ketones, ur: NEGATIVE mg/dL
Leukocytes,Ua: NEGATIVE
Nitrite: NEGATIVE
Protein, ur: NEGATIVE mg/dL
Specific Gravity, Urine: 1.003 — ABNORMAL LOW (ref 1.005–1.030)
pH: 7 (ref 5.0–8.0)

## 2021-06-11 LAB — TROPONIN I (HIGH SENSITIVITY)
Troponin I (High Sensitivity): <2 ng/L (ref <18)
Troponin I (High Sensitivity): <2 ng/L (ref <18)

## 2021-06-11 LAB — COMPREHENSIVE METABOLIC PANEL
ALT: 16 U/L (ref 0–44)
AST: 19 U/L (ref 15–41)
Albumin: 4.2 g/dL (ref 3.5–5.0)
Alkaline Phosphatase: 68 U/L (ref 38–126)
Anion gap: 11 (ref 5–15)
BUN: 18 mg/dL (ref 6–20)
CO2: 21 mmol/L — ABNORMAL LOW (ref 22–32)
Calcium: 9.8 mg/dL (ref 8.9–10.3)
Chloride: 111 mmol/L (ref 98–111)
Creatinine, Ser: 1.57 mg/dL — ABNORMAL HIGH (ref 0.44–1.00)
GFR, Estimated: 38 mL/min — ABNORMAL LOW (ref >60)
Glucose, Bld: 96 mg/dL (ref 70–99)
Potassium: 4.6 mmol/L (ref 3.5–5.1)
Sodium: 143 mmol/L (ref 135–145)
Total Bilirubin: 0.6 mg/dL (ref 0.3–1.2)
Total Protein: 7.2 g/dL (ref 6.5–8.1)

## 2021-06-11 LAB — LIPASE, BLOOD: Lipase: 34 U/L (ref 11–51)

## 2021-06-11 MED ORDER — IOHEXOL 350 MG/ML SOLN
80.0000 mL | Freq: Once | INTRAVENOUS | Status: AC | PRN
  Administered 2021-06-11: 80 mL via INTRAVENOUS

## 2021-06-11 NOTE — Progress Notes (Signed)
Patient presented to our office this AM for routine CKD care. Has been experiencing chest pain, abdominal pain, back pain, vomiting for quite some time now. Has visited the ER quite a few times for this but left without being seen/evaluated. She was hemodynamically stable at our office with BP of 102/80, HR 89, pulse ox 95%, however temp was 99.3. She was in acute distress in the office.  -Directed her to go to the ER and recommended that she does not leave until she is fully evaluated.  Anthony Sar, MD [facility]

## 2021-06-11 NOTE — ED Triage Notes (Signed)
Patient c/o intermittent left chest, but more constant today. Pain does not radiate nor does patient c/o N/V/ Patient also c/o RUQ abdominal pain x 1 month.

## 2021-06-11 NOTE — ED Provider Triage Note (Signed)
Emergency Medicine Provider Triage Evaluation Note  Dana [name] , a 61 y.o. female  was evaluated in triage.  Pt complains of abdominal pain and chest pain.  Abdominal pain has been present for about two weeks. It is located in the epigastric region, RUQ, and RLQ. It has gradually worsened. Her doctor wanted her to come to the ED for evaluation of appendicitis. She also started having chest pain that is left sided yesterday afternoon. This has been constant and feels sharp.   Review of Systems  Positive: Abdominal pain, chest pain Negative:   Physical Exam  BP 115/81    Pulse 94    Temp 98.3 F (36.8 C) (Oral)    Resp 18    SpO2 97%  Gen:   Awake, no distress   Resp:  Normal effort  MSK:   Moves extremities without difficulty  Other:  Abdomen soft, not peritonitic.   Medical Decision Making  Medically screening exam initiated at 12:12 PM.  Appropriate orders placed.  Dana [name] was informed that the remainder of the evaluation will be completed by another provider, this initial triage assessment does not replace that evaluation, and the importance of remaining in the ED until their evaluation is complete.    Adolphus Birchwood, PA-C 06/11/21 1214

## 2021-06-15 ENCOUNTER — Emergency Department (HOSPITAL_COMMUNITY)
Admission: EM | Admit: 2021-06-15 | Discharge: 2021-06-15 | Disposition: A | Discharge status: Home / Self Care | Payer: Medicare HMO | Attending: Emergency Medicine | Admitting: Emergency Medicine

## 2021-06-15 ENCOUNTER — Encounter (HOSPITAL_COMMUNITY): Payer: Self-pay

## 2021-06-15 ENCOUNTER — Other Ambulatory Visit: Payer: Self-pay

## 2021-06-15 DIAGNOSIS — R1013 Epigastric pain: Secondary | ICD-10-CM | POA: Diagnosis not present

## 2021-06-15 DIAGNOSIS — N9489 Other specified conditions associated with female genital organs and menstrual cycle: Secondary | ICD-10-CM | POA: Insufficient documentation

## 2021-06-15 DIAGNOSIS — R112 Nausea with vomiting, unspecified: Secondary | ICD-10-CM | POA: Insufficient documentation

## 2021-06-15 DIAGNOSIS — R000 Tachycardia, unspecified: Secondary | ICD-10-CM | POA: Diagnosis not present

## 2021-06-15 DIAGNOSIS — K5900 Constipation, unspecified: Secondary | ICD-10-CM

## 2021-06-15 LAB — CBC
HCT: 46.9 % — ABNORMAL HIGH (ref 36.0–46.0)
Hemoglobin: 15.7 g/dL — ABNORMAL HIGH (ref 12.0–15.0)
MCH: 30.4 pg (ref 26.0–34.0)
MCHC: 33.5 g/dL (ref 30.0–36.0)
MCV: 90.9 fL (ref 80.0–100.0)
Platelets: 181 10*3/uL (ref 150–400)
RBC: 5.16 MIL/uL — ABNORMAL HIGH (ref 3.87–5.11)
RDW: 14.5 % (ref 11.5–15.5)
WBC: 8.3 10*3/uL (ref 4.0–10.5)
nRBC: 0 % (ref 0.0–0.2)

## 2021-06-15 LAB — COMPREHENSIVE METABOLIC PANEL
ALT: 18 U/L (ref 0–44)
AST: 17 U/L (ref 15–41)
Albumin: 4.3 g/dL (ref 3.5–5.0)
Alkaline Phosphatase: 65 U/L (ref 38–126)
Anion gap: 9 (ref 5–15)
BUN: 18 mg/dL (ref 6–20)
CO2: 21 mmol/L — ABNORMAL LOW (ref 22–32)
Calcium: 9.8 mg/dL (ref 8.9–10.3)
Chloride: 110 mmol/L (ref 98–111)
Creatinine, Ser: 1.58 mg/dL — ABNORMAL HIGH (ref 0.44–1.00)
GFR, Estimated: 38 mL/min — ABNORMAL LOW (ref >60)
Glucose, Bld: 96 mg/dL (ref 70–99)
Potassium: 4.1 mmol/L (ref 3.5–5.1)
Sodium: 140 mmol/L (ref 135–145)
Total Bilirubin: 0.5 mg/dL (ref 0.3–1.2)
Total Protein: 7.1 g/dL (ref 6.5–8.1)

## 2021-06-15 LAB — I-STAT BETA HCG BLOOD, ED (MC, WL, AP ONLY): I-stat hCG, quantitative: 8.2 m[IU]/mL — ABNORMAL HIGH (ref <5)

## 2021-06-15 LAB — LIPASE, BLOOD: Lipase: 37 U/L (ref 11–51)

## 2021-06-15 LAB — URINALYSIS, ROUTINE W REFLEX MICROSCOPIC
Bilirubin Urine: NEGATIVE
Glucose, UA: NEGATIVE mg/dL
Hgb urine dipstick: NEGATIVE
Ketones, ur: NEGATIVE mg/dL
Leukocytes,Ua: NEGATIVE
Nitrite: NEGATIVE
Protein, ur: NEGATIVE mg/dL
Specific Gravity, Urine: 1.002 — ABNORMAL LOW (ref 1.005–1.030)
pH: 7 (ref 5.0–8.0)

## 2021-06-15 MED ORDER — ONDANSETRON HCL 4 MG/2ML IJ SOLN
4.0000 mg | Freq: Once | INTRAMUSCULAR | Status: AC
Start: 2021-06-15 — End: 2021-06-15
  Administered 2021-06-15: 4 mg via INTRAVENOUS
  Filled 2021-06-15: qty 2

## 2021-06-15 MED ORDER — MORPHINE SULFATE (PF) 4 MG/ML IV SOLN
4.0000 mg | Freq: Once | INTRAVENOUS | Status: AC
  Administered 2021-06-15: 4 mg via INTRAVENOUS
  Filled 2021-06-15: qty 1

## 2021-06-15 MED ORDER — SODIUM CHLORIDE 0.9 % IV BOLUS
1000.0000 mL | Freq: Once | INTRAVENOUS | Status: AC
  Administered 2021-06-15: 1000 mL via INTRAVENOUS

## 2021-06-15 MED ORDER — PANTOPRAZOLE SODIUM 40 MG IV SOLR
40.0000 mg | Freq: Once | INTRAVENOUS | Status: AC
Start: 2021-06-15 — End: 2021-06-15
  Administered 2021-06-15: 40 mg via INTRAVENOUS
  Filled 2021-06-15: qty 40

## 2021-06-15 NOTE — ED Provider Notes (Signed)
[facility] Note   CSN: [id number] Arrival date & time: 06/15/21  1004     History  Chief Complaint  Patient presents with   Abdominal Pain   Nausea   Emesis    Dana [name] is a 61 y.o. female.  61 y.o female with a PMH of esophageal dysmotility, under the care of Dr. Trecia RogersSarah [name] resents to the ED with a chief complaint of nausea, vomiting, epigastric abdominal pain that is been ongoing since December 23.  And endorses a sensation of having something trapped down to her esophagus.  Reports every time she tries to eat she feels like "food goes down but he gets stuck in the upper part of my abdomen".  She was evaluated previously with endoscopy by Dr. Matthias HughsBuccini several years ago.  He reports the pain has worsened and she is unable to have any intake.  Most of the meals which include take sandwiches, grits and solids she vomits. She taken Zofran, Nexium, Carafate without any improvement in her symptoms.  She did call her gastroenterologist who recommended she be evaluated and admitted in the ED for an endoscopy.  She is with subjective fever, no chest pain, no shortness of breath.      The history is provided by the patient and medical records.  Abdominal Pain Pain location:  Epigastric Associated symptoms: fever, nausea and vomiting   Associated symptoms: no chest pain, no chills, no diarrhea, no shortness of breath and no sore throat   Emesis Associated symptoms: abdominal pain and fever   Associated symptoms: no chills, no diarrhea and no sore throat       Home Medications Prior to Admission medications   Medication Sig Start Date End Date Taking? Authorizing Provider  Acetaminophen (TYLENOL) 325 MG CAPS 3 capsules    [provider]  albuterol (PROVENTIL HFA;VENTOLIN HFA) 108 (90 Base) MCG/ACT inhaler Inhale into the lungs.    [provider]  ALPRAZolam Prudy Feeler(XANAX) 0.5 MG tablet Take 1 tablet (0.5 mg total) by  mouth 2 (two) times daily as needed for anxiety. 03/17/21 04/16/21  Corie Chiquitoarter, Jessica, PMHNP  Cholecalciferol (VITAMIN D) 2000 units tablet Take by mouth.    [provider]  cyclobenzaprine (FLEXERIL) 10 MG tablet Take 1 tablet (10 mg total) by mouth 2 (two) times daily as needed for muscle spasms. Patient not taking: No sig reported 12/27/19   Vivi BarrackWagoner, Matthew R, DPM  diclofenac sodium (VOLTAREN) 1 % GEL Apply 2 g topically 4 (four) times daily. Rub into affected area of foot 2 to 4 times daily 05/11/18   Vivi BarrackWagoner, Matthew R, DPM  divalproex (DEPAKOTE ER) 250 MG 24 hr tablet Take 1 tablet (250 mg total) by mouth daily. Take with a 500 mg tablet to equal total dose of 750 mg 01/20/21 02/19/21  Corie Chiquitoarter, Jessica, PMHNP  divalproex (DEPAKOTE ER) 500 MG 24 hr tablet Take 1 tablet (500 mg total) by mouth at bedtime. Take with a 250 mg tablet to equal total dose of 750 mg 01/20/21 04/20/21  Corie Chiquitoarter, Jessica, PMHNP  esomeprazole (NEXIUM) 40 MG capsule Take 40 mg by mouth daily as needed.  09/08/19   [provider]  fesoterodine (TOVIAZ) 8 MG TB24 tablet Take by mouth. Patient not taking: Reported on 01/20/2021 01/17/20   [provider]  gabapentin (NEURONTIN) 300 MG capsule Take 300 mg by mouth 3 (three) times daily.    [provider]  ondansetron (ZOFRAN) 4 MG tablet as needed. 01/30/20  [provider]  oxyCODONE (OXY IR/ROXICODONE) 5 MG immediate release tablet oxycodone 5 mg tablet    [provider]  Probiotic Product (ALIGN PO) Take by mouth.    [provider]  rosuvastatin (CRESTOR) 5 MG tablet Take 5 mg daily by mouth.    [provider]  sucralfate (CARAFATE) 1 g tablet  02/24/20   [provider]  triamcinolone acetonide (KENALOG-40) 40 MG/ML injection (RADIOLOGY ONLY) Inject into the articular space. 08/14/19   [provider]  Triamcinolone Acetonide 0.025 % LOTN triamcinolone acetonide    [provider]       Allergies    Anesthetics, amide; Other; Oxybutynin; and Codeine    Review of Systems   Review of Systems  Constitutional:  Positive for fever. Negative for chills.  HENT:  Negative for sore throat.   Respiratory:  Negative for shortness of breath.   Cardiovascular:  Negative for chest pain.  Gastrointestinal:  Positive for abdominal pain, nausea and vomiting. Negative for blood in stool and diarrhea.  Genitourinary:  Negative for flank pain.  All other systems reviewed and are negative.  Physical Exam Updated Vital Signs BP 126/83 (BP Location: Right Arm)    Pulse 96    Temp 98.1 F (36.7 C) (Oral)    Resp 19    Ht 5\' 6"  (1.676 m)    Wt 111.1 kg    SpO2 96%    BMI 39.54 kg/m  Physical Exam Vitals and nursing note reviewed.  Constitutional:      Appearance: She is well-developed.  HENT:     Head: Normocephalic and atraumatic.  Cardiovascular:     Rate and Rhythm: Tachycardia present.  Pulmonary:     Effort: Pulmonary effort is normal.     Breath sounds: No rales.  Abdominal:     General: Abdomen is flat. Bowel sounds are decreased. There is no distension.     Palpations: Abdomen is soft.     Tenderness: There is abdominal tenderness in the epigastric area. There is no right CVA tenderness or left CVA tenderness.  Skin:    General: Skin is warm and dry.  Neurological:     Mental Status: She is alert.    ED Results / Procedures / Treatments   Labs (all labs ordered are listed, but only abnormal results are displayed) Labs Reviewed  COMPREHENSIVE METABOLIC PANEL - Abnormal; Notable for the following components:      Result Value   CO2 21 (*)    Creatinine, Ser 1.58 (*)    GFR, Estimated 38 (*)    All other components within normal limits  CBC - Abnormal; Notable for the following components:   RBC 5.16 (*)    Hemoglobin 15.7 (*)    HCT 46.9 (*)    All other components within normal limits  URINALYSIS, ROUTINE W REFLEX MICROSCOPIC - Abnormal; Notable for the  following components:   Color, Urine STRAW (*)    Specific Gravity, Urine 1.002 (*)    All other components within normal limits  I-STAT BETA HCG BLOOD, ED ([facility], [facility], [facility] ONLY) - Abnormal; Notable for the following components:   I-stat hCG, quantitative 8.2 (*)    All other components within normal limits  LIPASE, BLOOD    EKG None  Radiology No results found.  Procedures Procedures    Medications Ordered in ED Medications  sodium chloride 0.9 % bolus 1,000 mL (1,000 mLs Intravenous New Bag/Given 06/15/21 1440)  ondansetron (ZOFRAN) injection 4 mg (  4 mg Intravenous Given 06/15/21 1442)  pantoprazole (PROTONIX) injection 40 mg (40 mg Intravenous Given 06/15/21 1441)  morphine 4 MG/ML injection 4 mg (4 mg Intravenous Given 06/15/21 1530)    ED Course/ Medical Decision Making/ A&P                           Medical Decision Making  Presents to the ED after 2 weeks of ongoing epigastric pain, prior history of esophageal dysmotility scoped by Dr. Matthias Hughs several years ago.  She reports worsening pain, has had multiple levels of nausea, vomiting while tempting to eat.  She reports she feels like she is very dehydrated and has not been able to keep anything down.  She states she spoke to Teena Irani from [facility] who she is followed by who recommended she be seen in the emergency department.  According to her chart review patient has had 2 previous visits to the ED however she did not stay for full work-up.  She did have a CT abdomen and pelvis 4 days ago, this did not show any acute findings.  Her lab works have been within normal limits.  During her evaluation today her vitals are within normal limits, she is afebrile, not hypoxic or tachycardic.  There is pain with palpation along the epigastric region,, pain noted along bilateral CVA however she denies any urinary symptoms.  UA on today's visit is without any nitrites or leukocytes. 's are clear to auscultation without any wheezing, rhonchi  or rales.  Interpretation of her blood work by me reveal a CMP without any electrolyte derangement, creatinine level is 1.58, this is consistent with her previous baseline.  LFTs are within normal limits.  CBC with no leukocytosis, hemoglobin is within normal limits.  No rectal bleeding.  hCG is positive, however I suspect this is likely a false positive as she does have a prior history of hysterectomy.  Lipase level was within normal limits.  She was given bolus, Zofran, morphine, tonics for symptomatic treatment.  As patient is followed by [facility] will contact them for further recommendations.   02:00 PM Spoke to [facility] who reported patient has called the office multiple times, she attempted to be seen by them but she also her pain was very severe, therefore they deferred evaluation via ED.  Patient is adamant that she needs an endoscopy, however she is dynamically stable from the ED standpoint.  3:24 PM discussed with patient my phone call with [facility], she is adamant that she needs to be seen by gastroenterology during this visit.  I will place a formal consult for gastroenterology at this time.  Patient is followed by [facility] physicians.  Patient care signed out to incoming team pending GI consultation.  3:35 PM spoke to [facility] gastroenterology Dr. Dulce Sellar will have the PA to see patient while in the ED.   Portions of this note were generated with Scientist, clinical (histocompatibility and immunogenetics). Dictation errors may occur despite best attempts at proofreading.  Final Clinical Impression(s) / ED Diagnoses Final diagnoses:  Nausea and vomiting, unspecified vomiting type  Epigastric pain    Rx / DC Orders ED Discharge Orders     None         Claude Manges, PA-C 06/15/21 1537    Jacalyn Lefevre, MD 06/17/21 [phone]

## 2021-06-15 NOTE — ED Provider Notes (Signed)
°  Care of patient assumed from PA PA Soto at 1500.  Agree with history, physical exam and plan.  See their note for further details. Briefly, 61-year-old female with history of esophageal dysmotility presents to the emergency department tingling nausea, vomiting, and epigastric abdominal pain.  Symptoms have been going on since 05/28/2021.  Physical Exam  BP 126/83 (BP Location: Right Arm)    Pulse 96    Temp 98.1 F (36.7 C) (Oral)    Resp 19    Ht 5\' 6"  (1.676 m)    Wt 111.1 kg    SpO2 96%    BMI 39.54 kg/m   Physical Exam Vitals and nursing note reviewed.  Constitutional:      General: She is not in acute distress.    Appearance: She is not ill-appearing, toxic-appearing or diaphoretic.  HENT:     Head: Normocephalic.  Eyes:     General: No scleral icterus.       Right eye: No discharge.        Left eye: No discharge.  Cardiovascular:     Rate and Rhythm: Normal rate.  Pulmonary:     Effort: Pulmonary effort is normal.  Abdominal:     General: Abdomen is flat and protuberant.     Palpations: Abdomen is soft.     Tenderness: There is no abdominal tenderness. There is no guarding or rebound.  Skin:    General: Skin is warm and dry.  Neurological:     General: No focal deficit present.     Mental Status: She is alert.  Psychiatric:        Behavior: Behavior is cooperative.    Procedures  Procedures  ED Course / MDM    Medical Decision Making  Plan for disposition after she is seen by gastroenterologist in emergency department.  On examination patient's abdomen soft, nondistended, nontender.  Patient has had no vomiting episodes since receiving antiemetics.  Patient reports that she feels better after speaking with gastroenterologist.  to gastroenterologist who reports that there is concern for possible constipation.  Advised patient to increase her MiraLAX and follow-up in the outpatient setting.  Discussed results, findings, treatment and follow up. Patient  advised of return precautions. Patient verbalized understanding and agreed with plan.       Sherron Monday, PA-C 06/15/21 1641    08/13/21, MD 06/17/21 [phone]

## 2021-06-15 NOTE — Discharge Instructions (Addendum)
You came to the emergency department today to be evaluated for your abdominal pain.  You were seen by [facility] providers who expressed concern for possible constipation.  Please increase your MiraLAX to twice daily and follow-up with [facility] in the outpatient setting.  Get help right away if: You have pain in your chest, neck, arm, or jaw. You feel extremely weak or you faint. You have persistent vomiting. You have vomit that is bright red or looks like black coffee grounds. You have bloody or black stools (feces) or stools that look like tar. You have a severe headache, a stiff neck, or both. You have severe pain, cramping, or bloating in your abdomen. You have difficulty breathing, or you are breathing very quickly. Your heart is beating very quickly. Your skin feels cold and clammy. You feel confused. You have signs of dehydration, such as: Dark urine, very little urine, or no urine. Cracked lips. Dry mouth. Sunken eyes. Sleepiness. Weakness.

## 2021-06-15 NOTE — Consult Note (Signed)
Referring Provider: [facility] Primary Care Physician:  Medicine, [facility] Primary Gastroenterologist:  [facility] (former patient of Dr. Matthias Hughs)  Reason for Consultation:  abdominal pain, nausea, and vomiting  HPI: Dana [name] is a 61 y.o. female PMH of esophageal dysmotility presents for nausea, vomiting, and epigastric abdominal pain ongoing since December 23rd.  Patient states she has had intermittent abdominal cramping.  Feels like when she eats she cannot digest everything efficiently, feels full fast.  Feels bloated..  She only has 1 bowel movement per week.  Previous history of constipation where she was taking MiraLAX daily.  Has not been taking MiraLAX daily recently.  Took MiraLAX twice yesterday and had a small BM this morning. Denies melena/hematochezia.  Reports 15 pound weight loss since December 23 due to not being able to eat.  EGD 02/2019 with Dr. Matthias Hughs: small hiatal hernia, moderate bile reflux, otherwise normal Colonoscopy 02/2019: diverticulosis and 2 small adenomatous polyps.  Barium swallow with tablet 02/2020: Dysmotility and stasis of barium, no stricture noted, tablet passed readily into the stomach.  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 Authorizing Provider  Acetaminophen (TYLENOL) 325 MG CAPS 3 capsules    [provider]  albuterol (PROVENTIL HFA;VENTOLIN HFA) 108 (90 Base) MCG/ACT inhaler Inhale into the lungs.    [provider]  ALPRAZolam Prudy Feeler) 0.5 MG tablet Take 1 tablet (0.5 mg total) by mouth 2 (two) times daily as needed for anxiety. 03/17/21 04/16/21  Corie Chiquito, PMHNP  Cholecalciferol (VITAMIN D) 2000 units tablet Take by mouth.    [provider]  cyclobenzaprine (FLEXERIL) 10 MG tablet Take 1 tablet (10 mg total) by mouth 2 (two) times daily as needed for muscle spasms. Patient not taking: No sig reported 12/27/19   Vivi Barrack, DPM  diclofenac sodium (VOLTAREN) 1 % GEL Apply 2 g topically 4 (four) times daily. Rub into affected area of foot 2 to 4 times daily 05/11/18   Vivi Barrack, DPM  divalproex (DEPAKOTE ER) 250 MG 24 hr tablet Take 1 tablet (250 mg total) by mouth daily. Take with a 500 mg tablet to equal total dose of 750 mg 01/20/21 02/19/21  Corie Chiquito, PMHNP  divalproex (DEPAKOTE ER) 500 MG 24 hr tablet Take 1 tablet (500 mg total) by mouth at bedtime. Take with a 250 mg tablet to equal total dose of 750 mg 01/20/21 04/20/21  Corie Chiquito, PMHNP  esomeprazole (NEXIUM) 40 MG capsule Take 40 mg  by mouth daily as needed.  09/08/19   [provider]  fesoterodine (TOVIAZ) 8 MG TB24 tablet Take by mouth. Patient not taking: Reported on 01/20/2021 01/17/20   [provider]  gabapentin (NEURONTIN) 300 MG capsule Take 300 mg by mouth 3 (three) times daily.    [provider]  ondansetron (ZOFRAN) 4 MG tablet as needed. 01/30/20   [provider]  oxyCODONE (OXY IR/ROXICODONE) 5 MG immediate release tablet oxycodone 5 mg tablet    [provider]  Probiotic Product (ALIGN PO) Take by mouth.    [provider]  rosuvastatin (CRESTOR) 5 MG tablet Take 5 mg daily by mouth.    [provider]  sucralfate (CARAFATE) 1 g tablet  02/24/20   [provider]  triamcinolone acetonide (KENALOG-40) 40 MG/ML injection (RADIOLOGY ONLY) Inject into the articular space. 08/14/19   [provider]  Triamcinolone Acetonide 0.025 % LOTN triamcinolone acetonide    [provider]    Scheduled Meds: Continuous Infusions: PRN Meds:.  Allergies as of 06/15/2021 - Review Complete 06/15/2021  Allergen Reaction Noted   Anesthetics, amide Nausea And Vomiting 03/02/2015   Other  07/09/2015   Oxybutynin Other (See Comments) 07/14/2014   Codeine Nausea And Vomiting and Nausea Only 04/05/2012    Family History  Problem Relation Age of Onset   Mood Disorder Mother    Colon cancer Mother    Heart disease Father    Bipolar disorder Son    Bipolar disorder Brother     Social History   Socioeconomic History   Marital status: Widowed    Spouse name: Not on file   Number of children: 2   Years of education: Not on file   Highest education level: High school graduate  Occupational History    Comment: disabled  Tobacco Use   Smoking status: Some Days    Packs/day: 1.00    Years: 34.00    Pack years: 34.00    Types: Cigarettes    Last attempt to quit: 06/07/2011    Years since quitting: 10.0   Smokeless tobacco: Never  Vaping Use   Vaping Use: Some days   Substances: Nicotine, Flavoring  Substance and Sexual Activity   Alcohol use: No   Drug use: No   Sexual activity: Not on file  Other Topics Concern   Not on file  Social History Narrative   Lives alone   Right handed   Social Determinants of Health   Financial Resource Strain: Not on file  Food Insecurity: Not on file  Transportation Needs: Not on file  Physical Activity: Not on file  Stress: Not on file  Social Connections: Not on file  Intimate Partner Violence: Not on file    Review of  Systems: Review of Systems  Constitutional:  Positive for weight loss. Negative for chills and fever.  HENT:  Negative for hearing loss and tinnitus.   Eyes:  Negative for blurred vision and double vision.  Respiratory:  Negative for cough and hemoptysis.   Cardiovascular:  Negative for chest pain and palpitations.  Gastrointestinal:  Positive for abdominal pain, constipation, nausea and vomiting. Negative for blood in stool, diarrhea, heartburn and melena.  Genitourinary:  Negative for dysuria and urgency.  Musculoskeletal:  Negative for myalgias and neck pain.  Skin:  Negative for itching and rash.  Neurological:  Negative for seizures and loss of consciousness.  Psychiatric/Behavioral:  Negative for suicidal ideas. The patient is nervous/anxious. The patient does not  have insomnia.     Physical Exam:Physical Exam Constitutional:      Appearance: She is obese.  HENT:     Head: Normocephalic and atraumatic.     Nose: Nose normal. No congestion.     Mouth/Throat:     Mouth: Mucous membranes are moist.     Pharynx: Oropharynx is clear.  Eyes:     Extraocular Movements: Extraocular movements intact.     Conjunctiva/sclera: Conjunctivae normal.  Cardiovascular:     Rate and Rhythm: Normal rate and regular rhythm.  Pulmonary:     Effort: Pulmonary effort is normal. No respiratory distress.  Abdominal:     Palpations: There is no mass.     Tenderness: There is no rebound.     Hernia: No hernia is present.     Comments: Mild distension, guarding, sluggish bowel sounds. Truncal obesity.  Musculoskeletal:        General: No swelling. Normal range of motion.  Skin:    General: Skin is warm and dry.  Neurological:     General: No focal deficit present.     Mental Status: She is alert and oriented to person, place, and time.  Psychiatric:        Mood and Affect: Mood normal.        Behavior: Behavior normal.        Thought Content: Thought content normal.        Judgment: Judgment  normal.     Comments: tearful    Vital signs: Vitals:   06/15/21 1129 06/15/21 1415  BP: 128/79 126/83  Pulse: (!) 115 96  Resp: 18 19  Temp:    SpO2: 95% 96%        GI:  Lab Results: Recent Labs    06/15/21 1040  WBC 8.3  HGB 15.7*  HCT 46.9*  PLT 181   BMET Recent Labs    06/15/21 1040  NA 140  K 4.1  CL 110  CO2 21*  GLUCOSE 96  BUN 18  CREATININE 1.58*  CALCIUM 9.8   LFT Recent Labs    06/15/21 1040  PROT 7.1  ALBUMIN 4.3  AST 17  ALT 18  ALKPHOS 65  BILITOT 0.5   PT/INR No results for input(s): LABPROT, INR in the last 72 hours.   Studies/Results: No results found.  Impression: Abdominal pain, nausea, and vomiting -CT abdomen pelvis with contrast 06/11/2021: No significant hiatal hernia, scattered colonic diverticulosis without evidence of diverticulitis, moderate colonic stool burden without evidence of obstruction -Lipase 37 -Normal renal function -Hgb 15.7  Plan: Negative CT scan and negative lab work.  Suspect patient's symptoms may be stemming from severe constipation.  Especially with CT scan showing moderate stool burden.  Recommend taking MiraLAX 1 capful, twice daily.  Increase water, increase fiber, increase exercise.  If no improvement with MiraLAX at home, will consider further work-up as an outpatient. Has appointment to follow-up with our office January 31st.  At that time can consider possible prescription strength constipation medication (Linzess) Patient can be discharged with dicyclomine/hyoscyamine as needed, informed patient of anticholinergic side effects. Feel free to call with any questions [facility] will sign off. Please contact us if we can be of any further assistance during this hospital stay.    LOS: 0 days   Legrand Como  PA-C 06/15/2021, 3:41 PM  Contact #  [phone]

## 2021-06-15 NOTE — ED Triage Notes (Signed)
Patient reports that she has been having upper abdominal pain. Patient reports that she is having upper abdominal pain x 3 weeks. Patient also reports that she has been taking zofran and feels like what she does eat sits at the top of her stomach. Patient reports that she called her doctor and her doctor wanted her to come to the ED to be admitted to the hospital.

## 2021-06-16 ENCOUNTER — Ambulatory Visit: Payer: Medicare HMO | Admitting: Psychiatry

## 2021-06-18 ENCOUNTER — Other Ambulatory Visit: Payer: Medicare HMO

## 2021-06-30 ENCOUNTER — Other Ambulatory Visit: Payer: Medicare HMO

## 2021-07-14 ENCOUNTER — Telehealth: Payer: Self-pay | Admitting: Psychiatry

## 2021-07-14 ENCOUNTER — Other Ambulatory Visit: Payer: Self-pay

## 2021-07-14 DIAGNOSIS — F319 Bipolar disorder, unspecified: Secondary | ICD-10-CM

## 2021-07-14 MED ORDER — DIVALPROEX SODIUM ER 250 MG PO TB24: 250.0000 mg | ORAL_TABLET | Freq: Every day | ORAL | 0 refills | Status: DC

## 2021-07-14 NOTE — Telephone Encounter (Signed)
Dana [name] called to check status of refill for divalproex 250 mg.  She said that pharmacy told they had sent a request on Monday and hadn't heard back.  I don't see it, but either way, she needs a refill.  Has appt 2/13.  Send to [facility] in [place], [place]

## 2021-07-14 NOTE — Telephone Encounter (Signed)
Rx sent 

## 2021-07-19 ENCOUNTER — Other Ambulatory Visit: Payer: Self-pay

## 2021-07-19 ENCOUNTER — Telehealth: Payer: Self-pay | Admitting: Psychiatry

## 2021-07-19 ENCOUNTER — Ambulatory Visit: Payer: Medicare HMO | Admitting: Psychiatry

## 2021-07-19 DIAGNOSIS — F419 Anxiety disorder, unspecified: Secondary | ICD-10-CM

## 2021-07-19 DIAGNOSIS — F319 Bipolar disorder, unspecified: Secondary | ICD-10-CM

## 2021-07-19 DIAGNOSIS — F5101 Primary insomnia: Secondary | ICD-10-CM

## 2021-07-19 MED ORDER — ALPRAZOLAM 0.5 MG PO TABS: 0.5000 mg | ORAL_TABLET | Freq: Two times a day (BID) | ORAL | 0 refills | Status: DC | PRN

## 2021-07-19 MED ORDER — DIVALPROEX SODIUM ER 250 MG PO TB24: 250.0000 mg | ORAL_TABLET | Freq: Every day | ORAL | 0 refills | Status: DC

## 2021-07-19 MED ORDER — DIVALPROEX SODIUM ER 500 MG PO TB24: 500.0000 mg | ORAL_TABLET | Freq: Every day | ORAL | 0 refills | Status: DC

## 2021-07-19 NOTE — Telephone Encounter (Signed)
Pended.

## 2021-07-19 NOTE — Telephone Encounter (Signed)
Patient would like refill for all medications. Depakote ER 250mg  Depakote ER 500mg  and Xanax 0.5mg . Ph: [phone] Appt 2/13 Pharmacy [facility] [address] [place], [place]

## 2021-07-21 ENCOUNTER — Encounter: Payer: Self-pay | Admitting: Psychiatry

## 2021-07-21 ENCOUNTER — Ambulatory Visit: Payer: Medicare HMO | Admitting: Psychiatry

## 2021-07-21 ENCOUNTER — Other Ambulatory Visit: Payer: Self-pay

## 2021-07-21 DIAGNOSIS — F419 Anxiety disorder, unspecified: Secondary | ICD-10-CM | POA: Diagnosis not present

## 2021-07-21 DIAGNOSIS — F319 Bipolar disorder, unspecified: Secondary | ICD-10-CM

## 2021-07-21 DIAGNOSIS — F5101 Primary insomnia: Secondary | ICD-10-CM | POA: Diagnosis not present

## 2021-07-21 MED ORDER — DIVALPROEX SODIUM ER 500 MG PO TB24: 500.0000 mg | ORAL_TABLET | Freq: Every day | ORAL | 0 refills | Status: DC

## 2021-07-21 MED ORDER — ALPRAZOLAM 0.5 MG PO TABS: 0.5000 mg | ORAL_TABLET | Freq: Two times a day (BID) | ORAL | 4 refills | Status: DC | PRN

## 2021-07-21 MED ORDER — DIVALPROEX SODIUM ER 250 MG PO TB24: 250.0000 mg | ORAL_TABLET | Freq: Every day | ORAL | 0 refills | Status: DC

## 2021-07-21 NOTE — Progress Notes (Signed)
Dana [name] [id number] Aug 31, 1960 61 y.o.  Subjective:   Patient ID:  Dana [name] is a 61 y.o. (DOB [birth year]) female.  Chief Complaint:  Chief Complaint  Patient presents with   Follow-up    Bipolar D/O, Anxiety    HPI Dana [name] presents to the office today for follow-up of Bipolar D/O and anxiety. She reports that she has had multiple illnesses- UTI, flu, severe constipation. She has an endoscopy scheduled for 08/03/21.    She reports that her mood has been "ok." She reports that she had some sadness around the holidays and missing her deceased husband. Has 15 lb unintentional wt loss. Denies manic s/s. She reports that she has had anxiety in response to her health and son having health issues. Denies panic attacks. Sleeping well and averaging 7-8 hours a night. Energy and motivation have been ok. She reports poor concentration. Denies SI.   Has been spending time with her sister and helping sister with medical apts. Has other friends she spends time with.   Reports improved communication and relationship with son and his family. Middle son is in hospital for severe pain. Other son is now back from overseas and is in [place].   She reports taking Xanax every night. She will also take Xanax as needed.   Past Psychiatric Medication Trials: Lithium-has taken since age 65.  Was on 600 mg twice daily for most of this duration Carbamazepine-flulike signs and symptoms, body aches, headaches, dizziness, and itching Gabapentin Depakote- Unsteadiness, falls Latuda-headaches, itching Xanax Hydroxyzine   Flowsheet Row ED from 06/15/2021 in [facility] from 06/11/2021 in [facility] from 05/31/2021 in [facility]PART[facility]  C-SSRS RISK CATEGORY No Risk No Risk No Risk        Review of Systems:  Review of Systems  HENT:  Positive for sinus pressure and sinus pain.   Musculoskeletal:   Positive for back pain. Negative for gait problem.       Knee pain  Neurological:  Positive for numbness. Negative for tremors.  Psychiatric/Behavioral:         Please refer to HPI   Medications: I have reviewed the patient's current medications.  Current Outpatient Medications  Medication Sig Dispense Refill   Acetaminophen (TYLENOL) 325 MG CAPS 3 capsules     albuterol (PROVENTIL HFA;VENTOLIN HFA) 108 (90 Base) MCG/ACT inhaler Inhale into the lungs.     Cholecalciferol (VITAMIN D) 2000 units tablet Take by mouth.     esomeprazole (NEXIUM) 40 MG capsule Take 40 mg by mouth daily as needed.      gabapentin (NEURONTIN) 300 MG capsule Take 300 mg by mouth 3 (three) times daily.     metoCLOPramide (REGLAN) 10 MG tablet TAKE 1 TABLET BY MOUTH THREE TIMES DAILY BEFORE MEALS AS NEEDED     oxyCODONE (OXY IR/ROXICODONE) 5 MG immediate release tablet oxycodone 5 mg tablet     Probiotic Product (ALIGN PO) Take by mouth.     rizatriptan (MAXALT) 10 MG tablet Take by mouth.     rosuvastatin (CRESTOR) 5 MG tablet Take 5 mg daily by mouth.     sucralfate (CARAFATE) 1 g tablet      tamsulosin (FLOMAX) 0.4 MG CAPS capsule Take 0.4 mg by mouth daily.     [START ON 08/16/2021] ALPRAZolam (XANAX) 0.5 MG tablet Take 1 tablet (0.5 mg total) by mouth 2 (two) times daily as needed for anxiety. 60 tablet 4  diclofenac sodium (VOLTAREN) 1 % GEL Apply 2 g topically 4 (four) times daily. Rub into affected area of foot 2 to 4 times daily (Patient taking differently: Apply 2 g topically as needed. Rub into affected area of foot 2 to 4 times daily) 100 g 2   divalproex (DEPAKOTE ER) 250 MG 24 hr tablet Take 1 tablet (250 mg total) by mouth daily. Take with a 500 mg tablet to equal total dose of 750 mg 30 tablet 0   divalproex (DEPAKOTE ER) 500 MG 24 hr tablet Take 1 tablet (500 mg total) by mouth at bedtime. Take with a 250 mg tablet to equal total dose of 750 mg 30 tablet 0   fesoterodine (TOVIAZ) 8 MG TB24 tablet Take  by mouth. (Patient not taking: Reported on 01/20/2021)     ondansetron (ZOFRAN) 4 MG tablet as needed.     No current facility-administered medications for this visit.    Medication Side Effects: None  Allergies:  Allergies  Allergen Reactions   Anesthetics, Amide Nausea And Vomiting   Other    Oxybutynin Other (See Comments)   Hyoscyamine    Codeine Nausea And Vomiting and Nausea Only    Past Medical History:  Diagnosis Date   Arthritis    oa   Bipolar disorder (HCC)    Chronic foot pain    Complication of anesthesia    GERD (gastroesophageal reflux disease)    Headache    migraines   Hyperlipidemia    Hypertension    Neuropathy    Palpitations last 1 month ago   PONV (postoperative nausea and vomiting)    Sleep apnea     Past Medical History, Surgical history, Social history, and Family history were reviewed and updated as appropriate.   Please see review of systems for further details on the patient's review from today.   Objective:   Physical Exam:  Wt 242 lb (109.8 kg)    BMI 39.06 kg/m   Physical Exam Constitutional:      General: She is not in acute distress. Musculoskeletal:        General: No deformity.  Neurological:     Mental Status: She is alert and oriented to person, place, and time.     Coordination: Coordination normal.  Psychiatric:        Attention and Perception: Attention and perception normal. She does not perceive auditory or visual hallucinations.        Mood and Affect: Mood normal. Mood is not anxious or depressed. Affect is not labile, blunt, angry or inappropriate.        Speech: Speech normal.        Behavior: Behavior normal.        Thought Content: Thought content normal. Thought content is not paranoid or delusional. Thought content does not include homicidal or suicidal ideation. Thought content does not include homicidal or suicidal plan.        Cognition and Memory: Cognition and memory normal.        Judgment: Judgment  normal.     Comments: Insight intact    Lab Review:     Component Value Date/Time   NA 140 06/15/2021 1040   K 4.1 06/15/2021 1040   CL 110 06/15/2021 1040   CO2 21 (L) 06/15/2021 1040   GLUCOSE 96 06/15/2021 1040   BUN 18 06/15/2021 1040   CREATININE 1.58 (H) 06/15/2021 1040   CREATININE 1.57 (H) 02/20/2019 0934   CALCIUM 9.8 06/15/2021 1040  PROT 7.1 06/15/2021 1040   ALBUMIN 4.3 06/15/2021 1040   AST 17 06/15/2021 1040   ALT 18 06/15/2021 1040   ALKPHOS 65 06/15/2021 1040   BILITOT 0.5 06/15/2021 1040   GFRNONAA 38 (L) 06/15/2021 1040   GFRAA >60 03/19/2015 0533       Component Value Date/Time   WBC 8.3 06/15/2021 1040   RBC 5.16 (H) 06/15/2021 1040   HGB 15.7 (H) 06/15/2021 1040   HCT 46.9 (H) 06/15/2021 1040   PLT 181 06/15/2021 1040   MCV 90.9 06/15/2021 1040   MCH 30.4 06/15/2021 1040   MCHC 33.5 06/15/2021 1040   RDW 14.5 06/15/2021 1040   LYMPHSABS 2.5 06/11/2021 1408   MONOABS 0.5 06/11/2021 1408   EOSABS 0.0 06/11/2021 1408   BASOSABS 0.1 06/11/2021 1408    Lithium Lvl  Date Value Ref Range Status  02/20/2019 0.8 0.6 - 1.2 mmol/L Final     No results found for: PHENYTOIN, PHENOBARB, VALPROATE, CBMZ   .res Assessment: Plan:   Pt seen for 30 minutes and time spent reviewing labs results. She reports that her mood and anxiety s/s have been well controlled and she would like to continue current medications without changes.  Continue Depakote ER 750 mg po QHS for mood s/s.  Continue Alprazolam 0.5 mg po BID prn anxiety.  Pt to follow-up in 6 months or sooner if clinically indicated.  Patient advised to contact office with any questions, adverse effects, or acute worsening in signs and symptoms.   Arshia was seen today for follow-up.  Diagnoses and all orders for this visit:  Bipolar I disorder (HCC) -     divalproex (DEPAKOTE ER) 250 MG 24 hr tablet; Take 1 tablet (250 mg total) by mouth daily. Take with a 500 mg tablet to equal total dose of 750  mg -     divalproex (DEPAKOTE ER) 500 MG 24 hr tablet; Take 1 tablet (500 mg total) by mouth at bedtime. Take with a 250 mg tablet to equal total dose of 750 mg  Anxiety disorder, unspecified type -     ALPRAZolam (XANAX) 0.5 MG tablet; Take 1 tablet (0.5 mg total) by mouth 2 (two) times daily as needed for anxiety.  Primary insomnia -     ALPRAZolam (XANAX) 0.5 MG tablet; Take 1 tablet (0.5 mg total) by mouth 2 (two) times daily as needed for anxiety.     Please see After Visit Summary for patient specific instructions.  Future Appointments  Date Time Provider Department Center  01/18/2022 10:00 AM Corie Chiquito, PMHNP [facility] None    No orders of the defined types were placed in this encounter.   -------------------------------

## 2021-08-03 ENCOUNTER — Encounter: Payer: Self-pay | Admitting: Nephrology

## 2021-08-16 ENCOUNTER — Other Ambulatory Visit: Payer: Self-pay

## 2021-08-16 DIAGNOSIS — F319 Bipolar disorder, unspecified: Secondary | ICD-10-CM

## 2021-08-16 MED ORDER — DIVALPROEX SODIUM ER 500 MG PO TB24: 500.0000 mg | ORAL_TABLET | Freq: Every day | ORAL | 5 refills | Status: DC

## 2021-08-16 MED ORDER — DIVALPROEX SODIUM ER 250 MG PO TB24: 250.0000 mg | ORAL_TABLET | Freq: Every day | ORAL | 5 refills | Status: DC

## 2021-09-08 ENCOUNTER — Other Ambulatory Visit: Payer: Self-pay

## 2021-09-08 DIAGNOSIS — F319 Bipolar disorder, unspecified: Secondary | ICD-10-CM

## 2021-09-08 DIAGNOSIS — F419 Anxiety disorder, unspecified: Secondary | ICD-10-CM

## 2021-09-08 DIAGNOSIS — F5101 Primary insomnia: Secondary | ICD-10-CM

## 2021-09-08 MED ORDER — DIVALPROEX SODIUM ER 500 MG PO TB24: 500.0000 mg | ORAL_TABLET | Freq: Every day | ORAL | 5 refills | Status: DC

## 2021-09-08 MED ORDER — ALPRAZOLAM 0.5 MG PO TABS: 0.5000 mg | ORAL_TABLET | Freq: Two times a day (BID) | ORAL | 4 refills | Status: DC | PRN

## 2021-09-08 MED ORDER — DIVALPROEX SODIUM ER 250 MG PO TB24: 250.0000 mg | ORAL_TABLET | Freq: Every day | ORAL | 5 refills | Status: DC

## 2021-10-06 ENCOUNTER — Emergency Department (HOSPITAL_COMMUNITY): Payer: Medicare HMO

## 2021-10-06 ENCOUNTER — Other Ambulatory Visit: Payer: Self-pay

## 2021-10-06 ENCOUNTER — Encounter (HOSPITAL_COMMUNITY): Payer: Self-pay

## 2021-10-06 ENCOUNTER — Emergency Department (HOSPITAL_COMMUNITY)
Admission: EM | Admit: 2021-10-06 | Discharge: 2021-10-06 | Disposition: A | Discharge status: Home / Self Care | Payer: Medicare HMO | Attending: Student | Admitting: Student

## 2021-10-06 DIAGNOSIS — R079 Chest pain, unspecified: Secondary | ICD-10-CM | POA: Diagnosis not present

## 2021-10-06 DIAGNOSIS — Z5321 Procedure and treatment not carried out due to patient leaving prior to being seen by health care provider: Secondary | ICD-10-CM | POA: Diagnosis not present

## 2021-10-06 DIAGNOSIS — R002 Palpitations: Secondary | ICD-10-CM | POA: Diagnosis not present

## 2021-10-06 DIAGNOSIS — R0602 Shortness of breath: Secondary | ICD-10-CM | POA: Diagnosis not present

## 2021-10-06 LAB — CBC
HCT: 43.5 % (ref 36.0–46.0)
Hemoglobin: 14.6 g/dL (ref 12.0–15.0)
MCH: 31.1 pg (ref 26.0–34.0)
MCHC: 33.6 g/dL (ref 30.0–36.0)
MCV: 92.6 fL (ref 80.0–100.0)
Platelets: 191 10*3/uL (ref 150–400)
RBC: 4.7 MIL/uL (ref 3.87–5.11)
RDW: 13.2 % (ref 11.5–15.5)
WBC: 8.3 10*3/uL (ref 4.0–10.5)
nRBC: 0 % (ref 0.0–0.2)

## 2021-10-06 LAB — TROPONIN I (HIGH SENSITIVITY): Troponin I (High Sensitivity): 4 ng/L (ref <18)

## 2021-10-06 LAB — BASIC METABOLIC PANEL
Anion gap: 9 (ref 5–15)
BUN: 15 mg/dL (ref 6–20)
CO2: 21 mmol/L — ABNORMAL LOW (ref 22–32)
Calcium: 10.2 mg/dL (ref 8.9–10.3)
Chloride: 113 mmol/L — ABNORMAL HIGH (ref 98–111)
Creatinine, Ser: 1.6 mg/dL — ABNORMAL HIGH (ref 0.44–1.00)
GFR, Estimated: 37 mL/min — ABNORMAL LOW (ref >60)
Glucose, Bld: 129 mg/dL — ABNORMAL HIGH (ref 70–99)
Potassium: 4.1 mmol/L (ref 3.5–5.1)
Sodium: 143 mmol/L (ref 135–145)

## 2021-10-06 LAB — I-STAT BETA HCG BLOOD, ED (MC, WL, AP ONLY): I-stat hCG, quantitative: 9.2 m[IU]/mL — ABNORMAL HIGH (ref <5)

## 2021-10-06 NOTE — ED Provider Triage Note (Signed)
Emergency Medicine Provider Triage Evaluation Note ? ?Dana [name] , a 61 y.o. female  was evaluated in triage.  Pt complains of chest pain and shortness of breath.  Says she has felt short of breath for the past couple weeks but she is having intermittent chest pain over the past few days.  Says that she was taken off her lisinopril last week because her PCP thought it was causing her to have tachycardia.  She was taken off the lisinopril and still has tachycardia.  Says that she feels the palpitations.  These improved when she takes her Xanax. ? ?Review of Systems  ?Positive: Shortness of breath and palpitations ?Negative: Leg swelling, recent travel, estrogen use, ? ?Physical Exam  ?BP 128/88   Pulse 95   Temp 98.5 ?F (36.9 ?C) (Oral)   Resp 20   Ht 5\' 6"  (1.676 m)   Wt 106.6 kg   SpO2 97%   BMI 37.93 kg/m?  ?Gen:   Awake, no distress   ?Resp:  Normal effort  ?MSK:   Moves extremities without difficulty  ?Other:  RRR, lung sounds clear. ? ?Medical Decision Making  ?Medically screening exam initiated at 11:02 AM.  Appropriate orders placed.  Dana [name] was informed that the remainder of the evaluation will be completed by another provider, this initial triage assessment does not replace that evaluation, and the importance of remaining in the ED until their evaluation is complete. ?  ?Dana Gauss, PA-C ?10/06/21 1103 ? ?

## 2021-10-06 NOTE — ED Triage Notes (Incomplete)
Pt endorses L sided CP and SOB worsening over the last 2-3 days.  ?

## 2021-10-17 ENCOUNTER — Emergency Department (HOSPITAL_COMMUNITY): Payer: Medicare HMO

## 2021-10-17 ENCOUNTER — Emergency Department (HOSPITAL_COMMUNITY)
Admission: EM | Admit: 2021-10-17 | Discharge: 2021-10-17 | Disposition: A | Discharge status: Home / Self Care | Payer: Medicare HMO | Attending: Emergency Medicine | Admitting: Emergency Medicine

## 2021-10-17 ENCOUNTER — Encounter (HOSPITAL_COMMUNITY): Payer: Self-pay

## 2021-10-17 ENCOUNTER — Other Ambulatory Visit: Payer: Self-pay

## 2021-10-17 DIAGNOSIS — R42 Dizziness and giddiness: Secondary | ICD-10-CM | POA: Diagnosis not present

## 2021-10-17 DIAGNOSIS — R0789 Other chest pain: Secondary | ICD-10-CM | POA: Diagnosis not present

## 2021-10-17 DIAGNOSIS — I10 Essential (primary) hypertension: Secondary | ICD-10-CM | POA: Diagnosis not present

## 2021-10-17 DIAGNOSIS — R079 Chest pain, unspecified: Secondary | ICD-10-CM | POA: Diagnosis present

## 2021-10-17 DIAGNOSIS — R002 Palpitations: Secondary | ICD-10-CM | POA: Diagnosis not present

## 2021-10-17 LAB — CBC
HCT: 43 % (ref 36.0–46.0)
Hemoglobin: 15.2 g/dL — ABNORMAL HIGH (ref 12.0–15.0)
MCH: 31.8 pg (ref 26.0–34.0)
MCHC: 35.3 g/dL (ref 30.0–36.0)
MCV: 90 fL (ref 80.0–100.0)
Platelets: 175 10*3/uL (ref 150–400)
RBC: 4.78 MIL/uL (ref 3.87–5.11)
RDW: 12.9 % (ref 11.5–15.5)
WBC: 6.9 10*3/uL (ref 4.0–10.5)
nRBC: 0 % (ref 0.0–0.2)

## 2021-10-17 LAB — BASIC METABOLIC PANEL
Anion gap: 7 (ref 5–15)
BUN: 18 mg/dL (ref 6–20)
CO2: 22 mmol/L (ref 22–32)
Calcium: 10.4 mg/dL — ABNORMAL HIGH (ref 8.9–10.3)
Chloride: 114 mmol/L — ABNORMAL HIGH (ref 98–111)
Creatinine, Ser: 1.48 mg/dL — ABNORMAL HIGH (ref 0.44–1.00)
GFR, Estimated: 41 mL/min — ABNORMAL LOW (ref >60)
Glucose, Bld: 110 mg/dL — ABNORMAL HIGH (ref 70–99)
Potassium: 4.1 mmol/L (ref 3.5–5.1)
Sodium: 143 mmol/L (ref 135–145)

## 2021-10-17 LAB — I-STAT BETA HCG BLOOD, ED (MC, WL, AP ONLY): I-stat hCG, quantitative: 7.4 m[IU]/mL — ABNORMAL HIGH (ref <5)

## 2021-10-17 LAB — TROPONIN I (HIGH SENSITIVITY)
Troponin I (High Sensitivity): 3 ng/L (ref <18)
Troponin I (High Sensitivity): 4 ng/L (ref <18)

## 2021-10-17 LAB — TSH: TSH: 1.719 u[IU]/mL (ref 0.350–4.500)

## 2021-10-17 MED ORDER — IOHEXOL 350 MG/ML SOLN
65.0000 mL | Freq: Once | INTRAVENOUS | Status: AC | PRN
  Administered 2021-10-17: 65 mL via INTRAVENOUS

## 2021-10-17 MED ORDER — MECLIZINE HCL 25 MG PO TABS
25.0000 mg | ORAL_TABLET | Freq: Once | ORAL | Status: AC
Start: 2021-10-17 — End: 2021-10-17
  Administered 2021-10-17: 25 mg via ORAL
  Filled 2021-10-17: qty 1

## 2021-10-17 MED ORDER — MECLIZINE HCL 25 MG PO TABS: 25.0000 mg | ORAL_TABLET | Freq: Two times a day (BID) | ORAL | 0 refills | Status: DC | PRN

## 2021-10-17 MED ORDER — METOPROLOL TARTRATE 25 MG PO TABS
25.0000 mg | ORAL_TABLET | Freq: Two times a day (BID) | ORAL | 0 refills | Status: DC | PRN
Start: 2021-10-17 — End: 2022-06-24

## 2021-10-17 NOTE — ED Triage Notes (Signed)
Pt BIB [facility] from home c/o left sided CP that radiates down her left arm x3 weeks. Pt was seen on the 12th and left AMA, pt has an appointment with her PCP for the CP on the 17th but decided to she could not wait. Pt does have a hx of anxiety. Pt took 81mg  of ASA before EMS arrival.  ? ? ?

## 2021-10-17 NOTE — ED Provider Notes (Signed)
?[facility] EMERGENCY DEPARTMENT ?Provider Note ? ? ?CSN: [id number] ?Arrival date & time: 10/17/21  1624 ? ?  ? ?History ? ?Chief Complaint  ?Patient presents with  ? Chest Pain  ? ? ?Dana [name] is a 61 y.o. female history of anxiety, hypertension here presenting with chest pain and dizziness.  Patient states that she has been having chest pain for the last 3 weeks.  She came in May 3 and left without being seen.  Patient states that she also has some intermittent palpitations as well. She saw her primary care doctor and was referred to cardiology.  She was able to see the cardiologist 2 days ago and was thought to have atypical chest pain.  She was ordered to have Holter monitor scheduled on Wednesday and also get an echo in June.  Patient states that for the last week or so, she has been having dizziness.  She states that she just does not feel like herself.  She states that she feels swimmy headed.  She denies trouble speaking or focal weakness or numbness.  Patient states that her heart rate went up to the 120s today so she came here for further evaluation. ? ?The history is provided by the patient.  ? ?  ? ?Home Medications ?Prior to Admission medications   ?Medication Sig Start Date End Date Taking? Authorizing Provider  ?Acetaminophen (TYLENOL) 325 MG CAPS 3 capsules    [provider]  ?albuterol (PROVENTIL HFA;VENTOLIN HFA) 108 (90 Base) MCG/ACT inhaler Inhale into the lungs.    [provider]  ?ALPRAZolam Prudy Feeler) 0.5 MG tablet Take 1 tablet (0.5 mg total) by mouth 2 (two) times daily as needed for anxiety. 09/08/21 10/08/21  Corie Chiquito, PMHNP  ?Cholecalciferol (VITAMIN D) 2000 units tablet Take by mouth.    [provider]  ?diclofenac sodium (VOLTAREN) 1 % GEL Apply 2 g topically 4 (four) times daily. Rub into affected area of foot 2 to 4 times daily ?Patient taking differently: Apply 2 g topically as needed. Rub into affected area of foot 2 to 4 times  daily 05/11/18   Vivi Barrack, DPM  ?divalproex (DEPAKOTE ER) 250 MG 24 hr tablet Take 1 tablet (250 mg total) by mouth daily. Take with a 500 mg tablet to equal total dose of 750 mg 09/08/21 10/08/21  Corie Chiquito, PMHNP  ?divalproex (DEPAKOTE ER) 500 MG 24 hr tablet Take 1 tablet (500 mg total) by mouth at bedtime. Take with a 250 mg tablet to equal total dose of 750 mg 09/08/21 12/07/21  Corie Chiquito, PMHNP  ?esomeprazole (NEXIUM) 40 MG capsule Take 40 mg by mouth daily as needed.  09/08/19   [provider]  ?fesoterodine (TOVIAZ) 8 MG TB24 tablet Take by mouth. ?Patient not taking: Reported on 01/20/2021 01/17/20   [provider]  ?gabapentin (NEURONTIN) 300 MG capsule Take 300 mg by mouth 3 (three) times daily.    [provider]  ?metoCLOPramide (REGLAN) 10 MG tablet TAKE 1 TABLET BY MOUTH THREE TIMES DAILY BEFORE MEALS AS NEEDED 07/02/21   [provider]  ?ondansetron (ZOFRAN) 4 MG tablet as needed. 01/30/20   [provider]  ?oxyCODONE (OXY IR/ROXICODONE) 5 MG immediate release tablet oxycodone 5 mg tablet    [provider]  ?Probiotic Product (ALIGN PO) Take by mouth.    [provider]  ?rizatriptan (MAXALT) 10 MG tablet Take by mouth. 01/20/21   [provider]  ?rosuvastatin (CRESTOR) 5 MG tablet  Take 5 mg daily by mouth.    [provider]  ?sucralfate (CARAFATE) 1 g tablet  02/24/20   [provider]  ?tamsulosin (FLOMAX) 0.4 MG CAPS capsule Take 0.4 mg by mouth daily. 07/18/21   [provider]  ?   ? ?Allergies    ?Anesthetics, amide; Other; Oxybutynin; Hyoscyamine; and Codeine   ? ?Review of Systems   ?Review of Systems  ?Cardiovascular:  Positive for chest pain and palpitations.  ?Neurological:  Positive for dizziness.  ?All other systems reviewed and are negative. ? ?Physical Exam ?Updated Vital Signs ?BP (!) 145/85 (BP Location: Right Arm)   Pulse 88   Temp 98.6 ?F (37 ?C) (Oral)   Resp 18   Ht  5\' 6"  (1.676 m)   Wt 106.6 kg   SpO2 98%   BMI 37.93 kg/m?  ?Physical Exam ?Vitals and nursing note reviewed.  ?Constitutional:   ?   Comments: Anxious  ?HENT:  ?   Head: Normocephalic.  ?Eyes:  ?   Extraocular Movements: Extraocular movements intact.  ?   Pupils: Pupils are equal, round, and reactive to light.  ?Cardiovascular:  ?   Rate and Rhythm: Normal rate and regular rhythm.  ?   Heart sounds: Normal heart sounds.  ?Pulmonary:  ?   Effort: Pulmonary effort is normal.  ?   Breath sounds: Normal breath sounds.  ?Abdominal:  ?   General: Bowel sounds are normal.  ?   Palpations: Abdomen is soft.  ?Musculoskeletal:     ?   General: Normal range of motion.  ?   Cervical back: Normal range of motion and neck supple.  ?Skin: ?   General: Skin is warm.  ?Neurological:  ?   General: No focal deficit present.  ?   Comments: Cranial nerves II to XII intact.  Patient has normal strength and sensation bilateral arms and legs.  Patient has normal gait  ?Psychiatric:     ?   Mood and Affect: Mood normal.     ?   Behavior: Behavior normal.  ? ? ?ED Results / Procedures / Treatments   ?Labs ?(all labs ordered are listed, but only abnormal results are displayed) ?Labs Reviewed  ?BASIC METABOLIC PANEL - Abnormal; Notable for the following components:  ?    Result Value  ? Chloride 114 (*)   ? Glucose, Bld 110 (*)   ? Creatinine, Ser 1.48 (*)   ? Calcium 10.4 (*)   ? GFR, Estimated 41 (*)   ? All other components within normal limits  ?CBC - Abnormal; Notable for the following components:  ? Hemoglobin 15.2 (*)   ? All other components within normal limits  ?I-STAT BETA HCG BLOOD, ED ([facility], [facility], [facility] ONLY) - Abnormal; Notable for the following components:  ? I-stat hCG, quantitative 7.4 (*)   ? All other components within normal limits  ?TSH  ?TROPONIN I (HIGH SENSITIVITY)  ?TROPONIN I (HIGH SENSITIVITY)  ? ? ?EKG ?None ? ?Radiology ?DG Chest 2 View ? ?Result Date: 10/17/2021 ?CLINICAL DATA:  Chest pain and shortness of breath.  EXAM: CHEST - 2 VIEW COMPARISON:  Oct 06, 2021 FINDINGS: The heart size and mediastinal contours are within normal limits. Both lungs are clear. The visualized skeletal structures are unremarkable. IMPRESSION: No active cardiopulmonary disease. Electronically Signed   By: Oct 08, 2021 M.D.   On: 10/17/2021 17:45   ? ?Procedures ?Procedures  ? ? ?Medications Ordered in ED ?Medications  ?meclizine (ANTIVERT) tablet 25 mg (  25 mg Oral Given 10/17/21 2027)  ?iohexol (OMNIPAQUE) 350 MG/ML injection 65 mL (65 mLs Intravenous Contrast Given 10/17/21 2114)  ? ? ?ED Course/ Medical Decision Making/ A&P ?  ?                        ?Medical Decision Making ?Dana [name] is a 61 y.o. female here with dizziness and palpitations and chest pain.  Patient has chest pain going on for several weeks now.  Patient states that she has heart rate in the 120s at home occasionally.  Her heart rate here is 88.  Patient also has dizziness for the last week or so.  I have low suspicion for ACS.  Consider PE or hyperthyroidism.  I have low suspicion for posterior circulation stroke.  Plan to get CBC and BMP and troponin and TSH and CTA chest and CT head.  Patient does not need MRI brain currently.  We will give meclizine for dizziness. ? ?10:25 PM ?I reviewed patient's labs and independently interpreted imaging. CTA chest showed no PE.  CT head unremarkable.  Troponin negative x2.  TSH is still pending but her heart rate remains in the 80s.  I think symptoms likely from peripheral vertigo.  Will discharge home with meclizine as needed for dizziness and metoprolol as needed if her heart rate persistently elevated.  Also urged that she should still get a Holter monitor and echo by cardiology ? ?Problems Addressed: ?Dizziness: acute illness or injury ?Other chest pain: acute illness or injury ?Palpitations: acute illness or injury ? ?Amount and/or Complexity of Data Reviewed ?Labs: ordered. Decision-making details documented in ED  Course. ?Radiology: ordered and independent interpretation performed. Decision-making details documented in ED Course. ?ECG/medicine tests: ordered and independent interpretation performed. Decision-making details documente

## 2021-10-17 NOTE — Discharge Instructions (Signed)
Take meclizine for dizziness. ? ?If your heart rate is greater than 120 you can take a dose of metoprolol ? ?See your doctor for follow-up. ? ?You should still get Holter monitor and echo with your cardiologist ? ?Return to ER if you have worse dizziness, palpitations, chest pain ?

## 2021-12-03 ENCOUNTER — Emergency Department (HOSPITAL_COMMUNITY)
Admission: EM | Admit: 2021-12-03 | Discharge: 2021-12-03 | Discharge status: Against Medical Advice | Payer: Medicare HMO | Source: Home / Self Care

## 2021-12-10 ENCOUNTER — Telehealth: Payer: Self-pay | Admitting: Psychiatry

## 2021-12-10 ENCOUNTER — Other Ambulatory Visit: Payer: Self-pay

## 2021-12-10 DIAGNOSIS — F319 Bipolar disorder, unspecified: Secondary | ICD-10-CM

## 2021-12-10 MED ORDER — DIVALPROEX SODIUM ER 500 MG PO TB24: 500.0000 mg | ORAL_TABLET | Freq: Every day | ORAL | 0 refills | Status: DC

## 2021-12-10 MED ORDER — DIVALPROEX SODIUM ER 250 MG PO TB24: 250.0000 mg | ORAL_TABLET | Freq: Every day | ORAL | 0 refills | Status: DC

## 2021-12-10 NOTE — Telephone Encounter (Signed)
Pt Lvm at 2:30p.  She was calling to advise [place] that she was changing her pharmacy from [facility] to [facility] on [address] in [place].  She wants a refill of the 2 Depokote (250mg  and 500mg )  and the Xanax (she knows the Xanax one is not due right now).  Next appt 8/16

## 2021-12-10 NOTE — Telephone Encounter (Signed)
Rx sent 

## 2021-12-15 ENCOUNTER — Other Ambulatory Visit: Payer: Self-pay | Admitting: Family Medicine

## 2021-12-15 DIAGNOSIS — I2699 Other pulmonary embolism without acute cor pulmonale: Secondary | ICD-10-CM

## 2021-12-21 ENCOUNTER — Ambulatory Visit
Admission: RE | Admit: 2021-12-21 | Discharge: 2021-12-21 | Disposition: A | Discharge status: Home / Self Care | Payer: Medicare HMO | Source: Ambulatory Visit | Attending: Family Medicine | Admitting: Family Medicine

## 2021-12-21 DIAGNOSIS — I2699 Other pulmonary embolism without acute cor pulmonale: Secondary | ICD-10-CM

## 2021-12-21 MED ORDER — IOPAMIDOL (ISOVUE-370) INJECTION 76%
60.0000 mL | Freq: Once | INTRAVENOUS | Status: AC | PRN
  Administered 2021-12-21: 60 mL via INTRAVENOUS

## 2022-01-12 ENCOUNTER — Other Ambulatory Visit: Payer: Medicare HMO

## 2022-01-13 ENCOUNTER — Other Ambulatory Visit: Payer: Self-pay

## 2022-01-13 ENCOUNTER — Telehealth: Payer: Self-pay | Admitting: Psychiatry

## 2022-01-13 DIAGNOSIS — F319 Bipolar disorder, unspecified: Secondary | ICD-10-CM

## 2022-01-13 MED ORDER — DIVALPROEX SODIUM ER 500 MG PO TB24: 500.0000 mg | ORAL_TABLET | Freq: Every day | ORAL | 0 refills | Status: DC

## 2022-01-13 NOTE — Telephone Encounter (Signed)
Rx sent 

## 2022-01-13 NOTE — Telephone Encounter (Signed)
Pt lvm at 12:05 pm asking for a refill on her divalproex 500 mg. Please send to the [facility] in [place]

## 2022-01-18 ENCOUNTER — Ambulatory Visit: Payer: Medicare HMO | Admitting: Psychiatry

## 2022-01-19 ENCOUNTER — Ambulatory Visit: Payer: Medicare HMO | Admitting: Psychiatry

## 2022-01-19 ENCOUNTER — Encounter: Payer: Self-pay | Admitting: Psychiatry

## 2022-01-19 DIAGNOSIS — F5101 Primary insomnia: Secondary | ICD-10-CM

## 2022-01-19 DIAGNOSIS — F419 Anxiety disorder, unspecified: Secondary | ICD-10-CM

## 2022-01-19 DIAGNOSIS — F319 Bipolar disorder, unspecified: Secondary | ICD-10-CM

## 2022-01-19 MED ORDER — ALPRAZOLAM 0.5 MG PO TABS
0.5000 mg | ORAL_TABLET | Freq: Two times a day (BID) | ORAL | 5 refills | Status: AC | PRN
Start: 2022-01-19 — End: 2024-03-28

## 2022-01-19 MED ORDER — DIVALPROEX SODIUM ER 500 MG PO TB24: 500.0000 mg | ORAL_TABLET | Freq: Every day | ORAL | 5 refills | Status: DC

## 2022-01-19 MED ORDER — DIVALPROEX SODIUM ER 250 MG PO TB24: 250.0000 mg | ORAL_TABLET | Freq: Every day | ORAL | 5 refills | Status: DC

## 2022-01-19 NOTE — Progress Notes (Signed)
Dana [name] [id number] [birth year] 62 y.o.  Subjective:   Patient ID:  Dana [name] is a 61 y.o. (DOB 08-May-1961) female.  Chief Complaint:  Chief Complaint  Patient presents with   Follow-up    Bipolar Disorder and anxiety    HPI Dana [name] presents to the office today for follow-up of Bipolar Disorder and anxiety. She reports that she has had several health issues that have caused her anxiety. She has had had cardiac issues, GI issues, blood in her urine, flank pain, and was diagnosed with a DVT on June 15th. Seeing multiple specialists. Had to cancel bladder surgery.  She reports that she has had some worry about her health. She reports that she has had some periods of depression "but I pull through." She reports that depression is short in duration. Denies any manic s/s. Denies impulsivity or risky behavior. She sleeps from 9 pm-6 am.  Energy and motivation have been low. She reports that her concentration has been diminished with increased stress. She notices some forgetfulness. She reports that she discussed concerns about her memory with her neurologist yesterday. Denies SI.   She has been seeing son and his family more after not seeing them for 3 years. She has been asked to watch grandchildren some. Sunday was 7 year anniversary of husband's death date.   Swims three days a week.  Not dating currently.   Xanax last filled 11/04/21 x 2. She is aware not to combine Xanax with oxycodone.  Past Psychiatric Medication Trials: Lithium-has taken since age 13.  Was on 600 mg twice daily for most of this duration Carbamazepine-flulike signs and symptoms, body aches, headaches, dizziness, and itching Gabapentin Depakote- Unsteadiness, falls Latuda-headaches, itching Xanax Hydroxyzine  Flowsheet Row ED from 10/17/2021 in [facility] EMERGENCY DEPARTMENT ED from 10/06/2021 in [facility] EMERGENCY DEPARTMENT ED from 06/15/2021 in [facility]ENC[facility]  C-SSRS RISK CATEGORY No Risk No Risk No Risk        Review of Systems:  Review of Systems  Gastrointestinal:  Positive for abdominal pain and nausea.  Genitourinary:  Negative for flank pain and hematuria.  Musculoskeletal:  Negative for gait problem.  Neurological:        Neuropathy  Psychiatric/Behavioral:         Please refer to HPI     She reports that she is changing PCP's to [facility]   Medications: I have reviewed the patient's current medications.  Current Outpatient Medications  Medication Sig Dispense Refill   Acetaminophen (TYLENOL) 325 MG CAPS 3 capsules     Cholecalciferol (VITAMIN D) 2000 units tablet Take by mouth.     ELIQUIS 5 MG TABS tablet Take 5 mg by mouth 2 (two) times daily.     esomeprazole (NEXIUM) 40 MG capsule Take 40 mg by mouth daily.     gabapentin (NEURONTIN) 600 MG tablet Take 600 mg by mouth at bedtime.     metoprolol succinate (TOPROL-XL) 25 MG 24 hr tablet TAKE 1 TABLET BY MOUTH 2 TIMES DAILY AS NEEDED     metoprolol tartrate (LOPRESSOR) 25 MG tablet Take 1 tablet (25 mg total) by mouth 2 (two) times daily as needed. 20 tablet 0   ondansetron (ZOFRAN) 4 MG tablet as needed.     oxyCODONE (OXY IR/ROXICODONE) 5 MG immediate release tablet Take 5 mg by mouth every 12 (twelve) hours as needed.     rizatriptan (MAXALT) 10 MG tablet Take by mouth.  rosuvastatin (CRESTOR) 5 MG tablet Take 5 mg daily by mouth.     sucralfate (CARAFATE) 1 g tablet Take 1 g by mouth with breakfast, with lunch, and with evening meal.     tamsulosin (FLOMAX) 0.4 MG CAPS capsule Take 0.4 mg by mouth daily.     ALPRAZolam (XANAX) 0.5 MG tablet Take 1 tablet (0.5 mg total) by mouth 2 (two) times daily as needed for anxiety. 60 tablet 5   divalproex (DEPAKOTE ER) 250 MG 24 hr tablet Take 1 tablet (250 mg total) by mouth daily. Take with a 500 mg tablet to equal total dose of 750 mg 30 tablet 5   divalproex (DEPAKOTE ER) 500 MG 24 hr tablet  Take 1 tablet (500 mg total) by mouth at bedtime. Take with a 250 mg tablet to equal total dose of 750 mg 30 tablet 5   lisinopril (ZESTRIL) 5 MG tablet Take 5 mg by mouth daily.     No current facility-administered medications for this visit.    Medication Side Effects: None  Allergies:  Allergies  Allergen Reactions   Anesthetics, Amide Nausea And Vomiting   Other    Oxybutynin Other (See Comments)   Hyoscyamine    Codeine Nausea And Vomiting and Nausea Only    Past Medical History:  Diagnosis Date   Arthritis    oa   Bipolar disorder (HCC)    Chronic foot pain    Complication of anesthesia    COPD (chronic obstructive pulmonary disease) (HCC)    DVT (deep venous thrombosis) (HCC)    GERD (gastroesophageal reflux disease)    Headache    migraines   Hyperlipidemia    Hypertension    Neuropathy    Palpitations last 1 month ago   PONV (postoperative nausea and vomiting)    Sleep apnea     Past Medical History, Surgical history, Social history, and Family history were reviewed and updated as appropriate.   Please see review of systems for further details on the patient's review from today.   Objective:   Physical Exam:  Wt 246 lb (111.6 kg)   BMI 39.71 kg/m   Physical Exam Constitutional:      General: She is not in acute distress. Musculoskeletal:        General: No deformity.  Neurological:     Mental Status: She is alert and oriented to person, place, and time.     Coordination: Coordination normal.  Psychiatric:        Attention and Perception: Attention and perception normal. She does not perceive auditory or visual hallucinations.        Mood and Affect: Mood normal. Mood is not anxious or depressed. Affect is not labile, blunt, angry or inappropriate.        Speech: Speech normal.        Behavior: Behavior normal.        Thought Content: Thought content normal. Thought content is not paranoid or delusional. Thought content does not include homicidal  or suicidal ideation. Thought content does not include homicidal or suicidal plan.        Cognition and Memory: Cognition and memory normal.        Judgment: Judgment normal.     Comments: Insight intact     Lab Review:     Component Value Date/Time   NA 143 10/17/2021 1657   K 4.1 10/17/2021 1657   CL 114 (H) 10/17/2021 1657   CO2 22 10/17/2021 1657   GLUCOSE  110 (H) 10/17/2021 1657   BUN 18 10/17/2021 1657   CREATININE 1.48 (H) 10/17/2021 1657   CREATININE 1.57 (H) 02/20/2019 0934   CALCIUM 10.4 (H) 10/17/2021 1657   PROT 7.1 06/15/2021 1040   ALBUMIN 4.3 06/15/2021 1040   AST 17 06/15/2021 1040   ALT 18 06/15/2021 1040   ALKPHOS 65 06/15/2021 1040   BILITOT 0.5 06/15/2021 1040   GFRNONAA 41 (L) 10/17/2021 1657   GFRAA >60 03/19/2015 0533       Component Value Date/Time   WBC 6.9 10/17/2021 1657   RBC 4.78 10/17/2021 1657   HGB 15.2 (H) 10/17/2021 1657   HCT 43.0 10/17/2021 1657   PLT 175 10/17/2021 1657   MCV 90.0 10/17/2021 1657   MCH 31.8 10/17/2021 1657   MCHC 35.3 10/17/2021 1657   RDW 12.9 10/17/2021 1657   LYMPHSABS 2.5 06/11/2021 1408   MONOABS 0.5 06/11/2021 1408   EOSABS 0.0 06/11/2021 1408   BASOSABS 0.1 06/11/2021 1408    Lithium Lvl  Date Value Ref Range Status  02/20/2019 0.8 0.6 - 1.2 mmol/L Final     No results found for: "PHENYTOIN", "PHENOBARB", "VALPROATE", "CBMZ"   .res Assessment: Plan:    Patient seen for minutes and time spent reviewing changes in medical and social history since her last visit 6 months ago.  She reports that she has had multiple significant health issues since last visit and this has been stressful for her at times.  She reports that overall her mood and anxiety symptoms have been well-controlled aside from some brief episodes of depression following significant health issues.  Reviewed lab results at time of exam and discussed that there is no evidence of adverse effects based on lab results.  She reports that she  would like to continue current medications without changes at this time. Will continue Depakote ER 750 mg at bedtime. Continue alprazolam 0.5 mg twice daily as needed for anxiety. Pt to follow-up in 6 months or sooner if clinically indicated.  Patient advised to contact office with any questions, adverse effects, or acute worsening in signs and symptoms.   Josceline was seen today for follow-up.  Diagnoses and all orders for this visit:  Bipolar I disorder (HCC) -     divalproex (DEPAKOTE ER) 250 MG 24 hr tablet; Take 1 tablet (250 mg total) by mouth daily. Take with a 500 mg tablet to equal total dose of 750 mg -     divalproex (DEPAKOTE ER) 500 MG 24 hr tablet; Take 1 tablet (500 mg total) by mouth at bedtime. Take with a 250 mg tablet to equal total dose of 750 mg  Anxiety disorder, unspecified type -     ALPRAZolam (XANAX) 0.5 MG tablet; Take 1 tablet (0.5 mg total) by mouth 2 (two) times daily as needed for anxiety.  Primary insomnia -     ALPRAZolam (XANAX) 0.5 MG tablet; Take 1 tablet (0.5 mg total) by mouth 2 (two) times daily as needed for anxiety.     Please see After Visit Summary for patient specific instructions.  Future Appointments  Date Time Provider Department Center  07/20/2022 10:00 AM Corie Chiquito, PMHNP [facility] None     No orders of the defined types were placed in this encounter.   -------------------------------

## 2022-02-16 ENCOUNTER — Other Ambulatory Visit: Payer: Self-pay | Admitting: Gastroenterology

## 2022-02-16 DIAGNOSIS — R634 Abnormal weight loss: Secondary | ICD-10-CM

## 2022-02-16 DIAGNOSIS — R110 Nausea: Secondary | ICD-10-CM

## 2022-02-16 DIAGNOSIS — R1084 Generalized abdominal pain: Secondary | ICD-10-CM

## 2022-02-18 ENCOUNTER — Ambulatory Visit
Admission: RE | Admit: 2022-02-18 | Discharge: 2022-02-18 | Disposition: A | Discharge status: Home / Self Care | Payer: Medicare HMO | Source: Ambulatory Visit | Attending: Gastroenterology | Admitting: Gastroenterology

## 2022-02-18 DIAGNOSIS — R110 Nausea: Secondary | ICD-10-CM

## 2022-02-18 DIAGNOSIS — R1084 Generalized abdominal pain: Secondary | ICD-10-CM

## 2022-02-18 DIAGNOSIS — R634 Abnormal weight loss: Secondary | ICD-10-CM

## 2022-02-18 MED ORDER — IOPAMIDOL (ISOVUE-370) INJECTION 76%
75.0000 mL | Freq: Once | INTRAVENOUS | Status: AC | PRN
  Administered 2022-02-18: 75 mL via INTRAVENOUS

## 2022-03-28 ENCOUNTER — Ambulatory Visit: Payer: Medicare HMO | Admitting: Podiatry

## 2022-04-04 ENCOUNTER — Ambulatory Visit: Payer: Medicare HMO | Admitting: Podiatry

## 2022-06-05 NOTE — Progress Notes (Deleted)
Cardiology Office Note:    Date:  06/05/2022   ID:  Dana [name], DOB [birth year], MRN [id number]  PCP:  [facility], [facility]   [facility] Providers Cardiologist:  None { Click to update primary MD,subspecialty MD or APP then REFRESH:1}    Referring MD: Medicine, [facility]*   No chief complaint on file. ***  History of Present Illness:    Dana [name] is a 61 y.o. female is seen at the request of Dr Tracie HarrierHagler for evaluation of CAD. She has a history of OSA, HLD, CKD stage 3, HTN. Has coronary and aortic calcification noted on CT. Was [facility] Nuclear stress test there in June 2023 was normal. Echo was normal. 3 day Holter showed occasional PACs, PVCs and short SVT 5 beats. She was placed on Toprol XL. 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 Treated with Eliquis.   Past Medical History:  Diagnosis Date   Arthritis    oa   Bipolar disorder (HCC)    Chronic foot pain    Complication of anesthesia    COPD (chronic obstructive pulmonary disease) (HCC)    DVT (deep venous thrombosis) (HCC)    GERD (gastroesophageal reflux disease)    Headache    migraines   Hyperlipidemia    Hypertension    Neuropathy    Palpitations last 1 month ago   PONV (postoperative nausea and vomiting)    Sleep apnea     Past Surgical History:  Procedure Laterality Date   ABDOMINAL HYSTERECTOMY  1998   complete   CESAREAN SECTION  1991   CHOLECYSTECTOMY  2005   FOOT SURGERY     left hip muscle tear surgery  2008   shoulder scope Left 2015   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     Allergies:    Anesthetics, amide; Other; Oxybutynin; Hyoscyamine; and Codeine   Social History   Socioeconomic History   Marital status: Widowed    Spouse name: Not on file   Number of children: 2   Years of education: Not on file   Highest education level: High school graduate  Occupational History    Comment: disabled  Tobacco Use   Smoking status: Former    Packs/day: 1.00    Years: 34.00    Total pack years: 34.00    Types: Cigarettes    Quit date: 06/07/2011    Years since quitting: 11.0   Smokeless tobacco: Never  Vaping Use   Vaping Use: Former   Substances: Nicotine, Flavoring  Substance and Sexual Activity   Alcohol use: No   Drug use: No   Sexual activity: Not on file  Other Topics Concern   Not on file  Social History Narrative   Lives alone   Right handed   Social Determinants of Health   Financial Resource Strain: Not on file  Food Insecurity: Not on file  Transportation Needs: Not on file  Physical Activity: Not on file  Stress: Not on file  Social Connections: Not on file     Family History: The patient's ***family history includes Bipolar disorder in her brother and  son; Colon cancer in her mother; Heart disease in her father; Mood Disorder in her mother.  ROS:   Please see the history of present illness.    *** All other systems reviewed and are negative.  EKGs/Labs/Other Studies Reviewed:    The following studies were reviewed today: NM Spect stress 11/11/21: Impression  Impression:   1. No chest pain with stress. 2. No ST changes to suggest ischemia. 3. Normal LV systolic function and wall motion. 4. No ischemia by perfusion imaging. 5. Low risk study within the limit of study.  Electronically Signed by: Fawn Kirk on 11/11/2021 5:48 PM Narrative  TECHNIQUE: Nuclear pharmacological cardiac perfusion exam:  INDICATION: chest pain, hyperlipidemia    TECHNIQUE: The patient was initially injected with 13 millicuries of technetium Cardiolite. SPECT  imaging of the left ventricle was performed at rest. Lexiscan infusion was performed as per protocol  38.3 millicuries of technetium Cardiolite were injected intravenously at peak stress. SPECT imaging of the left ventricle was then repeated. Resting ECG revealed normal sinus rhythm, low voltage. There were no ischemic ECG changes with Lexiscan infusion   This is a technically limited  study due to artifact.  FINDINGS: There is normal perfusion of the left ventricle, rest pictures have anterior and inferior defect which is not seen in stress picture, suggestive of artifact ,  TID is normal at0.83   TECHNIQUE  After the perfusion exam, gated images the left ventricle were analyzed by computer. Left ventricular ejection fraction was measured.  FINDINGS: Left ventricular ejection fraction is calculated to be 70 %. Dynamic imaging of the left ventricle demonstrates no wall motion abnormalities. Procedure Note  Badal, Alger Simons, MD - 11/11/2021 Formatting of this note might be different from the original. TECHNIQUE: Nuclear pharmacological cardiac perfusion exam:  INDICATION: chest pain, hyperlipidemia    TECHNIQUE: The patient was initially injected with 13 millicuries of technetium Cardiolite. SPECT imaging of the left ventricle was performed at rest. Lexiscan infusion was performed as per protocol  38.3 millicuries of technetium Cardiolite were injected intravenously at peak stress. SPECT imaging of the left ventricle was then repeated. Resting ECG revealed normal sinus rhythm, low voltage. There were no ischemic ECG changes with Lexiscan infusion   This is a technically limited  study due to artifact.  FINDINGS: There is normal perfusion of the left ventricle, rest pictures have anterior and inferior defect which is not seen in stress picture, suggestive of artifact ,  TID is normal at0.83   TECHNIQUE  After the perfusion exam, gated images the left ventricle were analyzed by computer.  Left ventricular ejection fraction was measured.  FINDINGS: Left ventricular ejection fraction is calculated to be 70 %. Dynamic imaging of the left ventricle demonstrates no wall motion abnormalities.    Impression:   1. No chest pain with stress. 2. No ST changes to suggest ischemia. 3. Normal LV systolic function and wall motion. 4. No ischemia by perfusion imaging. 5. Low risk study within the limit of study.  Electronically Signed by: Fawn Kirk on 11/11/2021 5:48    Ech0 11/12/21:Impression  Left Ventricle: Left ventricle size is normal.   Left Ventricle: Systolic function is normal. EF: 55-60%.   Right Ventricle: Right ventricle size is normal.   Right Ventricle: Systolic function is normal.   No major valvular abnormalities. Narrative  This result has an attachment that is not available.  Left Ventricle Left ventricle size is normal. Wall thickness is normal. Systolic function is normal. EF: 55-60%. Wall  motion is normal. Doppler parameters consistent with mild diastolic dysfunction and low to normal LA pressure.  Right Ventricle Right ventricle size is normal. Systolic function is normal.  Left Atrium Left atrium size is normal.  Right Atrium Right atrium size is normal.  IVC/SVC The inferior vena cava demonstrates a diameter of <=2.1 cm and collapses >50%; therefore, the right atrial pressure is estimated at 3 mmHg.  Mitral Valve Mitral valve structure is normal. There is no mitral regurgitation.  Tricuspid Valve Tricuspid valve structure is normal. There is trace regurgitation. Unable to assess RVSP due to incomplete Doppler signal.  Aortic Valve The aortic valve is tricuspid. The leaflets are not thickened and exhibit normal excursion. There is no regurgitation.  Pulmonic Valve The pulmonic valve was not well visualized. Trace regurgitation.  Ascending Aorta The aortic root is normal in size. The ascending aorta is normal in  size.  Pericardium There is no pericardial effusion.  Study Details A complete echo was performed using complete 2D, color flow Doppler and spectral Doppler. Overall the study quality was adequate. The imaging is on file and stored in a permanent location.   3 day Holter monitor: 11/17/21: Impression  The patient was monitored for a total of 3d, underlying rhythm is Sinus. The minimum heart rate was 41 bpm; the maximum 137 bpm; the average 69 bpm. No atrial fibrillation or flutter, no AV block or pathological pause. 2 supraventricular episodes were found. Longest SVT Episode 5 beats, Fastest SVT 147 bpm There were a total of 237 PVCs with 2 morphologies and 3 couplets. Overall PVC Burden at 0.08 % There were a total of 63 PSVCs with 1 morphologies and 6 couplets. Overall PSVC Burden at 0.02 % There is a total of 1 patient events with NSR  EKG:  EKG is *** ordered today.  The ekg ordered today demonstrates ***  Recent Labs: 06/15/2021: ALT 18 10/17/2021: BUN 18; Creatinine, Ser 1.48; Hemoglobin 15.2; Platelets 175; Potassium 4.1; Sodium 143; TSH 1.719  Recent Lipid Panel No results found for: "CHOL", "TRIG", "HDL", "CHOLHDL", "VLDL", "LDLCALC", "LDLDIRECT"  Dated 02/01/22: BUN 24, creatinine 1.78.  Dated 02/08/22: cholesterol 197, triglycerides 205, HDL 64, LDL 99. A1c 5.3%. GFR 32. Otherwise CMET and CBC normal.   Risk Assessment/Calculations:   {Does this patient have ATRIAL FIBRILLATION?[phone]}  No BP recorded.  {Refresh Note OR Click here to enter BP  :1}***         Physical Exam:    VS:  There were no vitals taken for this visit.    Wt Readings from Last 3 Encounters:  10/17/21 235 lb (106.6 kg)  10/06/21 235 lb (106.6 kg)  06/15/21 245 lb (111.1 kg)     GEN: *** Well nourished, well developed in no acute distress HEENT: Normal NECK: No JVD; No carotid bruits LYMPHATICS: No lymphadenopathy CARDIAC: ***RRR, no murmurs, rubs, gallops RESPIRATORY:  Clear to  auscultation without rales, wheezing or rhonchi  ABDOMEN: Soft, non-tender, non-distended MUSCULOSKELETAL:  No edema; No deformity  SKIN: Warm and dry NEUROLOGIC:  Alert and oriented x 3 PSYCHIATRIC:  Normal affect   ASSESSMENT:    No diagnosis found. PLAN:    In order of problems listed above:  ***      {Are you ordering a CV Procedure (e.g. stress test, cath, DCCV, TEE, etc)?   Press F2        :[id number]}    Medication Adjustments/Labs and Tests Ordered: Current medicines are reviewed at length with the patient today.  Concerns regarding medicines are outlined above.  No orders of the defined types were placed in this encounter.  No orders of the defined types were placed in this encounter.   There are no Patient Instructions on file for this visit.   Signed, Hussam Muniz [place], MD  06/05/2022 7:17 AM    [facility]

## 2022-06-09 ENCOUNTER — Ambulatory Visit: Payer: Medicare HMO | Admitting: Cardiology

## 2022-06-17 ENCOUNTER — Ambulatory Visit: Payer: Medicare HMO | Attending: Cardiology | Admitting: Cardiology

## 2022-06-17 ENCOUNTER — Encounter: Payer: Self-pay | Admitting: Cardiology

## 2022-06-17 ENCOUNTER — Other Ambulatory Visit: Payer: Self-pay | Admitting: Cardiology

## 2022-06-17 VITALS — BP 124/86 | HR 77 | Ht 66.0 in | Wt 250.4 lb

## 2022-06-17 DIAGNOSIS — E782 Mixed hyperlipidemia: Secondary | ICD-10-CM

## 2022-06-17 DIAGNOSIS — I10 Essential (primary) hypertension: Secondary | ICD-10-CM

## 2022-06-17 DIAGNOSIS — R079 Chest pain, unspecified: Secondary | ICD-10-CM

## 2022-06-17 DIAGNOSIS — R002 Palpitations: Secondary | ICD-10-CM

## 2022-06-17 MED ORDER — AMLODIPINE BESYLATE 10 MG PO TABS: 10.0000 mg | ORAL_TABLET | Freq: Every day | ORAL | 3 refills | Status: DC

## 2022-06-17 MED ORDER — ROSUVASTATIN CALCIUM 10 MG PO TABS: 10.0000 mg | ORAL_TABLET | Freq: Every day | ORAL | 3 refills | Status: DC

## 2022-06-17 NOTE — Progress Notes (Signed)
Cardiology Office Note   Date:  06/17/2022   ID:  Dana [name], DOB 1960-08-04, MRN [id number]  PCP:  Dana Beams, MD  Cardiologist:   None Referring:  Self  Chief Complaint  Patient presents with   Chest Pain      History of Present Illness: Dana [name] is a 62 y.o. female who presents for evaluation of chest discomfort and palpitations.  She had previously been seen at [facility] she wore a monitor for 3 days.  This demonstrated predominantly normal sinus rhythm.  She had a low supraventricular and ventricular ectopic beat burden.  There was very rare supraventricular tachycardia lasting only 5 beats.  She had normal heart rate variation.  An echocardiogram demonstrated well-preserved ejection fraction.  There were no valvular abnormalities.  She called today because her heart has been racing.  Her blood pressures been up as well.  She reports 150s over 90s.  This is she was advised to take 10 mg of amlodipine which he has not started yet.  She is also getting palpitations.  These happen daily.  She reports rapid rates going to the 120s.  She has not had any presyncope or syncope with this.  She says this happens at rest.  She is also getting chest discomfort.  This is an her left breast and above the breast and below the breast.  It is sharp.  It can be constant and last for days.  It is 4 out of 10.  Might hurt with a deep breath.  It is the same as when she reports having had when she had a reported negative nuclear study.  I have this report from June 2023.    Of note she has some arthritis and she swims in a swimming pool for 45 minutes and doing arthritis classes.  She enjoys this and this does not bring on any of her chest discomfort.  She thinks her breathing is okay with this.  She is not describing PND or orthopnea.  Of note we are looking for ower extremity Dopplers.  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  I was able to find this CT.  There is mention of coronary calcium and aortic atherosclerosis.  Past Medical History:  Diagnosis Date   Arthritis    oa   Bipolar disorder (HCC)    Chronic foot pain    Complication of anesthesia    COPD (chronic obstructive pulmonary disease) (HCC)    DVT (deep venous thrombosis) (HCC)    GERD (gastroesophageal reflux disease)    Headache    migraines   Hyperlipidemia    Hypertension    Neuropathy    Palpitations last 1 month ago   PONV (postoperative nausea and vomiting)    Sleep apnea     Past Surgical History:  Procedure Laterality Date   ABDOMINAL HYSTERECTOMY  1998   complete   CESAREAN SECTION  1991   CHOLECYSTECTOMY  2005   FOOT SURGERY     left hip muscle tear surgery  2008   shoulder scope Left 2015   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   Procedure: LEFT TOTAL HIP ARTHROPLASTY ANTERIOR APPROACH;  Surgeon: Ollen Gross, MD;  Location: [facility];  Service: Orthopedics;  Laterality: Left;   TUBAL LIGATION  1992     Current Outpatient Medications  Medication Sig Dispense Refill  Acetaminophen (TYLENOL) 325 MG CAPS 3 capsules     amLODipine (NORVASC) 10 MG tablet Take 1 tablet (10 mg total) by mouth daily. 90 tablet 3   Cholecalciferol (VITAMIN D) 2000 units tablet Take by mouth.     ELIQUIS 5 MG TABS tablet Take 5 mg by mouth 2 (two) times daily.     esomeprazole (NEXIUM) 40 MG capsule Take 40 mg by mouth daily.     gabapentin (NEURONTIN) 600 MG tablet Take 600 mg by mouth at bedtime.     metoprolol tartrate (LOPRESSOR) 25 MG tablet Take 1 tablet (25 mg total) by mouth 2 (two) times daily as needed. 20 tablet 0   ondansetron (ZOFRAN) 4 MG tablet as needed.     oxyCODONE (OXY IR/ROXICODONE) 5 MG immediate release tablet Take 5 mg by mouth every 12 (twelve) hours as needed.     rizatriptan (MAXALT) 10 MG tablet Take by mouth.     tamsulosin (FLOMAX) 0.4 MG CAPS capsule Take  0.4 mg by mouth daily.     ALPRAZolam (XANAX) 0.5 MG tablet Take 1 tablet (0.5 mg total) by mouth 2 (two) times daily as needed for anxiety. 60 tablet 5   divalproex (DEPAKOTE ER) 250 MG 24 hr tablet Take 1 tablet (250 mg total) by mouth daily. Take with a 500 mg tablet to equal total dose of 750 mg 30 tablet 5   divalproex (DEPAKOTE ER) 500 MG 24 hr tablet Take 1 tablet (500 mg total) by mouth at bedtime. Take with a 250 mg tablet to equal total dose of 750 mg 30 tablet 5   rosuvastatin (CRESTOR) 10 MG tablet Take 1 tablet (10 mg total) by mouth daily. 90 tablet 3   sucralfate (CARAFATE) 1 g tablet Take 1 g by mouth with breakfast, with lunch, and with evening meal.     No current facility-administered medications for this visit.    Allergies:   Anesthetics, amide; Other; Oxybutynin; Hyoscyamine; and Codeine    Social History:  The patient  reports that she quit smoking about 11 years ago. Her smoking use included cigarettes. She has a 34.00 pack-year smoking history. She uses smokeless tobacco. She reports that she does not drink alcohol and does not use drugs.   Family History:  The patient's family history includes Bipolar disorder in her brother and son; Colon cancer in her mother; Mood Disorder in her mother; Pulmonary fibrosis in her father.    ROS:  Please see the history of present illness.   Otherwise, review of systems are positive for none.   All other systems are reviewed and negative.    PHYSICAL EXAM: VS:  BP 124/86   Pulse 77   Ht 5\' 6"  (1.676 m)   Wt 250 lb 6.4 oz (113.6 kg)   SpO2 95%   BMI 40.42 kg/m  , BMI Body mass index is 40.42 kg/m. GENERAL:  Well appearing HEENT:  Pupils equal round and reactive, fundi not visualized, oral mucosa unremarkable NECK:  No jugular venous distention, waveform within normal limits, carotid upstroke brisk and symmetric, no bruits, no thyromegaly LYMPHATICS:  No cervical, inguinal adenopathy LUNGS:  Clear to auscultation  bilaterally BACK:  No CVA tenderness CHEST:  Unremarkable HEART:  PMI not displaced or sustained,S1 and S2 within normal limits, no S3, no S4, no clicks, no rubs, no murmurs ABD:  Flat, positive bowel sounds normal in frequency in pitch, no bruits, no rebound, no guarding, no midline pulsatile mass, no hepatomegaly, no splenomegaly EXT:  2 plus pulses throughout, no edema, no cyanosis no clubbing SKIN:  No rashes no nodules NEURO:  Cranial nerves II through XII grossly intact, motor grossly intact throughoutno PSYCH:  Cognitively intact, oriented to person place and time    EKG:  EKG is ordered today. The ekg ordered today demonstrates sinus rhythm, rate 77, axis within normal limits, intervals within normal limits, no acute ST-T wave changes.   Recent Labs: 10/17/2021: BUN 18; Creatinine, Ser 1.48; Hemoglobin 15.2; Platelets 175; Potassium 4.1; Sodium 143; TSH 1.719    Lipid Panel No results found for: "CHOL", "TRIG", "HDL", "CHOLHDL", "VLDL", "LDLCALC", "LDLDIRECT"    Wt Readings from Last 3 Encounters:  06/17/22 250 lb 6.4 oz (113.6 kg)  10/17/21 235 lb (106.6 kg)  10/06/21 235 lb (106.6 kg)      Other studies Reviewed: Additional studies/ records that were reviewed today include: Extensive review of [facility] records. Review of the above records demonstrates:  Please see elsewhere in the note.     ASSESSMENT AND PLAN:  Chest pain:  Her chest discomfort has atypical greater than typical features.  She had a negative perfusion study earlier this year.  She does not have significant uncontrolled risk factors.  At this point the pretest probability of obstructive coronary disease is low and I do not think that contrast imaging with her renal insufficiency is warranted.  We had a long discussion about this.  I would suggest she follow-up with her primary provider for nonanginal etiologies.  Palpitations: I am going to have her wear a 4-week monitor.  I do see recent electrolytes  that were normal.  She had thyroid earlier this year that was normal.  Further management will be based on these results.  Hypertension: She is going to go ahead and increase her Norvasc as was suggested.  CKD IIIb: She follows with nephrology.  As above I will try to avoid contrast given the atypical nature of her symptoms.  DVT: We are trying to get data to verify her history of recurrent DVTs.  Continues on anticoagulation.  Dyslipidemia: I would like her LDL to be 50 is possible.  It was 99.  She agrees to try Crestor 10 mg   Current medicines are reviewed at length with the patient today.  The patient does not have concerns regarding medicines.  The following changes have been made:  no change  Labs/ tests ordered today include: No  Orders Placed This Encounter  Procedures   Lipid panel   EKG 12-Lead     Disposition:   FU with me in 6 weeks.      Signed, Minus Breeding, MD  06/17/2022 3:18 PM    [place]

## 2022-06-17 NOTE — Patient Instructions (Addendum)
Medication Instructions:  INCREASE CRESTOR TO 10MG  DAILY INCREASE AMLODIPINE TO 10MG  DAILY *If you need a refill on your cardiac medications before your next appointment, please call your pharmacy*   Lab Work: Your physician recommends that you return for lab work in: [place], [place]  If you have labs (blood work) drawn today and your tests are completely normal, you will receive your results only by: [place] (if you have MyChart) OR A paper copy in the mail If you have any lab test that is abnormal or we need to change your treatment, we will call you to review the results.   Testing/Procedures: Your physician has recommended that you wear an event monitor. Event monitors are medical devices that record the heart's electrical activity. Doctors most often [place] these monitors to diagnose arrhythmias. Arrhythmias are problems with the speed or rhythm of the heartbeat. The monitor is a small, portable device. You can wear one while you do your normal daily activities. This is usually used to diagnose what is causing palpitations/syncope (passing out). You will wear this monitor for 4 weeks.    Follow-Up: At [facility], you and your health needs are our priority.  As part of our continuing mission to provide you with exceptional heart care, we have created designated Provider Care Teams.  These Care Teams include your primary Cardiologist (physician) and Advanced Practice Providers (APPs -  Physician Assistants and Nurse Practitioners) who all work together to provide you with the care you need, when you need it.  We recommend signing up for the patient portal called "MyChart".  Sign up information is provided on this After Visit Summary.  MyChart is used to connect with patients for Virtual Visits (Telemedicine).  Patients are able to view lab/test results, encounter notes, upcoming appointments, etc.  Non-urgent messages can be sent to your provider as well.   To  learn more about what you can do with MyChart, go to [email or web address].    Your next appointment:   6 week(s)  Provider:   Dr Percival Spanish    Other Instructions

## 2022-06-23 ENCOUNTER — Emergency Department (HOSPITAL_BASED_OUTPATIENT_CLINIC_OR_DEPARTMENT_OTHER): Payer: Medicare HMO | Admitting: Radiology

## 2022-06-23 ENCOUNTER — Encounter (HOSPITAL_BASED_OUTPATIENT_CLINIC_OR_DEPARTMENT_OTHER): Payer: Self-pay

## 2022-06-23 ENCOUNTER — Telehealth: Payer: Self-pay | Admitting: Cardiology

## 2022-06-23 ENCOUNTER — Other Ambulatory Visit: Payer: Self-pay

## 2022-06-23 ENCOUNTER — Emergency Department (HOSPITAL_BASED_OUTPATIENT_CLINIC_OR_DEPARTMENT_OTHER)
Admission: EM | Admit: 2022-06-23 | Discharge: 2022-06-23 | Disposition: A | Discharge status: Home / Self Care | Payer: Medicare HMO | Attending: Emergency Medicine | Admitting: Emergency Medicine

## 2022-06-23 DIAGNOSIS — I10 Essential (primary) hypertension: Secondary | ICD-10-CM | POA: Insufficient documentation

## 2022-06-23 DIAGNOSIS — R0789 Other chest pain: Secondary | ICD-10-CM | POA: Diagnosis not present

## 2022-06-23 DIAGNOSIS — Z7901 Long term (current) use of anticoagulants: Secondary | ICD-10-CM | POA: Insufficient documentation

## 2022-06-23 DIAGNOSIS — R079 Chest pain, unspecified: Secondary | ICD-10-CM

## 2022-06-23 DIAGNOSIS — J449 Chronic obstructive pulmonary disease, unspecified: Secondary | ICD-10-CM | POA: Insufficient documentation

## 2022-06-23 DIAGNOSIS — Z79899 Other long term (current) drug therapy: Secondary | ICD-10-CM | POA: Insufficient documentation

## 2022-06-23 DIAGNOSIS — R002 Palpitations: Secondary | ICD-10-CM

## 2022-06-23 LAB — BASIC METABOLIC PANEL
Anion gap: 11 (ref 5–15)
BUN: 18 mg/dL (ref 6–20)
CO2: 22 mmol/L (ref 22–32)
Calcium: 10.3 mg/dL (ref 8.9–10.3)
Chloride: 108 mmol/L (ref 98–111)
Creatinine, Ser: 1.83 mg/dL — ABNORMAL HIGH (ref 0.44–1.00)
GFR, Estimated: 31 mL/min — ABNORMAL LOW (ref >60)
Glucose, Bld: 103 mg/dL — ABNORMAL HIGH (ref 70–99)
Potassium: 4.2 mmol/L (ref 3.5–5.1)
Sodium: 141 mmol/L (ref 135–145)

## 2022-06-23 LAB — CBC
HCT: 43 % (ref 36.0–46.0)
Hemoglobin: 15 g/dL (ref 12.0–15.0)
MCH: 31.6 pg (ref 26.0–34.0)
MCHC: 34.9 g/dL (ref 30.0–36.0)
MCV: 90.7 fL (ref 80.0–100.0)
Platelets: 199 10*3/uL (ref 150–400)
RBC: 4.74 MIL/uL (ref 3.87–5.11)
RDW: 13 % (ref 11.5–15.5)
WBC: 6.6 10*3/uL (ref 4.0–10.5)
nRBC: 0 % (ref 0.0–0.2)

## 2022-06-23 LAB — TROPONIN I (HIGH SENSITIVITY)
Troponin I (High Sensitivity): <2 ng/L (ref <18)
Troponin I (High Sensitivity): <2 ng/L (ref <18)

## 2022-06-23 MED ORDER — METOPROLOL TARTRATE 25 MG PO TABS
25.0000 mg | ORAL_TABLET | Freq: Once | ORAL | Status: AC
  Administered 2022-06-23: 25 mg via ORAL
  Filled 2022-06-23: qty 1

## 2022-06-23 NOTE — ED Provider Notes (Signed)
[facility] EMERGENCY DEPT Provider Note   CSN: [id number] Arrival date & time: 06/23/22  1228     History Chief Complaint  Patient presents with   Chest Pain    Dana [name] is a 62 y.o. female with history of bipolar disorder, COPD, DVT, GERD, hyperlipidemia, and hypertension who presents to the emergency department today with palpitations that been ongoing for several weeks.  Patient was seen evaluated by her cardiologist on 06/17/2022 for similar symptoms.  Per chart review, patient had been seen previously at [facility] and she wore a monitor for 3 days.  She predominantly had a normal sinus rhythm with some rare PVCs.  At the time of that visit she was having chest pain which she describes as a sharp sensation.  The plan was for her to wear a 4-week monitor.  Today, patient describes palpitations that feel like a fluttering sensation.  She also feels anxious.  She denies shortness of breath, fever, chills, cough, congestion.  No leg pain or syncope.  She states that her chest pain is improved with metoprolol which she takes as needed.   Chest Pain      Home Medications Prior to Admission medications   Medication Sig Start Date End Date Taking? Authorizing Provider  Acetaminophen (TYLENOL) 325 MG CAPS 3 capsules    [provider]  ALPRAZolam (XANAX) 0.5 MG tablet Take 1 tablet (0.5 mg total) by mouth 2 (two) times daily as needed for anxiety. 01/19/22 02/18/22  Corie Chiquito, PMHNP  amLODipine (NORVASC) 10 MG tablet Take 1 tablet (10 mg total) by mouth daily. 06/17/22 06/12/23  [place], Peter M, MD  Cholecalciferol (VITAMIN D) 2000 units tablet Take by mouth.    [provider]  divalproex (DEPAKOTE ER) 250 MG 24 hr tablet Take 1 tablet (250 mg total) by mouth daily. Take with a 500 mg tablet to equal total dose of 750 mg 01/19/22 02/18/22  Corie Chiquito, PMHNP  divalproex (DEPAKOTE ER) 500 MG 24 hr tablet Take 1 tablet (500 mg total) by mouth at  bedtime. Take with a 250 mg tablet to equal total dose of 750 mg 01/19/22 04/19/22  Corie Chiquito, PMHNP  ELIQUIS 5 MG TABS tablet Take 5 mg by mouth 2 (two) times daily. 01/13/22   [provider]  esomeprazole (NEXIUM) 40 MG capsule Take 40 mg by mouth daily. 09/08/19   [provider]  gabapentin (NEURONTIN) 300 MG capsule Take 300 mg by mouth 3 (three) times daily. 03/30/22   [provider]  gabapentin (NEURONTIN) 600 MG tablet Take 600 mg by mouth at bedtime.    [provider]  metoprolol tartrate (LOPRESSOR) 25 MG tablet Take 1 tablet (25 mg total) by mouth 2 (two) times daily as needed. 10/17/21   Charlynne Pander, MD  ondansetron (ZOFRAN) 4 MG tablet as needed. 01/30/20   [provider]  oxyCODONE (OXY IR/ROXICODONE) 5 MG immediate release tablet Take 5 mg by mouth every 12 (twelve) hours as needed.    [provider]  rizatriptan (MAXALT) 10 MG tablet Take by mouth. 01/20/21   [provider]  rosuvastatin (CRESTOR) 10 MG tablet Take 1 tablet (10 mg total) by mouth daily. 06/17/22 06/12/23  [place], Peter M, MD  sodium bicarbonate 650 MG tablet Take 650 mg by mouth 2 (two) times daily. 05/09/22   [provider]  sucralfate (CARAFATE) 1 g tablet Take 1 g by mouth with breakfast, with lunch, and with evening meal. 02/24/20  [provider]  tamsulosin (FLOMAX) 0.4 MG CAPS capsule Take 0.4 mg by mouth daily. 07/18/21   [provider]      Allergies    Anesthetics, amide; Other; Oxybutynin; Hyoscyamine; and Codeine    Review of Systems   Review of Systems  Cardiovascular:  Positive for chest pain.  All other systems reviewed and are negative.   Physical Exam Updated Vital Signs BP (!) 130/91   Pulse 70   Temp 99.1 F (37.3 C) (Oral)   Resp (!) 25   Ht 5\' 6"  (1.676 m)   Wt 113.6 kg   SpO2 98%   BMI 40.42 kg/m  Physical Exam Vitals and nursing note reviewed.  Constitutional:      General:  She is not in acute distress.    Appearance: Normal appearance.  HENT:     Head: Normocephalic and atraumatic.  Eyes:     General:        Right eye: No discharge.        Left eye: No discharge.  Cardiovascular:     Rate and Rhythm: Normal rate and regular rhythm. Extrasystoles are present.    Heart sounds: Normal heart sounds.  Pulmonary:     Comments: Clear to auscultation bilaterally.  Normal effort.  No respiratory distress.  No evidence of wheezes, rales, or rhonchi heard throughout. Abdominal:     General: Abdomen is flat. Bowel sounds are normal. There is no distension.     Tenderness: There is no abdominal tenderness. There is no guarding or rebound.  Musculoskeletal:        General: Normal range of motion.     Cervical back: Neck supple.  Skin:    General: Skin is warm and dry.     Findings: No rash.  Neurological:     General: No focal deficit present.     Mental Status: She is alert.  Psychiatric:        Mood and Affect: Mood normal.        Behavior: Behavior normal.     ED Results / Procedures / Treatments   Labs (all labs ordered are listed, but only abnormal results are displayed) Labs Reviewed  BASIC METABOLIC PANEL - Abnormal; Notable for the following components:      Result Value   Glucose, Bld 103 (*)    Creatinine, Ser 1.83 (*)    GFR, Estimated 31 (*)    All other components within normal limits  CBC  TROPONIN I (HIGH SENSITIVITY)  TROPONIN I (HIGH SENSITIVITY)    EKG EKG Interpretation  Date/Time:  Thursday June 23 2022 12:36:54 EST Ventricular Rate:  81 PR Interval:  180 QRS Duration: 76 QT Interval:  340 QTC Calculation: 394 R Axis:   18 Text Interpretation: Normal sinus rhythm Anterior infarct (cited on or before 17-Oct-2021) Abnormal ECG When compared with ECG of 17-Oct-2021 16:29, Questionable change in initial forces of Anterior leads Confirmed by Fredia Sorrow [phone]) on 06/23/2022 2:11:18 PM  Radiology DG Chest 2  View  Result Date: 06/23/2022 CLINICAL DATA:  Chest pain for 1 week. EXAM: CHEST - 2 VIEW COMPARISON:  CTA chest 12/21/2021, chest radiograph 10/17/2021 FINDINGS: The cardiomediastinal silhouette is normal allowing for slight rightward patient rotation. The lungs are clear, with no focal consolidation or pulmonary edema. There is no pleural effusion or pneumothorax There is no acute osseous abnormality. IMPRESSION: No radiographic evidence of acute cardiopulmonary process. Electronically Signed   By: Valetta Mole M.D.   On: 06/23/2022  13:21    Procedures Procedures    Medications Ordered in ED Medications  metoprolol tartrate (LOPRESSOR) tablet 25 mg (25 mg Oral Given 06/23/22 1520)    ED Course/ Medical Decision Making/ A&P Clinical Course as of 06/23/22 1624  Thu Jun 23, 2022  1510 CBC Normal. [CF]  5329 Basic metabolic panel(!) Elevated creatinine which is slightly above the patient's baseline.  GFR slightly below patient's baseline [CF]  1510 DG Chest 2 View I personally ordered and interpreted this study and do not see any evidence of cardiopulmonary process.  I do agree with the radiologist interpretation. [CF]  1545 Troponin I (High Sensitivity) Initial and delta troponin are normal.  [CF]  1617 On reevaluation, her palpitations have improved after metoprolol.  I am not seeing as many PVCs on the monitor while in the room. [CF]    Clinical Course User Index [CF] Hendricks Limes, PA-C   {   Click here for ABCD2, HEART and other calculators          HEART Score: 3                Medical Decision Making EZINNE YOGI is a 62 y.o. female patient who presents to the emergency department today for further evaluation of chest discomfort.  While in the room, noticed on the monitor that the patient was having somewhat frequent PVCs.  These lasted anywhere between 1 and 3 beats.  This did increase her chest discomfort during these episodes.  They did resolve spontaneously.   As  highlighted in ED course, patient is feeling better.  I have a low suspicion for cardiac ischemia at this time as her troponins were negative x 2. Heart score of 3.  I will have her follow-up with her cardiologist and have her plan to continue with the heart monitor.  I was planning on putting her on metoprolol daily but considering that she is about to have the heart monitor placed for 4 weeks of feeling this was skew the results.  I will hold off for now and leave that up to her cardiologist.  Patient is in agreement.  Strict return precautions were discussed.  She is safe for discharge.  Amount and/or Complexity of Data Reviewed Labs: ordered. Decision-making details documented in ED Course. Radiology: ordered. Decision-making details documented in ED Course.  Risk Prescription drug management.    Final Clinical Impression(s) / ED Diagnoses Final diagnoses:  Palpitations  Chest pain, unspecified type    Rx / DC Orders ED Discharge Orders     None         Hendricks Limes, [place] 06/23/22 1624    Fredia Sorrow, MD 06/24/22 1529

## 2022-06-23 NOTE — ED Notes (Signed)
Discharge instructions and follow up care reviewed and explained, pt verbalized understanding and had no further questions on d/c. Pt caox4, ambulatory, and in no obvious distress on d/c.  

## 2022-06-23 NOTE — Discharge Instructions (Signed)
Please call your cardiologist to get recommendations on whether or not to take metoprolol on a daily basis with your heart monitor coming up.  Please return to the emergency room if any worsening symptoms you might have.

## 2022-06-23 NOTE — Telephone Encounter (Signed)
Pt c/o of Chest Pain: STAT if CP now or developed within 24 hours  1. Are you having CP right now? Yes  2. Are you experiencing any other symptoms (ex. SOB, nausea, vomiting, sweating)? No  3. How long have you been experiencing CP? 2 days  4. Is your CP continuous or coming and going? Come and go  5. Have you taken Nitroglycerin? No   Pt c/o BP issue: STAT if pt c/o blurred vision, one-sided weakness or slurred speech  1. What are your last 5 BP readings? No  2. Are you having any other symptoms (ex. Dizziness, headache, blurred vision, passed out)? Dizziness  3. What is your BP issue? T states BP has been elevated since medication change  Patient c/o Palpitations:  High priority if patient c/o lightheadedness, shortness of breath, or chest pain  How long have you had palpitations/irregular HR/ Afib? Are you having the symptoms now? Yes  Are you currently experiencing lightheadedness, SOB or CP?  Yes  Do you have a history of afib (atrial fibrillation) or irregular heart rhythm? Yes  Have you checked your BP or HR? (document readings if available): No  Are you experiencing any other symptoms? No   ?Call transferred

## 2022-06-23 NOTE — Telephone Encounter (Signed)
Spoke to patient .  She states she saw Dr Percival Spanish last week. Chest pain this past week more frequent. She states  it is a sharpe pain, left side of chest  . It occurs if she walks or sits . She states if she taking in a deep breathe.  She states she has used some Metoprolol  for palpitations this week - she staes she rececived medication when she went to Er this summer.  RN informed patient she will need to be evaluated at the nearest ER. Recommend if she is having active pain to call 911.  Patient states she will go  but go by private vehicle. She states she called office to let us know her symptoms since she was Dr Konrad Dolores patient  Patient verbalized understanding.

## 2022-06-23 NOTE — ED Triage Notes (Signed)
Patient here POV from Home.  Endorses CP that began approximately 1 Week. Intermittent Episodes of Tachycardia anjd some noted SOB. Sharp and Dull; mostly Left-Sided.   Seen by Cardiologist Friday. Possible Atrial Fib.   NAD Noted during Triage. A&Ox4. GCS 15. Ambulatory.

## 2022-06-24 ENCOUNTER — Telehealth: Payer: Self-pay | Admitting: Cardiology

## 2022-06-24 MED ORDER — METOPROLOL TARTRATE 25 MG PO TABS: 25.0000 mg | ORAL_TABLET | Freq: Two times a day (BID) | ORAL | 2 refills | Status: DC | PRN

## 2022-06-24 NOTE — Addendum Note (Signed)
Addended by: Beatrix Fetters on: 06/24/2022 11:47 AM   Modules accepted: Orders

## 2022-06-24 NOTE — Telephone Encounter (Signed)
Spoke with pt regarding her visit to ED yesterday. Pt called the office yesterday with chest pain and palpitations. Pt was told my ED provider to check with Dr. Percival Spanish in regards to her taking PRN lopressor for increased heart rate and palpitations. ED provider told her that this could interfere with heart monitor results and she should consult her cardiologist to advise on this. Pt did receive her monitor this morning and is planning to place it today. Will forward to Dr. Percival Spanish. Pt verbalizes understanding.

## 2022-06-24 NOTE — Telephone Encounter (Signed)
Patient called stating she called yesterday for chest pain and was advised to got to the emergency room.  She went to the emergency room and was told she should be put on a beta blocker but they were going to leave that up to Dr. Percival Spanish to prescribe.  She states that Dr. Percival Spanish ordered a monitor for her to wear (she hasn't received it yet), she was advised that the beta blocker would interfere with the readings of the monitor. She would like for Dr. Percival Spanish to review the notes from the emergency room and advise what he would like to do.

## 2022-06-24 NOTE — Telephone Encounter (Signed)
Minus Breeding, MD  You    Ok to use the beta blocker and wear the monitor.     Spoke with pt, advised her of Dr. Rosezella [place] recommendations. Pt asks for a refill on her lopressor. Additional refills sent to pt's preferred pharmacy. Pt verbalizes understanding.

## 2022-06-25 ENCOUNTER — Telehealth: Payer: Self-pay | Admitting: Physician Assistant

## 2022-06-25 NOTE — Telephone Encounter (Signed)
Pt set up for monitor for eval of palpitations. She is not sure how to apply it. It just arrived in the mail today. She would like to get help to apply it. She can wait until Monday to apply it and I will see if someone can call her to explain how. Richardson Dopp, PA-C    06/25/2022 12:29 PM

## 2022-06-27 ENCOUNTER — Ambulatory Visit: Payer: Medicare HMO | Attending: Cardiology

## 2022-06-27 ENCOUNTER — Telehealth: Payer: Self-pay | Admitting: Physician Assistant

## 2022-06-27 DIAGNOSIS — R002 Palpitations: Secondary | ICD-10-CM

## 2022-06-27 DIAGNOSIS — R079 Chest pain, unspecified: Secondary | ICD-10-CM

## 2022-06-27 DIAGNOSIS — E782 Mixed hyperlipidemia: Secondary | ICD-10-CM

## 2022-06-27 DIAGNOSIS — I10 Essential (primary) hypertension: Secondary | ICD-10-CM | POA: Diagnosis not present

## 2022-06-27 NOTE — Telephone Encounter (Signed)
Pt called in needing help putting her heart monitor on

## 2022-06-27 NOTE — Telephone Encounter (Signed)
Went through monitor set up and applications over the phone. Patient successfully applied and started monitor.

## 2022-07-01 ENCOUNTER — Ambulatory Visit: Payer: Medicare HMO | Admitting: Cardiology

## 2022-07-14 ENCOUNTER — Telehealth: Payer: Self-pay | Admitting: Cardiology

## 2022-07-14 NOTE — Telephone Encounter (Signed)
Patient reports that the symptoms she has been experiencing is NOT NEW. She says that these s/s have been ongoing- palpitations, chest pain, nausea- on and off. She says that she wears a monitor and alerts the monitor when she feels palpitations. She has a prn prescription for metoprolol 25mg  BID and after taking this, her chest pain is much better; she has not taken it yet. Told pt that she needs to go ahead and take it. She also reports that she was put on Lasix last Wednesday by her Kidney provider- due to her swelling- ankles were very swollen. She reports that she weighed 248lb yesterday, took her Lasix and this morning she weighs 243lb. She is just concerned about gaining 5 lbs- but is glad that the Lasix helped. I asked if she was put on any fluid restrictions, if she is reducing her sodium intake. They told her to just drink less? She reports that she does drink a lot of water, like 150oz a day- her Bipolar meds make her thirsty. I told her that she needs to decrease her fluid intake. I asked if her labs were checked when she went to the Kidney doctor and if she is taking any potassium. She said that they did not check labs, just urine and she is not on potassium. She has been trying to get in touch with the kidney Dr to discuss her s/s.   She reports no shortness of breath, no pain in back/jaw/arm; all her s/s are ones she has been experiencing over the last few weeks- which she was seen by Dr Percival Spanish for. I told her to take her Metoprolol and if she does not get relief from her symptoms like she normally does and/or if she develops new symptoms- increased pain, SOB, sweating, dizziness, then she needs to go to the ER.    Told her to keep a log of her BP's.   She will try to get back in touch with kidney provider too. I told her that I would send this information over to her provider for recommendations. Possible fluid restrictions, sodium restrictions, etc....  Pt verbalized understanding.

## 2022-07-14 NOTE — Telephone Encounter (Signed)
Pt c/o of Chest Pain: STAT if CP now or developed within 24 hours  1. Are you having CP right now?   Yes  2. Are you experiencing any other symptoms (ex. SOB, nausea, vomiting, sweating)?   Nausea   3. How long have you been experiencing CP? Since last visit with Dr. Percival Spanish (1/12)  4. Is your CP continuous or coming and going?  Coming and going  5. Have you taken Nitroglycerin?   Does not have ?  Patient has not taken beta-blocker this morning as yet.  Patient is concerned she is also having swelling in her feet.

## 2022-07-15 NOTE — Telephone Encounter (Signed)
Left message for pt dr hochrein's comments.

## 2022-07-20 ENCOUNTER — Ambulatory Visit: Payer: Medicare HMO | Admitting: Psychiatry

## 2022-07-21 NOTE — Progress Notes (Signed)
Cardiology Office Note   Date:  07/22/2022   ID:  Dana [name], DOB 10/16/1960, MRN [id number]  PCP:  Dana Macadam, MD  Cardiologist:   None Referring:  Self   Chief Complaint  Patient presents with   Chest Pain     History of Present Illness: Dana [name] is a 62 y.o. female who presents for evaluation of chest discomfort and palpitations.  She had previously been seen at [name] she wore a monitor for 3 days.  This demonstrated predominantly normal sinus rhythm.  She had a low supraventricular and ventricular ectopic beat burden.  There was very rare supraventricular tachycardia lasting only 5 beats.  She had normal heart rate variation.  An echocardiogram demonstrated well-preserved ejection fraction.  There were no valvular abnormalities.  Since I last saw her she was in the ED with palpitations and chest pain.  I reviewed these records for this visit.   She had chest pain without any evidence for ischemia.  She had palpitations.  She does have coronary calcium and aortic atherosclerosis noted on a previous CT. she presents with continued palpitations and continuous pain in her heart.  She has worn a monitor and I was able to retrieve some results and she had a sinus rhythm with rare PVCs.  Symptoms do not seem to correlate with arrhythmia.  She recently had some increased lower extremity swelling had Lasix added but says her creatinine is up a little bit.  She is having this managed by her nephrologist.  Past Medical History:  Diagnosis Date   Arthritis    oa   Bipolar disorder ([place])    Chronic foot pain    Complication of anesthesia    COPD (chronic obstructive pulmonary disease) (HCC)    DVT (deep venous thrombosis) (HCC)    GERD (gastroesophageal reflux disease)    Headache    migraines   Hyperlipidemia    Hypertension    Neuropathy    Palpitations last 1 month ago   PONV (postoperative nausea and vomiting)    Sleep apnea     Past Surgical History:   Procedure Laterality Date   ABDOMINAL HYSTERECTOMY  1998   complete   CESAREAN SECTION  1991   CHOLECYSTECTOMY  2005   FOOT SURGERY     left hip muscle tear surgery  2008   shoulder scope Left 2015   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   Procedure: LEFT TOTAL HIP ARTHROPLASTY ANTERIOR APPROACH;  Surgeon: Gaynelle Arabian, MD;  Location: [facility];  Service: Orthopedics;  Laterality: Left;   TUBAL LIGATION  1992     Current Outpatient Medications  Medication Sig Dispense Refill   Acetaminophen (TYLENOL) 325 MG CAPS 3 capsules     amLODipine (NORVASC) 5 MG tablet Take 5 mg by mouth daily.     Cholecalciferol (VITAMIN D) 2000 units tablet Take by mouth.     ELIQUIS 5 MG TABS tablet Take 5 mg by mouth 2 (two) times daily.     esomeprazole (NEXIUM) 40 MG capsule Take 40 mg by mouth daily.     furosemide (LASIX) 20 MG tablet Take 20 mg by mouth daily.     gabapentin (NEURONTIN) 300 MG capsule Take 300 mg by mouth 3 (three) times daily.     gabapentin (NEURONTIN) 600 MG tablet Take 600 mg by mouth at bedtime.     ondansetron (ZOFRAN) 4 MG tablet as needed.     oxyCODONE (OXY IR/ROXICODONE) 5 MG immediate release tablet  Take 5 mg by mouth every 12 (twelve) hours as needed.     rizatriptan (MAXALT) 10 MG tablet Take by mouth.     rosuvastatin (CRESTOR) 10 MG tablet Take 1 tablet (10 mg total) by mouth daily. 90 tablet 3   sucralfate (CARAFATE) 1 g tablet Take 1 g by mouth with breakfast, with lunch, and with evening meal.     tamsulosin (FLOMAX) 0.4 MG CAPS capsule Take 0.4 mg by mouth daily.     ALPRAZolam (XANAX) 0.5 MG tablet Take 1 tablet (0.5 mg total) by mouth 2 (two) times daily as needed for anxiety. 60 tablet 5   divalproex (DEPAKOTE ER) 250 MG 24 hr tablet Take 1 tablet (250 mg total) by mouth daily. Take with a 500 mg tablet to equal total dose of 750 mg 30 tablet 5   divalproex (DEPAKOTE ER) 500 MG 24 hr tablet Take 1 tablet (500 mg total) by mouth at bedtime. Take with a 250 mg  tablet to equal total dose of 750 mg 30 tablet 5   metoprolol tartrate (LOPRESSOR) 25 MG tablet Take 1 tablet (25 mg total) by mouth 2 (two) times daily. 180 tablet 3   No current facility-administered medications for this visit.    Allergies:   Anesthetics, amide; Other; Oxybutynin; Hyoscyamine; and Codeine   ROS:  Please see the history of present illness.   Otherwise, review of systems are positive for none.   All other systems are reviewed and negative.    PHYSICAL EXAM: VS:  BP 120/77   Pulse 80   Ht 5' 6"$  (1.676 m)   Wt 249 lb (112.9 kg)   SpO2 97%   BMI 40.19 kg/m  , BMI Body mass index is 40.19 kg/m. GENERAL:  Well appearing NECK:  No jugular venous distention, waveform within normal limits, carotid upstroke brisk and symmetric, no bruits, no thyromegaly LUNGS:  Clear to auscultation bilaterally CHEST:  Unremarkable HEART:  PMI not displaced or sustained,S1 and S2 within normal limits, no S3, no S4, no clicks, no rubs, no murmurs ABD:  Flat, positive bowel sounds normal in frequency in pitch, no bruits, no rebound, no guarding, no midline pulsatile mass, no hepatomegaly, no splenomegaly EXT:  2 plus pulses throughout, no edema, no cyanosis no clubbing  EKG:  EKG is  not ordered today.   Recent Labs: 10/17/2021: TSH 1.719 06/23/2022: BUN 18; Creatinine, Ser 1.83; Hemoglobin 15.0; Platelets 199; Potassium 4.2; Sodium 141    Lipid Panel No results found for: "CHOL", "TRIG", "HDL", "CHOLHDL", "VLDL", "LDLCALC", "LDLDIRECT"    Wt Readings from Last 3 Encounters:  07/22/22 249 lb (112.9 kg)  06/23/22 250 lb 7.1 oz (113.6 kg)  06/17/22 250 lb 6.4 oz (113.6 kg)      Other studies Reviewed: Additional studies/ records that were reviewed today include: Monitor and ED records Review of the above records demonstrates:  Please see elsewhere in the note.     ASSESSMENT AND PLAN:  Chest pain:  Her chest discomfort has atypical greater than typical features and is likely  not anginal particularly in the face of the negative perfusion test earlier this year.  She be high risk for contrasted study.  We had this discussion and I think we need to look for continued alternative etiologies to her chest discomfort.  She needs continued risk reduction given the noted coronary calcium.   Palpitations: So far there were no sustained arrhythmias I will follow-up with the final results.   Hypertension: Her blood  pressure is controlled.  Because of her palpitations however I am going to increase her metoprolol to a scheduled 25 mg twice daily instead of as needed.   CKD IIIb: She follows with nephrology.     DVT:   She is on chronic anticoagulation.    Dyslipidemia: I would like her LDL to be 50 if possible.  I increased her Crestor last time and we will check a lipid profile in April   Current medicines are reviewed at length with the patient today.  The patient does not have concerns regarding medicines.  The following changes have been made:  None  Labs/ tests ordered today include: None  Orders Placed This Encounter  Procedures   Lipid panel   Ambulatory referral to Pulmonology     Disposition:   FU with me in April.   Signed, Minus Breeding, MD  07/22/2022 9:10 AM    [place]

## 2022-07-22 ENCOUNTER — Encounter: Payer: Self-pay | Admitting: Cardiology

## 2022-07-22 ENCOUNTER — Ambulatory Visit: Payer: Medicare HMO | Attending: Cardiology | Admitting: Cardiology

## 2022-07-22 VITALS — BP 120/77 | HR 80 | Ht 66.0 in | Wt 249.0 lb

## 2022-07-22 DIAGNOSIS — E782 Mixed hyperlipidemia: Secondary | ICD-10-CM | POA: Diagnosis not present

## 2022-07-22 DIAGNOSIS — R002 Palpitations: Secondary | ICD-10-CM

## 2022-07-22 DIAGNOSIS — R072 Precordial pain: Secondary | ICD-10-CM | POA: Diagnosis not present

## 2022-07-22 DIAGNOSIS — I10 Essential (primary) hypertension: Secondary | ICD-10-CM

## 2022-07-22 DIAGNOSIS — J439 Emphysema, unspecified: Secondary | ICD-10-CM

## 2022-07-22 MED ORDER — METOPROLOL TARTRATE 25 MG PO TABS: 25.0000 mg | ORAL_TABLET | Freq: Two times a day (BID) | ORAL | 3 refills | Status: DC

## 2022-07-22 NOTE — Patient Instructions (Signed)
Medication Instructions:   TAKE METOPROLOL 25 MG TWICE DAILY  *If you need a refill on your cardiac medications before your next appointment, please call your pharmacy*   Lab Work:  Your physician recommends that you return for lab work [id number]  If you have labs (blood work) drawn today and your tests are completely normal, you will receive your results only by: [place] (if you have MyChart) OR A paper copy in the mail If you have any lab test that is abnormal or we need to change your treatment, we will call you to review the results.   Follow-Up: At [facility], you and your health needs are our priority.  As part of our continuing mission to provide you with exceptional heart care, we have created designated Provider Care Teams.  These Care Teams include your primary Cardiologist (physician) and Advanced Practice Providers (APPs -  Physician Assistants and Nurse Practitioners) who all work together to provide you with the care you need, when you need it.  We recommend signing up for the patient portal called "MyChart".  Sign up information is provided on this After Visit Summary.  MyChart is used to connect with patients for Virtual Visits (Telemedicine).  Patients are able to view lab/test results, encounter notes, upcoming appointments, etc.  Non-urgent messages can be sent to your provider as well.   To learn more about what you can do with MyChart, go to [email or web address].    Your next appointment:   2 month(s)  Provider:   Minus Breeding MD

## 2022-07-22 NOTE — Telephone Encounter (Signed)
Late entry..duplicate messages. See telephone note from 1/22

## 2022-07-26 ENCOUNTER — Encounter: Payer: Self-pay | Admitting: Psychiatry

## 2022-07-26 ENCOUNTER — Ambulatory Visit: Payer: Medicare HMO | Admitting: Psychiatry

## 2022-07-26 DIAGNOSIS — F319 Bipolar disorder, unspecified: Secondary | ICD-10-CM

## 2022-07-26 DIAGNOSIS — Z79899 Other long term (current) drug therapy: Secondary | ICD-10-CM

## 2022-07-26 MED ORDER — DIVALPROEX SODIUM ER 500 MG PO TB24: 500.0000 mg | ORAL_TABLET | Freq: Every day | ORAL | 5 refills | Status: DC

## 2022-07-26 MED ORDER — DIVALPROEX SODIUM ER 250 MG PO TB24: 250.0000 mg | ORAL_TABLET | Freq: Every day | ORAL | 5 refills | Status: DC

## 2022-07-26 NOTE — Progress Notes (Unsigned)
Dana [name] [id number] 06/28/1960 62 y.o.  Subjective:   Patient ID:  Dana [name] is a 62 y.o. (DOB 1960-08-29) female.  Chief Complaint:  Chief Complaint  Patient presents with   Follow-up    Bipolar Disorder, Anxiety, and insomnia    HPI Dana [name] presents to the office today for follow-up of Bipolar Disorder, Anxiety, and insomnia.   She reports that she had cramp in right calf and was found 2 DVT's. She has continued to take Eliquis and has been referred to hematology. She started having chest pains and palpitations. She saw cardiology and then went to ER for palpitations. She has worn a heart monitor. She reports worsening kidney function and has seen Nephrology recently. She reports that she is now in Renal Failure Stage 3b. She reports feeling more tired since starting diuretic.   She reports that one of her closest friends that she grew up with died over the weekend. She reports that this has been a difficult loss for her. She reports that friend would call  And check in her after her husband died. She was able to spend time with him the day before he died.   She reports that her mood has been stable. "With God on my side, I didn't get depressed." She reports that she rarely has needed Xanax prn and has not had significant anxiety. She denies manic symptoms or any recent excessive spending. She reports adequate sleep and goes to bed early and gets up early. She reports that her energy is lower at times due to medical issues. She reports that her energy is better after swimming at the [facility]. Appetite has been low. Concentration is fair. Denies SI.   She reports "my relationship with my oldest son and his family have really gotten great." She has been included in family birthday parties, holiday celebrations, and grandchildren's activities.   Alprazolam last filled 01/19/22.  Past Psychiatric Medication Trials: Lithium-has taken since age 44.  Was on 600 mg twice daily  for most of this duration Carbamazepine-flulike signs and symptoms, body aches, headaches, dizziness, and itching Gabapentin Depakote- Unsteadiness, falls Latuda-headaches, itching Xanax Hydroxyzine  Flowsheet Row ED from 06/23/2022 in [facility] Emergency Department at [facility] ED from 10/17/2021 in [facility] Emergency Department at [facility] ED from 10/06/2021 in [facility] Emergency Department at [place] No Risk No Risk No Risk        Review of Systems:  Review of Systems  Gastrointestinal: Negative.   Musculoskeletal:  Negative for gait problem.  Neurological:  Negative for tremors.  Psychiatric/Behavioral:         Please refer to HPI    Medications: I have reviewed the patient's current medications.  Current Outpatient Medications  Medication Sig Dispense Refill   metoprolol tartrate (LOPRESSOR) 25 MG tablet Take 1 tablet (25 mg total) by mouth 2 (two) times daily. 180 tablet 3   Acetaminophen (TYLENOL) 325 MG CAPS 3 capsules     ALPRAZolam (XANAX) 0.5 MG tablet Take 1 tablet (0.5 mg total) by mouth 2 (two) times daily as needed for anxiety. 60 tablet 5   amLODipine (NORVASC) 5 MG tablet Take 5 mg by mouth daily.     Cholecalciferol (VITAMIN D) 2000 units tablet Take by mouth.     divalproex (DEPAKOTE ER) 250 MG 24 hr tablet Take 1 tablet (250 mg total) by mouth daily. Take with a 500 mg tablet to equal total dose  of 750 mg 30 tablet 5   divalproex (DEPAKOTE ER) 500 MG 24 hr tablet Take 1 tablet (500 mg total) by mouth at bedtime. Take with a 250 mg tablet to equal total dose of 750 mg 30 tablet 5   ELIQUIS 5 MG TABS tablet Take 5 mg by mouth 2 (two) times daily.     esomeprazole (NEXIUM) 40 MG capsule Take 40 mg by mouth daily.     furosemide (LASIX) 20 MG tablet Take 20 mg by mouth daily.     gabapentin (NEURONTIN) 300 MG capsule Take 300 mg by mouth 3 (three) times daily.     gabapentin (NEURONTIN) 600 MG tablet Take  600 mg by mouth at bedtime.     ondansetron (ZOFRAN) 4 MG tablet as needed.     oxyCODONE (OXY IR/ROXICODONE) 5 MG immediate release tablet Take 5 mg by mouth every 12 (twelve) hours as needed.     rizatriptan (MAXALT) 10 MG tablet Take by mouth.     rosuvastatin (CRESTOR) 10 MG tablet Take 1 tablet (10 mg total) by mouth daily. 90 tablet 3   sucralfate (CARAFATE) 1 g tablet Take 1 g by mouth with breakfast, with lunch, and with evening meal.     tamsulosin (FLOMAX) 0.4 MG CAPS capsule Take 0.4 mg by mouth daily.     No current facility-administered medications for this visit.    Medication Side Effects: None  Allergies:  Allergies  Allergen Reactions   Anesthetics, Amide Nausea And Vomiting   Other    Oxybutynin Other (See Comments)   Hyoscyamine    Codeine Nausea And Vomiting and Nausea Only    Past Medical History:  Diagnosis Date   Arthritis    oa   Bipolar disorder (HCC)    Chronic foot pain    Complication of anesthesia    COPD (chronic obstructive pulmonary disease) (HCC)    DVT (deep venous thrombosis) (HCC)    GERD (gastroesophageal reflux disease)    Headache    migraines   Hyperlipidemia    Hypertension    Neuropathy    Palpitations last 1 month ago   PONV (postoperative nausea and vomiting)    Sleep apnea     Past Medical History, Surgical history, Social history, and Family history were reviewed and updated as appropriate.   Please see review of systems for further details on the patient's review from today.   Objective:   Physical Exam:  There were no vitals taken for this visit.  Physical Exam  Lab Review:     Component Value Date/Time   NA 141 06/23/2022 1239   K 4.2 06/23/2022 1239   CL 108 06/23/2022 1239   CO2 22 06/23/2022 1239   GLUCOSE 103 (H) 06/23/2022 1239   BUN 18 06/23/2022 1239   CREATININE 1.83 (H) 06/23/2022 1239   CREATININE 1.57 (H) 02/20/2019 0934   CALCIUM 10.3 06/23/2022 1239   PROT 7.1 06/15/2021 1040   ALBUMIN 4.3  06/15/2021 1040   AST 17 06/15/2021 1040   ALT 18 06/15/2021 1040   ALKPHOS 65 06/15/2021 1040   BILITOT 0.5 06/15/2021 1040   GFRNONAA 31 (L) 06/23/2022 1239   GFRAA >60 03/19/2015 0533       Component Value Date/Time   WBC 6.6 06/23/2022 1239   RBC 4.74 06/23/2022 1239   HGB 15.0 06/23/2022 1239   HCT 43.0 06/23/2022 1239   PLT 199 06/23/2022 1239   MCV 90.7 06/23/2022 1239   MCH 31.6 06/23/2022 1239  MCHC 34.9 06/23/2022 1239   RDW 13.0 06/23/2022 1239   LYMPHSABS 2.5 06/11/2021 1408   MONOABS 0.5 06/11/2021 1408   EOSABS 0.0 06/11/2021 1408   BASOSABS 0.1 06/11/2021 1408    Lithium Lvl  Date Value Ref Range Status  02/20/2019 0.8 0.6 - 1.2 mmol/L Final     No results found for: "PHENYTOIN", "PHENOBARB", "VALPROATE", "CBMZ"   .res Assessment: Plan:    There are no diagnoses linked to this encounter.   Please see After Visit Summary for patient specific instructions.  Future Appointments  Date Time Provider Kenneth  09/20/2022  1:20 PM Minus Breeding, MD [facility] None    No orders of the defined types were placed in this encounter.   -------------------------------

## 2022-07-28 ENCOUNTER — Encounter: Payer: Self-pay | Admitting: Psychiatry

## 2022-07-29 LAB — COMPREHENSIVE METABOLIC PANEL WITH GFR
ALT: 12 IU/L (ref 0–32)
AST: 17 IU/L (ref 0–40)
Albumin/Globulin Ratio: 2.2 (ref 1.2–2.2)
Albumin: 4.2 g/dL (ref 3.8–4.9)
Alkaline Phosphatase: 70 IU/L (ref 44–121)
BUN/Creatinine Ratio: 10 (ref 9–23)
BUN: 21 mg/dL (ref 6–24)
Bilirubin Total: 0.3 mg/dL (ref 0.0–1.2)
CO2: 19 mmol/L — ABNORMAL LOW (ref 20–29)
Calcium: 9.6 mg/dL (ref 8.7–10.2)
Chloride: 108 mmol/L — ABNORMAL HIGH (ref 96–106)
Creatinine, Ser: 2.01 mg/dL — ABNORMAL HIGH (ref 0.57–1.00)
Globulin, Total: 1.9 g/dL (ref 1.5–4.5)
Glucose: 89 mg/dL (ref 70–99)
Potassium: 4.7 mmol/L (ref 3.5–5.2)
Sodium: 144 mmol/L (ref 134–144)
Total Protein: 6.1 g/dL (ref 6.0–8.5)
eGFR: 28 mL/min/1.73 — ABNORMAL LOW

## 2022-07-29 LAB — VALPROIC ACID LEVEL: Valproic Acid Lvl: 63 ug/mL (ref 50–100)

## 2022-08-12 ENCOUNTER — Telehealth: Payer: Self-pay | Admitting: Internal Medicine

## 2022-08-12 NOTE — Telephone Encounter (Signed)
scheduled per 3/8 referral, pt has been called and confirmed date and time. Pt is aware of location and to arrive early for check in

## 2022-08-29 ENCOUNTER — Other Ambulatory Visit: Payer: Self-pay | Admitting: Internal Medicine

## 2022-08-29 ENCOUNTER — Other Ambulatory Visit: Payer: Self-pay | Admitting: Medical Oncology

## 2022-08-29 DIAGNOSIS — N183 Chronic kidney disease, stage 3 unspecified: Secondary | ICD-10-CM

## 2022-08-29 DIAGNOSIS — D649 Anemia, unspecified: Secondary | ICD-10-CM

## 2022-08-31 ENCOUNTER — Other Ambulatory Visit: Payer: Self-pay | Admitting: Medical Oncology

## 2022-08-31 ENCOUNTER — Inpatient Hospital Stay: Payer: Medicare HMO

## 2022-08-31 ENCOUNTER — Inpatient Hospital Stay: Payer: Medicare HMO | Attending: Internal Medicine | Admitting: Internal Medicine

## 2022-09-13 ENCOUNTER — Institutional Professional Consult (permissible substitution): Payer: Medicare HMO | Admitting: Pulmonary Disease

## 2022-09-13 ENCOUNTER — Other Ambulatory Visit: Payer: Self-pay

## 2022-09-13 DIAGNOSIS — F319 Bipolar disorder, unspecified: Secondary | ICD-10-CM

## 2022-09-13 MED ORDER — DIVALPROEX SODIUM ER 250 MG PO TB24: 250.0000 mg | ORAL_TABLET | Freq: Every day | ORAL | 5 refills | Status: DC

## 2022-09-15 LAB — LIPID PANEL
Chol/HDL Ratio: 3.1 ratio (ref 0.0–4.4)
Cholesterol, Total: 177 mg/dL (ref 100–199)
HDL: 58 mg/dL (ref >39)
LDL Chol Calc (NIH): 98 mg/dL (ref 0–99)
Triglycerides: 117 mg/dL (ref 0–149)
VLDL Cholesterol Cal: 21 mg/dL (ref 5–40)

## 2022-09-19 ENCOUNTER — Telehealth: Payer: Self-pay | Admitting: *Deleted

## 2022-09-19 DIAGNOSIS — R072 Precordial pain: Secondary | ICD-10-CM | POA: Insufficient documentation

## 2022-09-19 DIAGNOSIS — R002 Palpitations: Secondary | ICD-10-CM | POA: Insufficient documentation

## 2022-09-19 DIAGNOSIS — E782 Mixed hyperlipidemia: Secondary | ICD-10-CM

## 2022-09-19 NOTE — Telephone Encounter (Signed)
-----   Message from Rollene Rotunda, MD sent at 09/15/2022 10:00 PM EDT ----- I would like to increase the Crestor to 20 mg PO daily and repeat a lipid profile in 3 months.  Call Ms. Donnie Aho with the results and send results to Aliene Beams, MD

## 2022-09-19 NOTE — Telephone Encounter (Signed)
Patient is returning call.  °

## 2022-09-19 NOTE — Progress Notes (Addendum)
  Cardiology Office Note:   Date:  09/20/2022  ID:  [name], DOB Jul 09, 1960, MRN [id number]  History of Present Illness:   Dana [name] is a 62 y.o. female  who presented for evaluation of chest discomfort and palpitations.  She had previously been seen at [facility] she wore a monitor for 3 days.  This demonstrated predominantly normal sinus rhythm.  She had a low supraventricular and ventricular ectopic beat burden.  There was very rare supraventricular tachycardia lasting only 5 beats.  She had normal heart rate variation.  An echocardiogram demonstrated well-preserved ejection fraction.  There were no valvular abnormalities. In Jan she was in the ED with palpitations and chest pain.  She had chest pain without any evidence for ischemia.  She had palpitations.  She does have coronary calcium and aortic atherosclerosis noted on a previous CT. she presents with continued palpitations and continuous pain in her heart.  She wore another monitor and she had NSR with PVCS.    She denies any new cardiovascular.  She is having some lower extremity swelling related to amlodipine that was started by her nephrologist.  She is not having any new shortness of breath, PND or orthopnea.  She is not having any chest pressure, neck or arm discomfort.  She is lost about 7 pounds with diet.  She goes to her grandsons T-ball and her granddaughters softball games and she does a lot of walking there.  ROS: As stated in the HPI and negative for all other systems.  Studies Reviewed:    EKG:  NA   Risk Assessment/Calculations:       Physical Exam:   VS:  BP 118/74   Pulse 75   Ht 5\' 6"  (1.676 m)   Wt 243 lb 3.2 oz (110.3 kg)   SpO2 97%   BMI 39.25 kg/m    Wt Readings from Last 3 Encounters:  09/20/22 243 lb 3.2 oz (110.3 kg)  07/22/22 249 lb (112.9 kg)  06/23/22 250 lb 7.1 oz (113.6 kg)     GEN: Well nourished, well developed in no acute distress NECK: No JVD; No carotid bruits CARDIAC: RRR, no  murmurs, rubs, gallops RESPIRATORY:  Clear to auscultation without rales, wheezing or rhonchi  ABDOMEN: Soft, non-tender, non-distended EXTREMITIES:  Mild ankle edema; No deformity   ASSESSMENT AND PLAN:   Chest pain:    She is not having any further chest discomfort.  No further workup.  She had a negative perfusion study in the facility in June 2023.   Palpitations: Beta-blockers have improved her palpitations.  No change in therapy.   Hypertension: Her blood pressure is controlled and she is worried about the edema.  I am going to reduce her amlodipine to 2.5 mg and she can let me know if her blood pressure increases.  It is Controlled.   Twice CKD IIIb: She follows with nephrology.        DVT:   She continues chronic anticoagulation.   Dyslipidemia:  I increased her Crestor over the phone this week as her LDL was not at the goal of 50.  I will repeat a lipid in 3 months.     Signed, Rollene Rotunda, MD

## 2022-09-19 NOTE — Telephone Encounter (Signed)
Left message for pt to call.

## 2022-09-19 NOTE — Telephone Encounter (Signed)
Pt aware of lab results Per pt has increased medicine on Sat 09/17/22  Pt due for fasting labs in July ./cy

## 2022-09-20 ENCOUNTER — Ambulatory Visit: Payer: Medicare HMO | Attending: Cardiology | Admitting: Cardiology

## 2022-09-20 ENCOUNTER — Encounter: Payer: Self-pay | Admitting: Cardiology

## 2022-09-20 VITALS — BP 118/74 | HR 75 | Ht 66.0 in | Wt 243.2 lb

## 2022-09-20 DIAGNOSIS — R072 Precordial pain: Secondary | ICD-10-CM

## 2022-09-20 DIAGNOSIS — R002 Palpitations: Secondary | ICD-10-CM

## 2022-09-20 DIAGNOSIS — E782 Mixed hyperlipidemia: Secondary | ICD-10-CM | POA: Diagnosis not present

## 2022-09-20 MED ORDER — AMLODIPINE BESYLATE 2.5 MG PO TABS: 2.5000 mg | ORAL_TABLET | Freq: Every day | ORAL | 3 refills | Status: DC

## 2022-09-20 MED ORDER — ROSUVASTATIN CALCIUM 20 MG PO TABS: 20.0000 mg | ORAL_TABLET | Freq: Every day | ORAL | 3 refills | Status: DC

## 2022-09-20 NOTE — Patient Instructions (Signed)
Medication Instructions:   DECREASE AMLODIPINE TO 2.5 MG ONCE DAILY= 1/2 OF THE 5 MG TABLET ONCE DAILY  INCREASE ROSUVASTATIN TO 20 MG ONCE DAILY = 2 OF THE 10 MG TABLETS ONCE DAILY  *If you need a refill on your cardiac medications before your next appointment, please call your pharmacy*   Lab Work: Your physician recommends that you return for lab work in: 3 MONTHS-FASTING  If you have labs (blood work) drawn today and your tests are completely normal, you will receive your results only by: MyChart Message (if you have MyChart) OR A paper copy in the mail If you have any lab test that is abnormal or we need to change your treatment, we will call you to review the results.   Follow-Up: At [facility], you and your health needs are our priority.  As part of our continuing mission to provide you with exceptional heart care, we have created designated Provider Care Teams.  These Care Teams include your primary Cardiologist (physician) and Advanced Practice Providers (APPs -  Physician Assistants and Nurse Practitioners) who all work together to provide you with the care you need, when you need it.  We recommend signing up for the patient portal called "MyChart".  Sign up information is provided on this After Visit Summary.  MyChart is used to connect with patients for Virtual Visits (Telemedicine).  Patients are able to view lab/test results, encounter notes, upcoming appointments, etc.  Non-urgent messages can be sent to your provider as well.   To learn more about what you can do with MyChart, go to [email or web address].    Your next appointment:   12 month(s)  Provider:   Rollene Rotunda, MD

## 2022-09-26 NOTE — Progress Notes (Unsigned)
[facility] [phone]   [phone]  INITIAL CONSULT NOTE  [name] Care Team: Aliene Beams, MD as PCP - General (Family Medicine) Rollene Rotunda, MD as PCP - Cardiology (Cardiology)  Hematological/Oncological History 11/18/2021: Doppler US: Nonocclusive DVT of intdeterminate age in left lower extremity involving common femoral vein. Treated with Eliquis for 3 months. 03/09/2022: Doppler US: Acute, occlusive DVT in right lower extremity involving Dana gastrocnemius veins. Restarted Eliquis therapy 09/27/2022: Establish care with [facility] H[facility]ology  CHIEF COMPLAINTS/PURPOSE OF CONSULTATION:  Recurrent DVT  HISTORY OF PRESENTING ILLNESS:  Dana [name] 62 y.o. female with medical history significant for GERD, COPD, hyperlipidemia, hypertension, bipolar disorder and headaches presents to Dana hematology clinic for evaluation for recurrent DVT. She is unaccompanied for this visit.  On exam today, Dana [name] reports she is tolerating her Eliquis therapy without any toxicities. She does bruise easily but denies any signs of bleeding. She reports Dana swelling and pain in her lower extremities have resolved. She denies any other symptoms. She denies fevers, chills, sweats, shortness of breath, chest pain or cough. Rest of Dana ROS is below.   MEDICAL HISTORY:  Past Medical History:  Diagnosis Date   Arthritis    oa   Bipolar disorder (HCC)    Chronic foot pain    Complication of anesthesia    COPD (chronic obstructive pulmonary disease) (HCC)    DVT (deep venous thrombosis) (HCC)    GERD (gastroesophageal reflux disease)    Headache    migraines   Hyperlipidemia    Hypertension    Neuropathy    Palpitations last 1 month ago   PONV (postoperative nausea and vomiting)    Sleep apnea     SURGICAL HISTORY: Past Surgical History:  Procedure Laterality Date   ABDOMINAL HYSTERECTOMY  1998   complete   CESAREAN SECTION  1991   CHOLECYSTECTOMY  2005    FOOT SURGERY     left hip muscle tear surgery  2008   shoulder scope Left 2015   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   Procedure: LEFT TOTAL HIP ARTHROPLASTY ANTERIOR APPROACH;  Surgeon: Ollen Gross, MD;  Location: [facility];  Service: Orthopedics;  Laterality: Left;   TUBAL LIGATION  1992    SOCIAL HISTORY: Social History   Socioeconomic History   Marital status: Widowed    Spouse name: Not on file   Number of children: 2   Years of education: Not on file   Highest education level: High school graduate  Occupational History    Comment: disabled  Tobacco Use   Smoking status: Former    Packs/day: 1.00    Years: 34.00    Additional pack years: 0.00    Total pack years: 34.00    Types: Cigarettes, E-cigarettes    Quit date: 06/07/2011    Years since quitting: 11.3   Smokeless tobacco: Never  Vaping Use   Vaping Use: Former   Substances: Nicotine, Flavoring  Substance and Sexual Activity   Alcohol use: No   Drug use: No   Sexual activity: Not on file  Other Topics Concern   Not on file  Social History Narrative   Lives alone   Social Determinants of Health   Financial Resource Strain: Not on file  Food Insecurity: No Food Insecurity (09/27/2022)   Hunger Vital Sign    Worried About Running Out of Food in Dana Last Year: Never true    Ran Out of Food in Dana Last Year: Never true  Transportation Needs: No Transportation Needs (09/27/2022)   PRAPARE - Administrator, Civil Service (Medical): No    Lack of Transportation (Non-Medical): No  Physical Activity: Not on file  Stress: Not on file  Social Connections: Not on file  Intimate Partner Violence: Not At Risk (09/27/2022)   Humiliation, Afraid, Rape, and Kick questionnaire    Fear of Current or Ex-Partner: No    Emotionally Abused: No    Physically Abused: No    Sexually Abused: No    FAMILY HISTORY: Family History  Problem Relation Age of Onset   Mood Disorder Mother    Colon cancer Mother     Pulmonary fibrosis Father    Bipolar disorder Brother    Bipolar disorder Son     ALLERGIES:  is allergic to anesthetics, amide; other; oxybutynin; hyoscyamine; and codeine.  MEDICATIONS:  Current Outpatient Medications  Medication Sig Dispense Refill   Acetaminophen (TYLENOL) 325 MG CAPS 3 capsules     amLODipine (NORVASC) 2.5 MG tablet Take 1 tablet (2.5 mg total) by mouth daily. 90 tablet 3   cyclobenzaprine (FLEXERIL) 10 MG tablet Take 10 mg by mouth 3 (three) times daily as needed for muscle spasms.     divalproex (DEPAKOTE ER) 250 MG 24 hr tablet Take 1 tablet (250 mg total) by mouth daily. Take with a 500 mg tablet to equal total dose of 750 mg 30 tablet 5   divalproex (DEPAKOTE ER) 500 MG 24 hr tablet Take 1 tablet (500 mg total) by mouth at bedtime. Take with a 250 mg tablet to equal total dose of 750 mg 30 tablet 5   ELIQUIS 5 MG TABS tablet Take 5 mg by mouth 2 (two) times daily.     esomeprazole (NEXIUM) 40 MG capsule Take 40 mg by mouth 2 (two) times daily before a meal.     fluticasone (FLONASE) 50 MCG/ACT nasal spray Place 1 spray into Dana nose daily.     furosemide (LASIX) 20 MG tablet Take 20 mg by mouth every other day.     gabapentin (NEURONTIN) 300 MG capsule Take 300 mg by mouth 3 (three) times daily.     metoprolol tartrate (LOPRESSOR) 25 MG tablet Take 1 tablet (25 mg total) by mouth 2 (two) times daily. 180 tablet 3   ondansetron (ZOFRAN) 4 MG tablet as needed.     oxyCODONE (OXY IR/ROXICODONE) 5 MG immediate release tablet Take 5 mg by mouth every 12 (twelve) hours as needed.     rizatriptan (MAXALT) 10 MG tablet Take by mouth.     rosuvastatin (CRESTOR) 20 MG tablet Take 1 tablet (20 mg total) by mouth daily. 90 tablet 3   tamsulosin (FLOMAX) 0.4 MG CAPS capsule Take 0.4 mg by mouth daily.     ALPRAZolam (XANAX) 0.5 MG tablet Take 1 tablet (0.5 mg total) by mouth 2 (two) times daily as needed for anxiety. ([name] not taking: Reported on 09/20/2022) 60 tablet 5    Cholecalciferol (VITAMIN D) 2000 units tablet Take by mouth. ([name] not taking: Reported on 09/20/2022)     lisinopril (ZESTRIL) 5 MG tablet Take 5 mg by mouth daily. ([name] not taking: Reported on 09/20/2022)     No current facility-administered medications for this visit.    REVIEW OF SYSTEMS:   Constitutional: ( - ) fevers, ( - )  chills , ( - ) night sweats Eyes: ( - ) blurriness of vision, ( - ) double vision, ( - ) watery eyes Ears,  nose, mouth, throat, and face: ( - ) mucositis, ( - ) sore throat Respiratory: ( - ) cough, ( - ) dyspnea, ( - ) wheezes Cardiovascular: ( - ) palpitation, ( - ) chest discomfort, ( - ) lower extremity swelling Gastrointestinal:  ( - ) nausea, ( - ) heartburn, ( - ) change in bowel habits Skin: ( - ) abnormal skin rashes Lymphatics: ( - ) new lymphadenopathy, ( - ) easy bruising Neurological: ( - ) numbness, ( - ) tingling, ( - ) new weaknesses Behavioral/Psych: ( - ) mood change, ( - ) new changes  All other systems were reviewed with Dana [name] and are negative.  PHYSICAL EXAMINATION: ECOG PERFORMANCE STATUS: 0 - Asymptomatic  Vitals:   09/27/22 0906  BP: 120/79  Pulse: 68  Resp: 18  Temp: 98.7 F (37.1 C)  SpO2: 97%   Filed Weights   09/27/22 0906  Weight: 245 lb (111.1 kg)    GENERAL: well appearing female in NAD  SKIN: skin color, texture, turgor are normal, no rashes or significant lesions EYES: conjunctiva are pink and non-injected, sclera clear LUNGS: clear to auscultation and percussion with normal breathing effort HEART: regular rate & rhythm and no murmurs and no lower extremity edema Musculoskeletal: no cyanosis of digits and no clubbing  PSYCH: alert & oriented x 3, fluent speech NEURO: no focal motor/sensory deficits  LABORATORY DATA:  I have reviewed Dana data as listed    Latest Ref Rng & Units 09/27/2022   10:21 AM 06/23/2022   12:39 PM 10/17/2021    4:57 PM  CBC  WBC 4.0 - 10.5 K/uL 7.3  6.6  6.9   Hemoglobin  12.0 - 15.0 g/dL 29.5  62.1  30.8   Hematocrit 36.0 - 46.0 % 43.1  43.0  43.0   Platelets 150 - 400 K/uL 169  199  175        Latest Ref Rng & Units 09/27/2022   10:21 AM 07/27/2022    9:41 AM 06/23/2022   12:39 PM  CMP  Glucose 70 - 99 mg/dL 82  89  657   BUN 6 - 20 mg/dL 21  21  18    Creatinine 0.44 - 1.00 mg/dL 8.46  9.62  9.52   Sodium 135 - 145 mmol/L 145  144  141   Potassium 3.5 - 5.1 mmol/L 4.4  4.7  4.2   Chloride 98 - 111 mmol/L 111  108  108   CO2 22 - 32 mmol/L 27  19  22    Calcium 8.9 - 10.3 mg/dL 84.1  9.6  32.4   Total Protein 6.5 - 8.1 g/dL 6.7  6.1    Total Bilirubin 0.3 - 1.2 mg/dL 0.5  0.3    Alkaline Phos 38 - 126 U/L 56  70    AST 15 - 41 U/L 14  17    ALT 0 - 44 U/L 17  12       ASSESSMENT & PLAN Dana [name] is 62 y.o. female who presents to Dana clinic for recurrent DVT involving Dana lower extremities.   We reviewed risk factors that can provoke a venous thromboembolism (VTE) including prolonged travel/immobility, surgery (particular abdominal or orthropedic), trauma,  and pregnancy/ estrogen containing birth control. After a detailed history and review of Dana records there is no clear provoking factor for this [name]'s VTE.  Patients with unprovoked VTEs have up to 25% recurrence after 5 years and 36% at 10 years, with 4%  of these clots being fatal ([phone]). Therefore Dana formal recommendation for unprovoked VTE's is lifelong anticoagulation, as Dana cause may not be transient or reversible. We recommend 6 months or full strength anticoagulation with a re-evaluation after that time.  Dana [name]'s will then have a choice of maintenance dose DOAC (preferred, recommended), 81mg  ASA PO daily (non-preferred), or no further anticoagulation (not recommended).   #Unprovoked DVT --First episode occurred on 11/18/2021. Nonocclusive DVT of intdeterminate age in left lower extremity involving common femoral vein. Felt to be unprovoked. Treated with Eliquis for  3 months. --Second episode occurred on 03/09/2022.Acute, occlusive DVT in right lower extremity involving Dana gastrocnemius veins. Felt to be unprovoked. Restarted Eliquis therapy --Findings at this time are consistent with a unprovoked VTE --will order baseline CMP and CBC to assure labs are adequate for DOAC therapy --will check beta-2 glycoprotein abs and cardiolipin abs to evaluate for antiphospholipid syndrome. --Recommend indefinite anticoagulation. [name] is currently on eliquis 5mg  BID. Since she has completed 6 months of full dose treatment, she is eligible for maintenance dose of 2.5 mg BID. New prescription was sent.  --[name] denies any bleeding, bruising, or dark stools on this medication. It is well tolerated. No difficulties accessing/affording Dana medication --RTC in 6 months' time with strict return precautions for overt signs of bleeding.    Orders Placed This Encounter  Procedures   CBC with Differential (Cancer Center Only)    Standing Status:   Future    Standing Expiration Date:   09/27/2023   CMP (Cancer Center only)    Standing Status:   Future    Standing Expiration Date:   09/27/2023   Cardiolipin antibodies, IgG, IgM, IgA*    Standing Status:   Future    Standing Expiration Date:   09/27/2023   Beta-2-glycoprotein i abs, IgG/M/A    Standing Status:   Future    Standing Expiration Date:   09/27/2023    All questions were answered. Dana [name] knows to call Dana clinic with any problems, questions or concerns.  I have spent a total of 60 minutes minutes of face-to-face and non-face-to-face time, preparing to see Dana [name], obtaining and/or reviewing separately obtained history, performing a medically appropriate examination, counseling and educating Dana [name], ordering medications/tests/procedures, referring and communicating with other health care professionals, documenting clinical information in Dana electronic health record, independently interpreting results  and communicating results to Dana [name], and care coordination.   Georga Kaufmann, PA-C Department of Hematology/Oncology [facility] at [facility] Phone: [phone]  I have read Dana above note and personally examined Dana [name]. I agree with Dana assessment and plan as noted above.  Ulysees Barns, MD Department of Hematology/Oncology [facility] at [facility] Phone: [phone] Pager: [phone] Email: [email or web address]

## 2022-09-27 ENCOUNTER — Encounter: Payer: Self-pay | Admitting: Physician Assistant

## 2022-09-27 ENCOUNTER — Inpatient Hospital Stay: Payer: Medicare HMO | Admitting: Physician Assistant

## 2022-09-27 ENCOUNTER — Inpatient Hospital Stay: Payer: Medicare HMO | Attending: Internal Medicine

## 2022-09-27 ENCOUNTER — Other Ambulatory Visit: Payer: Self-pay

## 2022-09-27 VITALS — BP 120/79 | HR 68 | Temp 98.7°F | Resp 18 | Wt 245.0 lb

## 2022-09-27 DIAGNOSIS — Z7901 Long term (current) use of anticoagulants: Secondary | ICD-10-CM | POA: Diagnosis not present

## 2022-09-27 DIAGNOSIS — K219 Gastro-esophageal reflux disease without esophagitis: Secondary | ICD-10-CM | POA: Diagnosis not present

## 2022-09-27 DIAGNOSIS — N183 Chronic kidney disease, stage 3 unspecified: Secondary | ICD-10-CM

## 2022-09-27 DIAGNOSIS — Z79899 Other long term (current) drug therapy: Secondary | ICD-10-CM | POA: Insufficient documentation

## 2022-09-27 DIAGNOSIS — E785 Hyperlipidemia, unspecified: Secondary | ICD-10-CM | POA: Insufficient documentation

## 2022-09-27 DIAGNOSIS — J449 Chronic obstructive pulmonary disease, unspecified: Secondary | ICD-10-CM | POA: Diagnosis not present

## 2022-09-27 DIAGNOSIS — I82599 Chronic embolism and thrombosis of other specified deep vein of unspecified lower extremity: Secondary | ICD-10-CM

## 2022-09-27 DIAGNOSIS — Z800 Family history of malignant neoplasm of digestive organs: Secondary | ICD-10-CM | POA: Diagnosis not present

## 2022-09-27 DIAGNOSIS — Z86718 Personal history of other venous thrombosis and embolism: Secondary | ICD-10-CM

## 2022-09-27 DIAGNOSIS — I10 Essential (primary) hypertension: Secondary | ICD-10-CM | POA: Diagnosis not present

## 2022-09-27 DIAGNOSIS — D649 Anemia, unspecified: Secondary | ICD-10-CM

## 2022-09-27 DIAGNOSIS — F319 Bipolar disorder, unspecified: Secondary | ICD-10-CM | POA: Insufficient documentation

## 2022-09-27 DIAGNOSIS — Z87891 Personal history of nicotine dependence: Secondary | ICD-10-CM | POA: Diagnosis not present

## 2022-09-27 LAB — CMP (CANCER CENTER ONLY)
ALT: 17 U/L (ref 0–44)
AST: 14 U/L — ABNORMAL LOW (ref 15–41)
Albumin: 4.3 g/dL (ref 3.5–5.0)
Alkaline Phosphatase: 56 U/L (ref 38–126)
Anion gap: 7 (ref 5–15)
BUN: 21 mg/dL — ABNORMAL HIGH (ref 6–20)
CO2: 27 mmol/L (ref 22–32)
Calcium: 10.2 mg/dL (ref 8.9–10.3)
Chloride: 111 mmol/L (ref 98–111)
Creatinine: 1.76 mg/dL — ABNORMAL HIGH (ref 0.44–1.00)
GFR, Estimated: 33 mL/min — ABNORMAL LOW (ref >60)
Glucose, Bld: 82 mg/dL (ref 70–99)
Potassium: 4.4 mmol/L (ref 3.5–5.1)
Sodium: 145 mmol/L (ref 135–145)
Total Bilirubin: 0.5 mg/dL (ref 0.3–1.2)
Total Protein: 6.7 g/dL (ref 6.5–8.1)

## 2022-09-27 LAB — CBC WITH DIFFERENTIAL (CANCER CENTER ONLY)
Abs Immature Granulocytes: 0.01 10*3/uL (ref 0.00–0.07)
Basophils Absolute: 0.1 10*3/uL (ref 0.0–0.1)
Basophils Relative: 1 %
Eosinophils Absolute: 0 10*3/uL (ref 0.0–0.5)
Eosinophils Relative: 0 %
HCT: 43.1 % (ref 36.0–46.0)
Hemoglobin: 14.7 g/dL (ref 12.0–15.0)
Immature Granulocytes: 0 %
Lymphocytes Relative: 35 %
Lymphs Abs: 2.6 10*3/uL (ref 0.7–4.0)
MCH: 31.5 pg (ref 26.0–34.0)
MCHC: 34.1 g/dL (ref 30.0–36.0)
MCV: 92.3 fL (ref 80.0–100.0)
Monocytes Absolute: 0.6 10*3/uL (ref 0.1–1.0)
Monocytes Relative: 8 %
Neutro Abs: 4.1 10*3/uL (ref 1.7–7.7)
Neutrophils Relative %: 56 %
Platelet Count: 169 10*3/uL (ref 150–400)
RBC: 4.67 MIL/uL (ref 3.87–5.11)
RDW: 13.1 % (ref 11.5–15.5)
WBC Count: 7.3 10*3/uL (ref 4.0–10.5)
nRBC: 0 % (ref 0.0–0.2)

## 2022-09-29 ENCOUNTER — Telehealth: Payer: Self-pay | Admitting: Hematology and Oncology

## 2022-09-29 NOTE — Telephone Encounter (Signed)
Reached out to patient to schedule per 4/23 LOS, no answer. Mailing appointment reminder.

## 2022-10-02 MED ORDER — APIXABAN 2.5 MG PO TABS: 2.5000 mg | ORAL_TABLET | Freq: Two times a day (BID) | ORAL | 6 refills | Status: DC

## 2022-10-04 ENCOUNTER — Inpatient Hospital Stay: Payer: Medicare HMO

## 2022-10-04 ENCOUNTER — Telehealth: Payer: Self-pay | Admitting: Hematology and Oncology

## 2022-10-04 NOTE — Telephone Encounter (Signed)
patient called to cancel her labs today, she has an emergency come up and will call back to reschedule.

## 2022-10-05 ENCOUNTER — Telehealth: Payer: Self-pay | Admitting: Hematology and Oncology

## 2022-10-06 ENCOUNTER — Other Ambulatory Visit: Payer: Self-pay

## 2022-10-06 ENCOUNTER — Inpatient Hospital Stay: Payer: Medicare HMO | Attending: Internal Medicine

## 2022-10-06 DIAGNOSIS — Z7901 Long term (current) use of anticoagulants: Secondary | ICD-10-CM | POA: Insufficient documentation

## 2022-10-06 DIAGNOSIS — Z86718 Personal history of other venous thrombosis and embolism: Secondary | ICD-10-CM | POA: Insufficient documentation

## 2022-10-06 DIAGNOSIS — I82599 Chronic embolism and thrombosis of other specified deep vein of unspecified lower extremity: Secondary | ICD-10-CM

## 2022-10-07 ENCOUNTER — Other Ambulatory Visit: Payer: Self-pay

## 2022-10-07 ENCOUNTER — Telehealth: Payer: Self-pay | Admitting: Medical Oncology

## 2022-10-07 DIAGNOSIS — M7989 Other specified soft tissue disorders: Secondary | ICD-10-CM

## 2022-10-07 NOTE — Progress Notes (Signed)
This nurse reached out to patient related to leg cramps and swelling.  Advised that provider has ordered a doppler to rule out DVT.  Patient is in agreement and request an appointment for Monday  Doppler scheduled for Monday 5/6 at 10 am.  Patient is in agreement.  No further questions or concerns noted at this time.

## 2022-10-07 NOTE — Telephone Encounter (Signed)
Bilateral lower leg cramps-Started Wed night . -States this is "the first sign of blood clots in my  legs ". Denies warmth to legs.  Pain is described as cramp -like in calves. She thinks she may have a blood clot due to a lowered  dose of Eloquis.Marland Kitchen  However, when I returned her call she said she just took a tsp of mustard and her leg cramps are " a lot better". She said her L  leg  may be swollen more than right calf.  She said Danielle ( at Dr Guss Bunde) called her back too and said  can do a "study on her legs"  .   Pt said Dr Leonides Schanz said she should call him for pain.

## 2022-10-09 LAB — BETA-2-GLYCOPROTEIN I ABS, IGG/M/A
Beta-2 Glyco I IgG: <9 GPI IgG units (ref 0–20)
Beta-2-Glycoprotein I IgA: <9 GPI IgA units (ref 0–25)
Beta-2-Glycoprotein I IgM: <9 GPI IgM units (ref 0–32)

## 2022-10-10 ENCOUNTER — Ambulatory Visit (HOSPITAL_COMMUNITY)
Admission: RE | Admit: 2022-10-10 | Discharge: 2022-10-10 | Disposition: A | Discharge status: Home / Self Care | Payer: Medicare HMO | Source: Ambulatory Visit | Attending: Physician Assistant | Admitting: Physician Assistant

## 2022-10-10 ENCOUNTER — Telehealth: Payer: Self-pay | Admitting: Medical Oncology

## 2022-10-10 DIAGNOSIS — M7989 Other specified soft tissue disorders: Secondary | ICD-10-CM | POA: Diagnosis not present

## 2022-10-10 LAB — CARDIOLIPIN ANTIBODIES, IGG, IGM, IGA
Anticardiolipin IgA: <9 APL U/mL (ref 0–11)
Anticardiolipin IgG: <9 GPL U/mL (ref 0–14)
Anticardiolipin IgM: <9 MPL U/mL (ref 0–12)

## 2022-10-10 NOTE — Telephone Encounter (Signed)
Leg  cramps-Her lower legs are not cramping but her R lower leg " calf" is painful.She is going to get LE Venous doppler today scheduled at 1000.   I reviewed her beta-2 glycoprotein labs results and told her they are all within normal range.

## 2022-10-12 ENCOUNTER — Telehealth: Payer: Self-pay | Admitting: Physician Assistant

## 2022-10-12 NOTE — Telephone Encounter (Signed)
I called Ms. Dana [name] to review the labs from 09/27/2022 and 10/06/2022. There is no evidence of antiphospholipid syndrome. Recommend to continue with Eliquis 2.5 mg BID. We will see patient back in 6 months with repeat labs. Patient still has leg cramps but I reassured her that doppler US was negative for DVT. I advised her to follow up with her PCP.

## 2022-10-24 ENCOUNTER — Encounter: Payer: Self-pay | Admitting: Psychiatry

## 2022-10-24 ENCOUNTER — Ambulatory Visit (INDEPENDENT_AMBULATORY_CARE_PROVIDER_SITE_OTHER): Payer: Medicare HMO | Admitting: Psychiatry

## 2022-10-24 VITALS — Wt 245.0 lb

## 2022-10-24 DIAGNOSIS — F419 Anxiety disorder, unspecified: Secondary | ICD-10-CM | POA: Diagnosis not present

## 2022-10-24 DIAGNOSIS — F319 Bipolar disorder, unspecified: Secondary | ICD-10-CM

## 2022-10-24 DIAGNOSIS — F5101 Primary insomnia: Secondary | ICD-10-CM | POA: Diagnosis not present

## 2022-10-24 MED ORDER — DIVALPROEX SODIUM ER 500 MG PO TB24: 500.0000 mg | ORAL_TABLET | Freq: Every day | ORAL | 1 refills | Status: DC

## 2022-10-24 MED ORDER — DIVALPROEX SODIUM ER 250 MG PO TB24
250.0000 mg | ORAL_TABLET | Freq: Every day | ORAL | 1 refills | Status: DC
Start: 2022-10-24 — End: 2023-01-25

## 2022-10-24 NOTE — Progress Notes (Unsigned)
Dana [name] [id number] [birth year] 62 y.o.  Subjective:   Patient ID:  Dana [name] is a 62 y.o. (DOB December 20, 1960) female.  Chief Complaint:  Chief Complaint  Patient presents with   Follow-up    Bipolar disorder, anxiety, and insomnia    HPI Dana [name] presents to the office today for follow-up of Bipolar Disorder, anxiety, and insomnia.   Oldest sister died at age 35 yo. Sister had pulmonary fibrosis. Sister developed pneumonia and went to hospital. She reports that she went to stay to stay with sister some when sister was discharged, Sister had more difficulty about a week later and declined quickly. She was with her sister for most of the time while sister was dying. She reports that she had to take Xanax prn a few times to help with sleep around sister's death. She reports that she has not been taking Xanax prn very often otherwise.   Denies depressed mood. Denies any manic s/s. Denies any impulsive behavior or excessive spending. She has been intentionally losing weight. Appetite has been ok. She reports sleeping well. She has been going to bed early and wakes up early. Goes to bed around 8 pm. Concentration has been "just ok." She reports that she will occ forget things. She reports that neurologist administered a screening and it was normal. Denies SI.   She reports that her anxiety has been "ok." Energy was really low with recent infections. She reports that energy has improved with antibiotics and streroids.   She has been enjoying grandchildren. She has gotten "really close" with son and his family. Recently celebrated her 62th birthday. She has been singing some at church. She has lost 3 siblings. She now only has one sister.   She started reading her Bible more in 2022 when son went overseas. She just recently read the Bible from Genesis to Revelation. Involved with her church.   She has been going to the [facility].  Alprazolam last filled 01/19/22.  Past Psychiatric  Medication Trials: Lithium-has taken since age 44.  Was on 600 mg twice daily for most of this duration Carbamazepine-flulike signs and symptoms, body aches, headaches, dizziness, and itching Gabapentin Depakote- Unsteadiness, falls Latuda-headaches, itching Xanax Hydroxyzine   PHQ2-9    Flowsheet Row Office Visit from 09/27/2022 in [facility] at [facility]  PHQ-2 Total Score 0      Flowsheet Row ED from 06/23/2022 in [facility] Emergency Department at [facility] ED from 10/17/2021 in [facility] Emergency Department at [facility] ED from 10/06/2021 in [facility] Emergency Department at [facility]  C-SSRS RISK CATEGORY No Risk No Risk No Risk        Review of Systems:  Review of Systems  HENT:         Recent sinus and ear infection  Musculoskeletal:  Positive for back pain. Negative for gait problem.       Right hip pain  Neurological:  Negative for tremors.       Difficulty with balance.   Psychiatric/Behavioral:         Please refer to HPI    Medications: I have reviewed the patient's current medications.  Current Outpatient Medications  Medication Sig Dispense Refill   Acetaminophen (TYLENOL) 325 MG CAPS 3 capsules     amLODipine (NORVASC) 2.5 MG tablet Take 1 tablet (2.5 mg total) by mouth daily. 90 tablet 3   apixaban (ELIQUIS) 2.5 MG TABS tablet Take 1 tablet (2.5  mg total) by mouth 2 (two) times daily. 60 tablet 6   cyclobenzaprine (FLEXERIL) 10 MG tablet Take 10 mg by mouth 3 (three) times daily as needed for muscle spasms.     esomeprazole (NEXIUM) 40 MG capsule Take 40 mg by mouth 2 (two) times daily before a meal.     fluticasone (FLONASE) 50 MCG/ACT nasal spray Place 1 spray into the nose daily.     furosemide (LASIX) 20 MG tablet Take 20 mg by mouth every other day.     gabapentin (NEURONTIN) 300 MG capsule Take 300 mg by mouth 3 (three) times daily.     metoprolol tartrate (LOPRESSOR) 25 MG tablet Take 1  tablet (25 mg total) by mouth 2 (two) times daily. 180 tablet 3   ondansetron (ZOFRAN) 4 MG tablet as needed.     oxyCODONE (OXY IR/ROXICODONE) 5 MG immediate release tablet Take 5 mg by mouth every 12 (twelve) hours as needed.     rizatriptan (MAXALT) 10 MG tablet Take by mouth.     rosuvastatin (CRESTOR) 20 MG tablet Take 1 tablet (20 mg total) by mouth daily. 90 tablet 3   tamsulosin (FLOMAX) 0.4 MG CAPS capsule Take 0.4 mg by mouth daily.     ALPRAZolam (XANAX) 0.5 MG tablet Take 1 tablet (0.5 mg total) by mouth 2 (two) times daily as needed for anxiety. (Patient not taking: Reported on 09/20/2022) 60 tablet 5   divalproex (DEPAKOTE ER) 250 MG 24 hr tablet Take 1 tablet (250 mg total) by mouth daily. Take with a 500 mg tablet to equal total dose of 750 mg 90 tablet 1   divalproex (DEPAKOTE ER) 500 MG 24 hr tablet Take 1 tablet (500 mg total) by mouth at bedtime. Take with a 250 mg tablet to equal total dose of 750 mg 90 tablet 1   No current facility-administered medications for this visit.    Medication Side Effects: None  Allergies:  Allergies  Allergen Reactions   Anesthetics, Amide Nausea And Vomiting   Other    Oxybutynin Other (See Comments)   Hyoscyamine    Codeine Nausea And Vomiting and Nausea Only    Past Medical History:  Diagnosis Date   Arthritis    oa   Bipolar disorder (HCC)    Chronic foot pain    Complication of anesthesia    COPD (chronic obstructive pulmonary disease) (HCC)    DVT (deep venous thrombosis) (HCC)    GERD (gastroesophageal reflux disease)    Headache    migraines   Hyperlipidemia    Hypertension    Neuropathy    Palpitations last 1 month ago   PONV (postoperative nausea and vomiting)    Sleep apnea     Past Medical History, Surgical history, Social history, and Family history were reviewed and updated as appropriate.   Please see review of systems for further details on the patient's review from today.   Objective:   Physical  Exam:  There were no vitals taken for this visit.  Physical Exam Constitutional:      General: She is not in acute distress. Musculoskeletal:        General: No deformity.  Neurological:     Mental Status: She is alert and oriented to person, place, and time.     Coordination: Coordination normal.  Psychiatric:        Attention and Perception: Attention and perception normal. She does not perceive auditory or visual hallucinations.  Mood and Affect: Mood normal. Mood is not anxious or depressed. Affect is not labile, blunt, angry or inappropriate.        Speech: Speech normal.        Behavior: Behavior normal.        Thought Content: Thought content normal. Thought content is not paranoid or delusional. Thought content does not include homicidal or suicidal ideation. Thought content does not include homicidal or suicidal plan.        Cognition and Memory: Cognition and memory normal.        Judgment: Judgment normal.     Comments: Insight intact     Lab Review:     Component Value Date/Time   NA 145 09/27/2022 1021   NA 144 07/27/2022 0941   K 4.4 09/27/2022 1021   CL 111 09/27/2022 1021   CO2 27 09/27/2022 1021   GLUCOSE 82 09/27/2022 1021   BUN 21 (H) 09/27/2022 1021   BUN 21 07/27/2022 0941   CREATININE 1.76 (H) 09/27/2022 1021   CREATININE 1.57 (H) 02/20/2019 0934   CALCIUM 10.2 09/27/2022 1021   PROT 6.7 09/27/2022 1021   PROT 6.1 07/27/2022 0941   ALBUMIN 4.3 09/27/2022 1021   ALBUMIN 4.2 07/27/2022 0941   AST 14 (L) 09/27/2022 1021   ALT 17 09/27/2022 1021   ALKPHOS 56 09/27/2022 1021   BILITOT 0.5 09/27/2022 1021   GFRNONAA 33 (L) 09/27/2022 1021   GFRAA >60 03/19/2015 0533       Component Value Date/Time   WBC 7.3 09/27/2022 1021   WBC 6.6 06/23/2022 1239   RBC 4.67 09/27/2022 1021   HGB 14.7 09/27/2022 1021   HCT 43.1 09/27/2022 1021   PLT 169 09/27/2022 1021   MCV 92.3 09/27/2022 1021   MCH 31.5 09/27/2022 1021   MCHC 34.1 09/27/2022 1021    RDW 13.1 09/27/2022 1021   LYMPHSABS 2.6 09/27/2022 1021   MONOABS 0.6 09/27/2022 1021   EOSABS 0.0 09/27/2022 1021   BASOSABS 0.1 09/27/2022 1021    Lithium Lvl  Date Value Ref Range Status  02/20/2019 0.8 0.6 - 1.2 mmol/L Final     Lab Results  Component Value Date   VALPROATE 63 07/27/2022     .res Assessment: Plan:    Will continue Depakote ER 750 mg po QHS for mood stabilization.  She reports that she has rarely taken Alprazolam prn anxiety or insomnia and does not need a new script at this time. Pt reports hat she will contact office if she needs a script before her next apt.  Lab results reviewed and discussed.  Pt to follow-up in 4 months or sooner if clinically indicated.  Patient advised to contact office with any questions, adverse effects, or acute worsening in signs and symptoms.   Harry was seen today for follow-up.  Diagnoses and all orders for this visit:  Bipolar I disorder (HCC) -     divalproex (DEPAKOTE ER) 500 MG 24 hr tablet; Take 1 tablet (500 mg total) by mouth at bedtime. Take with a 250 mg tablet to equal total dose of 750 mg -     divalproex (DEPAKOTE ER) 250 MG 24 hr tablet; Take 1 tablet (250 mg total) by mouth daily. Take with a 500 mg tablet to equal total dose of 750 mg  Anxiety disorder, unspecified type  Primary insomnia     Please see After Visit Summary for patient specific instructions.  Future Appointments  Date Time Provider Department Center  02/24/2023 10:00  AM Corie Chiquito, PMHNP [facility] None  03/31/2023  9:30 AM [facility] None  03/31/2023 10:00 AM Jaci Standard, MD [facility] None    No orders of the defined types were placed in this encounter.   -------------------------------

## 2022-11-14 ENCOUNTER — Ambulatory Visit (INDEPENDENT_AMBULATORY_CARE_PROVIDER_SITE_OTHER): Payer: Medicare HMO | Admitting: Internal Medicine

## 2022-11-14 ENCOUNTER — Encounter: Payer: Self-pay | Admitting: Internal Medicine

## 2022-11-14 VITALS — BP 134/68 | HR 72 | Temp 98.3°F | Ht 66.0 in | Wt 246.0 lb

## 2022-11-14 DIAGNOSIS — R0789 Other chest pain: Secondary | ICD-10-CM | POA: Diagnosis not present

## 2022-11-14 NOTE — Progress Notes (Signed)
Dana [name]    [id number]    1961/02/25  Primary Care Physician:Hagler, Fleet Contras, MD  Referring Physician: Aliene Beams, MD [address] [place],  [place] [address] Reason for Consultation: chest discomfort.  Date of Consultation: 11/14/2022  Chief complaint:   Chief Complaint  Patient presents with   Pulmonary Consult    Referred by Dr. Antoine Poche. She c/o chest discomfort over the past several wks. She was treated for bronchitis- steroid inj given.  Chest pain is sharp and come and goes and does not occur on exertion.      HPI: Dana [name] is a 62 y.o. woman who presents for new patient evalution for shortness of breath and chest discomfort.   Her sister died earlier this year from pulmonary fibrosis. After she died she had an ear infection and sinus infection, went to urgent care, was given some steroids and augmentin. She reports feeling very weak. She denied dyspnea.   In the end of May she was treated with bronchitis. She wasn't coughing. She was having chest discomfort. She was given steroids and antibiotics again   She has also been diagnosed with costochondritis.  She denies dyspnea, coughing.   Pain is slightly right of her sternum. It comes and goes over the last few weeks. Lasts for seconds. Hurts when she burps. Denies reflux/heartburn. She is on nexium already.  Pain is worse with sitting. She takes oxycodone for chronic pain through her spinal surgeon.  She tries not to take tylenol because she's worried for liver disease.  She is not able to take ibuprofen due to kidney disease  She has chronic lower extremity edema. She is on lasix every other day.     Social history:  Occupation: Exposures: Smoking history:  Social History   Occupational History    Comment: disabled  Tobacco Use   Smoking status: Former    Packs/day: 1.00    Years: 34.00    Additional pack years: 0.00    Total pack years: 34.00    Types: Cigarettes,  E-cigarettes    Quit date: 06/07/2011    Years since quitting: 11.4   Smokeless tobacco: Never  Vaping Use   Vaping Use: Every day   Start date: 02/04/2022   Substances: Nicotine, Flavoring  Substance and Sexual Activity   Alcohol use: No   Drug use: No   Sexual activity: Not on file    Relevant family history:  Family History  Problem Relation Age of Onset   Mood Disorder Mother    Colon cancer Mother    Pulmonary fibrosis Father    Idiopathic pulmonary fibrosis Father        worked in a mill/asbestos exp   Pulmonary fibrosis Sister    Bipolar disorder Brother    Bipolar disorder Son     Past Medical History:  Diagnosis Date   Arthritis    oa   Bipolar disorder (HCC)    Chronic foot pain    Complication of anesthesia    COPD (chronic obstructive pulmonary disease) (HCC)    DVT (deep venous thrombosis) (HCC)    GERD (gastroesophageal reflux disease)    Headache    migraines   Hyperlipidemia    Hypertension    Neuropathy    Palpitations last 1 month ago   PONV (postoperative nausea and vomiting)    Sleep apnea     Past Surgical History:  Procedure Laterality Date   ABDOMINAL HYSTERECTOMY  1998  complete   CESAREAN SECTION  1991   CHOLECYSTECTOMY  2005   FOOT SURGERY     left hip muscle tear surgery  2008   shoulder scope Left 2015   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 Gen:      No acute distress ENT:  no nasal polyps, mucus membranes moist Lungs:    No increased respiratory effort, symmetric chest wall excursion, clear to auscultation bilaterally, no wheezes or crackles CV:         Regular rate and rhythm; no  murmurs, rubs, or gallops.  No pedal edema Abd:      + bowel sounds; soft, non-tender; no distension MSK: no acute synovitis of DIP or PIP joints, no mechanics hands. Reproducible chest wall pain right of the lower sternum  Skin:      Warm and dry; no rashes Neuro: normal speech, no focal facial asymmetry Psych: alert and oriented x3, normal mood and affect   Data Reviewed/Medical Decision Making:  Independent interpretation of tests: Imaging:  Review of patient's CTPE study July 2023 images revealed no acute bPE but does have some centrilobular emphysema. The patient's images have been independently reviewed by me.    PFTs:  Labs:  Lab Results  Component Value Date   NA 145 09/27/2022   K 4.4 09/27/2022   CO2 27 09/27/2022   GLUCOSE 82 09/27/2022   BUN 21 (H) 09/27/2022   CREATININE 1.76 (H) 09/27/2022   CALCIUM 10.2 09/27/2022   GFR 33.20 (L) 12/16/2019   EGFR 28 (L) 07/27/2022   GFRNONAA 33 (L) 09/27/2022   Lab Results  Component Value Date   WBC 7.3 09/27/2022   HGB 14.7 09/27/2022   HCT 43.1 09/27/2022   MCV 92.3 09/27/2022   PLT 169 09/27/2022     Immunization status:  Immunization History  Administered Date(s) Administered   Influenza Inj Mdck Quad Pf 02/21/2020, 03/08/2021   Influenza Split 03/10/2014, 03/08/2018, 03/16/2022   Influenza Whole 03/08/2018   Influenza,inj,Quad PF,6+ Mos 03/08/2018, 03/07/2019, 03/22/2019   Influenza,inj,quad, With Preservative 03/07/2019   Influenza,trivalent, recombinat, inj, PF 03/10/2014   Moderna Sars-Covid-2 Vaccination 09/05/2019, 10/01/2019   PFIZER(Purple Top)SARS-COV-2 Vaccination 09/05/2019, 10/03/2019, 10/24/2019   Tdap 05/03/2022     I reviewed prior external note(s) from cardiology  I reviewed the result(s) of the labs and imaging as noted above.   I have ordered   Assessment:  Chest wall pain - likely costochondritis.  Vaping  Plan/Recommendations:  Your chest pain is uncomfortable but reassuring.    I do not think it is related to anything in your lungs.  I recommend tylenol up to 2 g daily to help with the pain. Agree with avoiding nsaids due your kidney disease.   You can also try lidocaine patches like salon-pas over the counter, warm compress, massage.   Try cutting back on the amount of nicotine in your vape pens to help quit using.   We discussed disease management and progression at length today.    Return to Care: Return if symptoms worsen or fail to improve.  Durel Salts, MD Pulmonary and Critical Care Medicine [facility] Office:[phone]  CC:  Aliene Beams, MD

## 2022-11-14 NOTE — Patient Instructions (Signed)
Your chest pain is uncomfortable but reassuring.   I do not think it is related to anything in your lungs.  I recommend tylenol up to 2 g daily to help with the pain. Agree with avoiding nsaids due your kidney disease.   You can also try lidocaine patches like salon-pas over the counter, warm compress, massage.   Try cutting back on the amount of nicotine in your vape pens to help quit using.   What are the benefits of quitting smoking? Quitting smoking can lower your chances of getting or dying from heart disease, lung disease, kidney failure, infection, or cancer. It can also lower your chances of getting osteoporosis, a condition that makes your bones weak. Plus, quitting smoking can help your skin look younger and reduce the chances that you will have problems with sex.  Quitting smoking will improve your health no matter how old you are, and no matter how long or how much you have smoked.  What should I do if I want to quit smoking? The letters in the word "START" can help you remember the steps to take: S = Set a quit date. T = Tell family, friends, and the people around you that you plan to quit. A = Anticipate or plan ahead for the tough times you'll face while quitting. R = Remove cigarettes and other tobacco products from your home, car, and work. T = Talk to your doctor about getting help to quit.  How can my doctor or nurse help? Your doctor or nurse can give you advice on the best way to quit. He or she can also put you in touch with counselors or other people you can call for support. Plus, your doctor or nurse can give you medicines to: ?Reduce your craving for cigarettes ?Reduce the unpleasant symptoms that happen when you stop smoking (called "withdrawal symptoms"). You can also get help from a free phone line (1-800-QUIT-NOW) or go online to [employer].  What are the symptoms of withdrawal? The symptoms include: ?Trouble sleeping ?Being irritable, anxious or  restless ?Getting frustrated or angry ?Having trouble thinking clearly  Some people who stop smoking become temporarily depressed. Some people need treatment for depression, such as counseling or antidepressant medicines. Depressed people might: ?No longer enjoy or care about doing the things they used to like to do ?Feel sad, down, hopeless, nervous, or cranky most of the day, almost every day ?Lose or gain weight ?Sleep too much or too little ?Feel tired or like they have no energy ?Feel guilty or like they are worth nothing ?Forget things or feel confused ?Move and speak more slowly than usual ?Act restless or have trouble staying still ?Think about death or suicide  If you think you might be depressed, see your doctor or nurse. Only someone trained in mental health can tell for sure if you are depressed. If you ever feel like you might hurt yourself, go straight to the nearest emergency department. Or you can call for an ambulance (in the [place] and [place], dial 9-1-1) or call your doctor or nurse right away and tell them it is an emergency. You can also reach the [place] National Suicide Prevention Lifeline at 667-666-3941 or http://hill.com/.  How do medicines help you stop smoking? Different medicines work in different ways: ?Nicotine replacement therapy eases withdrawal and reduces your body's craving for nicotine, the main drug found in cigarettes. There are different forms of nicotine replacement, including skin patches, lozenges, gum, nasal sprays, and "puffers" or  inhalers. Many can be bought without a prescription, while others might require one. ?Bupropion is a prescription medicine that reduces your desire to smoke. This medicine is sold under the brand names Zyban and Wellbutrin. It is also available in a generic version, which is cheaper than brand name medicines. ?Varenicline (brand names: Chantix, Champix) is a prescription medicine that reduces withdrawal symptoms  and cigarette cravings. If you think you'd like to take varenicline and you have a history of depression, anxiety, or heart disease, discuss this with your doctor or nurse before taking the medicine. Varenicline can also increase the effects of alcohol in some people. It's a good idea to limit drinking while you're taking it, at least until you know how it affects you.  How does counseling work? Counseling can happen during formal office visits or just over the phone. A counselor can help you: ?Figure out what triggers your smoking and what to do instead ?Overcome cravings ?Figure out what went wrong when you tried to quit before  What works best? Studies show that people have the best luck at quitting if they take medicines to help them quit and work with a Veterinary surgeon. It might also be helpful to combine nicotine replacement with one of the prescription medicines that help people quit. In some cases, it might even make sense to take bupropion and varenicline together.  What about e-cigarettes? Sometimes people wonder if using electronic cigarettes, or "e-cigarettes," might help them quit smoking. Using e-cigarettes is also called "vaping." Doctors do not recommend e-cigarettes in place of medicines and counseling. That's because e-cigarettes still contain nicotine as well as other substances that might be harmful. It's not clear how they can affect a person's health in the long term.  Will I gain weight if I quit? Yes, you might gain a few pounds. But quitting smoking will have a much more positive effect on your health than weighing a few pounds more. Plus, you can help prevent some weight gain by being more active and eating less. Taking the medicine bupropion might help control weight gain.   What else can I do to improve my chances of quitting? You can: ?Start exercising. ?Stay away from smokers and places that you associate with smoking. If people close to you smoke, ask them to quit with  you. ?Keep gum, hard candy, or something to put in your mouth handy. If you get a craving for a cigarette, try one of these instead. ?Don't give up, even if you start smoking again. It takes most people a few tries before they succeed.  What if I am pregnant and I smoke? If you are pregnant, it's really important for the health of your baby that you quit. Ask your doctor what options you have, and what is safest for your baby

## 2022-11-25 ENCOUNTER — Encounter (HOSPITAL_COMMUNITY): Payer: Medicare HMO

## 2022-12-02 ENCOUNTER — Encounter (HOSPITAL_COMMUNITY): Payer: Medicare HMO

## 2022-12-05 ENCOUNTER — Emergency Department (HOSPITAL_BASED_OUTPATIENT_CLINIC_OR_DEPARTMENT_OTHER): Payer: Medicare HMO | Admitting: Radiology

## 2022-12-05 ENCOUNTER — Encounter (HOSPITAL_BASED_OUTPATIENT_CLINIC_OR_DEPARTMENT_OTHER): Payer: Self-pay

## 2022-12-05 ENCOUNTER — Other Ambulatory Visit: Payer: Self-pay

## 2022-12-05 ENCOUNTER — Other Ambulatory Visit (HOSPITAL_BASED_OUTPATIENT_CLINIC_OR_DEPARTMENT_OTHER): Payer: Self-pay

## 2022-12-05 ENCOUNTER — Emergency Department (HOSPITAL_BASED_OUTPATIENT_CLINIC_OR_DEPARTMENT_OTHER)
Admission: EM | Admit: 2022-12-05 | Discharge: 2022-12-05 | Disposition: A | Discharge status: Home / Self Care | Payer: Medicare HMO | Attending: Emergency Medicine | Admitting: Emergency Medicine

## 2022-12-05 ENCOUNTER — Emergency Department (HOSPITAL_BASED_OUTPATIENT_CLINIC_OR_DEPARTMENT_OTHER): Payer: Medicare HMO

## 2022-12-05 DIAGNOSIS — M25551 Pain in right hip: Secondary | ICD-10-CM | POA: Diagnosis present

## 2022-12-05 MED ORDER — OXYCODONE-ACETAMINOPHEN 5-325 MG PO TABS
2.0000 | ORAL_TABLET | Freq: Once | ORAL | Status: AC
  Administered 2022-12-05: 2 via ORAL
  Filled 2022-12-05: qty 2

## 2022-12-05 NOTE — Discharge Instructions (Signed)
As discussed, workup today overall reassuring.  You do have arthritis in your hip but without evidence of fracture/dislocation.  Recommend follow-up with orthopedics in the outpatient setting for presumably resuming your joint injections.  Please do not hesitate to return to emergency department for worrisome signs and symptoms we discussed become apparent.

## 2022-12-05 NOTE — ED Notes (Signed)
DC papers reviewed. No questions or concerns. No signs of distress. Pt assisted to wheelchair and out to lobby. Appropriate measures for safety taken. 

## 2022-12-05 NOTE — ED Provider Notes (Signed)
[place] [facility] AT [facility] Provider Note   CSN: [id number] Arrival date & time: 12/05/22  1147     History  Chief Complaint  Patient presents with   Hip Pain    Dana [name] is a 62 y.o. female.   Hip Pain   62 year old female presents emergency department with complaints of right hip pain.  Patient states that she has been dealing with intermittent right hip pain for the past several months.  States that she is followed with orthopedics for her joint pain of which she has been receiving corticosteroid injections in her right hip.  States that her hip was bothering her for the past 4 to 5 days.  Denies any known trauma/injury.  States that she was going [place] while [place] the beach on [place] and noticed acute sharp right-sided hip pain that almost made her fall.  Patient notes that since then, has been using a cane to help get around with but pain has been worse.  Patient is on Eliquis for history of DVT and noticed area of bruising on her right lateral hip today.  Denies any weakness or sensory deficits in affected leg.  States the pain is in right lateral hip with some pain in her buttock region.  Past medical history significant for osteoarthritis of hip, bipolar disorder, CKD stage III, OSA, peripheral neuropathy, hypertension, hyperlipidemia, chronic back pain on opioid medication  Home Medications Prior to Admission medications   Medication Sig Start Date End Date Taking? Authorizing Provider  Acetaminophen (TYLENOL) 325 MG CAPS 3 capsules    [provider]  ALPRAZolam (XANAX) 0.5 MG tablet Take 1 tablet (0.5 mg total) by mouth 2 (two) times daily as needed for anxiety. 01/19/22 11/14/22  Corie Chiquito, PMHNP  amLODipine (NORVASC) 2.5 MG tablet Take 1 tablet (2.5 mg total) by mouth daily. 09/20/22   Rollene Rotunda, MD  apixaban (ELIQUIS) 2.5 MG TABS tablet Take 1 tablet (2.5 mg total) by mouth 2 (two) times daily. 10/02/22   Briant Cedar, PA-C  cyclobenzaprine (FLEXERIL) 10 MG tablet Take 10 mg by mouth 3 (three) times daily as needed for muscle spasms. 08/24/22   [provider]  divalproex (DEPAKOTE ER) 250 MG 24 hr tablet Take 1 tablet (250 mg total) by mouth daily. Take with a 500 mg tablet to equal total dose of 750 mg 10/24/22 01/22/23  Corie Chiquito, PMHNP  divalproex (DEPAKOTE ER) 500 MG 24 hr tablet Take 1 tablet (500 mg total) by mouth at bedtime. Take with a 250 mg tablet to equal total dose of 750 mg 10/24/22 01/22/23  Corie Chiquito, PMHNP  esomeprazole (NEXIUM) 40 MG capsule Take 40 mg by mouth 2 (two) times daily before a meal. 09/08/19   [provider]  fluticasone (FLONASE) 50 MCG/ACT nasal spray Place 1 spray into the nose daily. 08/09/22   [provider]  furosemide (LASIX) 20 MG tablet Take 20 mg by mouth every other day.    [provider]  gabapentin (NEURONTIN) 300 MG capsule Take 300 mg by mouth 3 (three) times daily. 03/30/22   [provider]  metoprolol tartrate (LOPRESSOR) 25 MG tablet Take 1 tablet (25 mg total) by mouth 2 (two) times daily. 07/22/22   Rollene Rotunda, MD  ondansetron (ZOFRAN) 4 MG tablet as needed. 01/30/20   [provider]  oxyCODONE (OXY IR/ROXICODONE) 5 MG immediate release tablet Take 5 mg by mouth every 12 (twelve) hours as needed.  [provider]  rizatriptan (MAXALT) 10 MG tablet Take by mouth. 01/20/21   [provider]  rosuvastatin (CRESTOR) 20 MG tablet Take 1 tablet (20 mg total) by mouth daily. 09/20/22   Rollene Rotunda, MD  tamsulosin (FLOMAX) 0.4 MG CAPS capsule Take 0.4 mg by mouth daily. 07/18/21   [provider]      Allergies    Anesthetics, amide; Other; Oxybutynin; Hyoscyamine; and Codeine    Review of Systems   Review of Systems  All other systems reviewed and are negative.   Physical Exam Updated Vital Signs BP 110/85   Pulse 71   Temp 98.3 F (36.8 C) (Oral)   Resp 16    Ht 5\' 6"  (1.676 m)   Wt 111.1 kg   SpO2 98%   BMI 39.54 kg/m  Physical Exam Vitals and nursing note reviewed.  Constitutional:      General: She is not in acute distress.    Appearance: She is well-developed.  HENT:     Head: Normocephalic and atraumatic.  Eyes:     Conjunctiva/sclera: Conjunctivae normal.  Cardiovascular:     Rate and Rhythm: Normal rate and regular rhythm.     Heart sounds: No murmur heard. Pulmonary:     Effort: Pulmonary effort is normal. No respiratory distress.     Breath sounds: Normal breath sounds.  Abdominal:     Palpations: Abdomen is soft.     Tenderness: There is no abdominal tenderness.  Musculoskeletal:        General: No swelling.     Cervical back: Neck supple.     Comments: Tender to palpation of lateral aspect of right hip.  area of ecchymosis approximately 4 to 5 cm in diameter on right lateral hip.  Pain with range of motion of hip.  Patient able to bear weight on affected leg but with pain.  Full range of motion of knee, ankle, digits bilaterally.  Pedal pulses 2+ bilaterally.  No obvious overlying erythema, palpable fluctuance/induration.  Skin:    General: Skin is warm and dry.     Capillary Refill: Capillary refill takes less than 2 seconds.  Neurological:     Mental Status: She is alert.  Psychiatric:        Mood and Affect: Mood normal.     ED Results / Procedures / Treatments   Labs (all labs ordered are listed, but only abnormal results are displayed) Labs Reviewed - No data to display  EKG None  Radiology CT Hip Right Wo Contrast  Result Date: 12/05/2022 CLINICAL DATA:  Fall, right hip pain.  Fracture suspected. EXAM: CT OF THE RIGHT HIP WITHOUT CONTRAST TECHNIQUE: Multidetector CT imaging of the right hip was performed according to the standard protocol. Multiplanar CT image reconstructions were also generated. RADIATION DOSE REDUCTION: This exam was performed according to the departmental dose-optimization program which  includes automated exposure control, adjustment of the mA and/or kV according to patient size and/or use of iterative reconstruction technique. COMPARISON:  None Available. FINDINGS: Bones/Joint/Cartilage No evidence of fracture or dislocation. Mild superolateral hip joint space narrowing with marginal spurring. No appreciable joint effusion. Pubic symphysis is intact. Ligaments Suboptimally assessed by CT. Muscles and Tendons Muscles are normal in bulk. No evidence of intramuscular hematoma or tendon tear. Soft tissues Skin and subcutaneous soft tissues are within normal limits. IMPRESSION: 1. No evidence of fracture or dislocation. 2. Mild degenerative changes of the right hip. 3. Muscles and subcutaneous soft tissues are within normal limits.  Electronically Signed   By: Larose Hires D.O.   On: 12/05/2022 15:43   DG Hip Unilat  With Pelvis 2-3 Views Right  Result Date: 12/05/2022 CLINICAL DATA:  Pain after fall EXAM: DG HIP (WITH OR WITHOUT PELVIS) 3V RIGHT COMPARISON:  None Available. FINDINGS: No fracture or dislocation. Preserved bone mineralization. Mild osteophyte formation joint space loss of the right hip. Degenerative changes of the visualized portions of the lumbar spine. Left hip arthroplasty seen with Press-Fit components. These incompletely included in the imaging field. IMPRESSION: Mild degenerative changes. Electronically Signed   By: Karen Kays M.D.   On: 12/05/2022 14:03    Procedures Procedures    Medications Ordered in ED Medications  oxyCODONE-acetaminophen (PERCOCET/ROXICET) 5-325 MG per tablet 2 tablet (2 tablets Oral Given 12/05/22 1553)    ED Course/ Medical Decision Making/ A&P                             Medical Decision Making Amount and/or Complexity of Data Reviewed Radiology: ordered.  Risk Prescription drug management.   This patient presents to the ED for concern of hip pain, this involves an extensive number of treatment options, and is a complaint that  carries with it a high risk of complications and morbidity.  The differential diagnosis includes fracture, dislocation, strain/sprain, septic arthritis, osteoarthritis, ligament/tendinous injury, neurovascular compromise   Co morbidities that complicate the patient evaluation  See HPI   Additional history obtained:  Additional history obtained from EMR External records from outside source obtained and reviewed including hospital records   Lab Tests:  N/ad   Imaging Studies ordered:  I ordered imaging studies including pelvis with right hip x-ray, CT right hip I independently visualized and interpreted imaging which showed  Pelvis with right hip x-ray: Mild degenerative changes CT right hip: No acute osseous abnormality.  Mild degenerative changes I agree with the radiologist interpretation   Cardiac Monitoring: / EKG:  The patient was maintained on a cardiac monitor.  I personally viewed and interpreted the cardiac monitored which showed an underlying rhythm of: Sinus rhythm   Consultations Obtained:  N/a   Problem List / ED Course / Critical interventions / Medication management  Right hip pain I ordered medication including oxycodone   Reevaluation of the patient after these medicines showed that the patient improved I have reviewed the patients home medicines and have made adjustments as needed   Social Determinants of Health:  Former cigarette use.  Denies illicit drug use.   Test / Admission - Considered:  Right hip pain Vitals signs within normal range and stable throughout visit. Imaging studies significant for: See above 62 year old female presents emergency department with complaints of right-sided hip pain for the past 4 to 5 days.  Patient with known osteoarthritis in affected hip receiving corticosteroid injections per patient by orthopedics in the outpatient setting.  Patient with worsening of pain when she was walking down steps yesterday.  Given  concern for possible underlying fracture, x-ray and CT imaging was pursued which was negative for any acute abnormality.  Patient is with small hematoma noted laterally on affected leg suspicion for muscular injury during incident occurring yesterday along with exacerbation of underlying osteoarthritis.  Patient with history of CKD so will avoid nonsteroidal anti-inflammatory medications as well as on chronic oxycodone for back pain till avoid additional narcotics at this time.  Patient has upcoming appointment with orthopedics next week so recommend continued at home  medication regimen, rest, ice, elevation of affected leg as well as following up with orthopedics as scheduled.  Patient without clinical evidence of secondary infectious process.  No lower extremity swelling consistent with DVT.  Patient with intact pulses so low suspicion for ischemic limb.  Compartment soft and supple low suspicion for compartment syndrome.  Treatment plan discussed at length with patient and she acknowledged understanding was agreeable to said plan.  Patient overall well-appearing, febrile no acute distress, ambulatory independent of assistance. Worrisome signs and symptoms were discussed with the patient, and the patient acknowledged understanding to return to the ED if noticed. Patient was stable upon discharge.          Final Clinical Impression(s) / ED Diagnoses Final diagnoses:  Right hip pain    Rx / DC Orders ED Discharge Orders     None         Peter Garter, [place] 12/05/22 1611    Terald Sleeper, MD 12/07/22 1425

## 2022-12-05 NOTE — ED Notes (Signed)
Back from [facility]

## 2022-12-05 NOTE — ED Triage Notes (Signed)
Patient here POV from Home.  Endorses Pain to Right Hip that began Sunday. States she almost fell Monday and pain has worsened since. Noted bruising as well last PM to Right Hip and hardening.   NAD Noted during Triage. A&Ox4. GCS 15. Ambulatory with Cane.

## 2023-01-17 ENCOUNTER — Encounter: Payer: Self-pay | Admitting: *Deleted

## 2023-01-25 ENCOUNTER — Other Ambulatory Visit: Payer: Self-pay

## 2023-01-25 DIAGNOSIS — F319 Bipolar disorder, unspecified: Secondary | ICD-10-CM

## 2023-01-25 MED ORDER — DIVALPROEX SODIUM ER 250 MG PO TB24: 250.0000 mg | ORAL_TABLET | Freq: Every day | ORAL | 1 refills | Status: DC

## 2023-01-25 MED ORDER — DIVALPROEX SODIUM ER 500 MG PO TB24
500.0000 mg | ORAL_TABLET | Freq: Every day | ORAL | 1 refills | Status: DC
Start: 2023-01-25 — End: 2023-04-24

## 2023-02-24 ENCOUNTER — Ambulatory Visit: Payer: Medicare HMO | Admitting: Psychiatry

## 2023-03-14 LAB — LIPID PANEL
Chol/HDL Ratio: 2.6 {ratio} (ref 0.0–4.4)
Cholesterol, Total: 166 mg/dL (ref 100–199)
HDL: 64 mg/dL (ref >39)
LDL Chol Calc (NIH): 83 mg/dL (ref 0–99)
Triglycerides: 103 mg/dL (ref 0–149)
VLDL Cholesterol Cal: 19 mg/dL (ref 5–40)

## 2023-03-21 ENCOUNTER — Telehealth: Payer: Self-pay | Admitting: Cardiology

## 2023-03-21 MED ORDER — ROSUVASTATIN CALCIUM 40 MG PO TABS: 40.0000 mg | ORAL_TABLET | Freq: Every day | ORAL | 3 refills | Status: DC

## 2023-03-21 NOTE — Telephone Encounter (Signed)
Pt stated she was told a new prescription for Crestor 40mg  would be called in to her pharmacy due to the recent increase from 20mg  but when she called [facility] they hadn't received it. Pt is requesting a callback regarding this medication. Please advise

## 2023-03-21 NOTE — Telephone Encounter (Signed)
Refill for Rosuvastatin 40 mg sent to pharmacy per lab result message:      Lipids are still not at target.  Please ask her to increase the Crestor to 40 mg po daily and I will repeat a lipid profile in 3 months.  Call Ms. Donnie Aho with the results and send results to Aliene Beams, MD

## 2023-03-22 ENCOUNTER — Telehealth: Payer: Self-pay | Admitting: *Deleted

## 2023-03-22 DIAGNOSIS — E782 Mixed hyperlipidemia: Secondary | ICD-10-CM

## 2023-03-22 NOTE — Telephone Encounter (Signed)
Pt has reviewed results via my chart  New script sent to the pharmacy  Lab orders mailed to the pt

## 2023-03-22 NOTE — Telephone Encounter (Signed)
-----   Message from Rollene Rotunda sent at 03/19/2023  9:20 PM EDT ----- Lipids are still not at target.  Please ask her to increase the Crestor to 40 mg po daily and I will repeat a lipid profile in 3 months.  Call Ms. Donnie Aho with the results and send results to Aliene Beams, MD

## 2023-03-23 ENCOUNTER — Ambulatory Visit: Payer: Medicare HMO | Admitting: Psychiatry

## 2023-03-24 ENCOUNTER — Ambulatory Visit: Payer: Medicare HMO | Admitting: Psychiatry

## 2023-03-31 ENCOUNTER — Other Ambulatory Visit: Payer: Self-pay | Admitting: Hematology and Oncology

## 2023-03-31 ENCOUNTER — Other Ambulatory Visit: Payer: Self-pay

## 2023-03-31 ENCOUNTER — Inpatient Hospital Stay (HOSPITAL_BASED_OUTPATIENT_CLINIC_OR_DEPARTMENT_OTHER): Payer: Medicare HMO | Admitting: Hematology and Oncology

## 2023-03-31 ENCOUNTER — Inpatient Hospital Stay: Payer: Medicare HMO | Attending: Hematology and Oncology

## 2023-03-31 VITALS — BP 131/72 | HR 63 | Temp 98.5°F | Resp 13 | Wt 255.4 lb

## 2023-03-31 DIAGNOSIS — I82599 Chronic embolism and thrombosis of other specified deep vein of unspecified lower extremity: Secondary | ICD-10-CM

## 2023-03-31 DIAGNOSIS — N183 Chronic kidney disease, stage 3 unspecified: Secondary | ICD-10-CM | POA: Diagnosis not present

## 2023-03-31 DIAGNOSIS — Z86718 Personal history of other venous thrombosis and embolism: Secondary | ICD-10-CM | POA: Insufficient documentation

## 2023-03-31 DIAGNOSIS — Z7901 Long term (current) use of anticoagulants: Secondary | ICD-10-CM | POA: Insufficient documentation

## 2023-03-31 LAB — CBC WITH DIFFERENTIAL (CANCER CENTER ONLY)
Abs Immature Granulocytes: 0.02 10*3/uL (ref 0.00–0.07)
Basophils Absolute: 0.1 10*3/uL (ref 0.0–0.1)
Basophils Relative: 1 %
Eosinophils Absolute: 0.1 10*3/uL (ref 0.0–0.5)
Eosinophils Relative: 1 %
HCT: 40.4 % (ref 36.0–46.0)
Hemoglobin: 13.5 g/dL (ref 12.0–15.0)
Immature Granulocytes: 0 %
Lymphocytes Relative: 38 %
Lymphs Abs: 2.3 10*3/uL (ref 0.7–4.0)
MCH: 30 pg (ref 26.0–34.0)
MCHC: 33.4 g/dL (ref 30.0–36.0)
MCV: 89.8 fL (ref 80.0–100.0)
Monocytes Absolute: 0.5 10*3/uL (ref 0.1–1.0)
Monocytes Relative: 8 %
Neutro Abs: 3 10*3/uL (ref 1.7–7.7)
Neutrophils Relative %: 52 %
Platelet Count: 178 10*3/uL (ref 150–400)
RBC: 4.5 MIL/uL (ref 3.87–5.11)
RDW: 13.1 % (ref 11.5–15.5)
WBC Count: 5.9 10*3/uL (ref 4.0–10.5)
nRBC: 0 % (ref 0.0–0.2)

## 2023-03-31 LAB — CMP (CANCER CENTER ONLY)
ALT: 11 U/L (ref 0–44)
AST: 11 U/L — ABNORMAL LOW (ref 15–41)
Albumin: 4.1 g/dL (ref 3.5–5.0)
Alkaline Phosphatase: 65 U/L (ref 38–126)
Anion gap: 7 (ref 5–15)
BUN: 21 mg/dL — ABNORMAL HIGH (ref 6–20)
CO2: 25 mmol/L (ref 22–32)
Calcium: 9.7 mg/dL (ref 8.9–10.3)
Chloride: 110 mmol/L (ref 98–111)
Creatinine: 2.01 mg/dL — ABNORMAL HIGH (ref 0.44–1.00)
GFR, Estimated: 28 mL/min — ABNORMAL LOW (ref >60)
Glucose, Bld: 91 mg/dL (ref 70–99)
Potassium: 4.1 mmol/L (ref 3.5–5.1)
Sodium: 142 mmol/L (ref 135–145)
Total Bilirubin: 0.4 mg/dL (ref 0.3–1.2)
Total Protein: 6.4 g/dL — ABNORMAL LOW (ref 6.5–8.1)

## 2023-03-31 NOTE — Progress Notes (Unsigned)
[facility] [phone]   [phone]  PROGRESS NOTE  Patient Care Team: Aliene Beams, MD as PCP - General (Family Medicine) Rollene Rotunda, MD as PCP - Cardiology (Cardiology)  Hematological/Oncological History # Recurrent DVT 11/18/2021: Doppler US: Nonocclusive DVT of intdeterminate age in left lower extremity involving common femoral vein. Treated with Eliquis for 3 months. 03/09/2022: Doppler US: Acute, occlusive DVT in right lower extremity involving the gastrocnemius veins. Restarted Eliquis therapy 09/27/2022: Establish care with [facility]  Interval History:  Dana [name] 62 y.o. female with medical history significant for recurrent DVT presents for a follow up visit. The patient's last visit was on 09/27/2022. In the interim since the last visit she has had no major changes in her health.  Dana [name] reports that she did have COVID last month as well as 2 urinary tract infections.  She reports that she is very prone to bladder infections.  She reports that she will be traveling to [place] in November to visit her son who is in the [employer].  She reports that she continues taking her Eliquis 2.5 mg twice daily and unfortunately the medication cost $71-$141 per month.  She reports that she has 2 insurances and will be filing again next time under [employer] and will reportedly be getting a medication for $47 per month.  She has not been having any episodes of bleeding but is prone to bruising.  She notes no signs or symptoms concerning for recurrent VTE such as leg pain, leg swelling, chest pain, shortness of breath.  She does have some occasional cramping in her legs which does resolve.  She otherwise denies any fevers, chills, sweats, nausea, vomiting or diarrhea.  MEDICAL HISTORY:  Past Medical History:  Diagnosis Date   Arthritis    oa   Bipolar disorder (HCC)    Chronic foot pain    Complication of anesthesia    COPD  (chronic obstructive pulmonary disease) (HCC)    DVT (deep venous thrombosis) (HCC)    GERD (gastroesophageal reflux disease)    Headache    migraines   Hyperlipidemia    Hypertension    Neuropathy    Palpitations last 1 month ago   PONV (postoperative nausea and vomiting)    Sleep apnea     SURGICAL HISTORY: Past Surgical History:  Procedure Laterality Date   ABDOMINAL HYSTERECTOMY  1998   complete   CESAREAN SECTION  1991   CHOLECYSTECTOMY  2005   FOOT SURGERY     left hip muscle tear surgery  2008   shoulder scope Left 2015   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   Procedure: LEFT TOTAL HIP ARTHROPLASTY ANTERIOR APPROACH;  Surgeon: Ollen Gross, MD;  Location: [facility];  Service: Orthopedics;  Laterality: Left;   TUBAL LIGATION  1992    SOCIAL HISTORY: Social History   Socioeconomic History   Marital status: Widowed    Spouse name: Not on file   Number of children: 2   Years of education: Not on file   Highest education level: High school graduate  Occupational History    Comment: disabled  Tobacco Use   Smoking status: Former    Current packs/day: 0.00    Average packs/day: 1 pack/day for 34.0 years (34.0 ttl pk-yrs)    Types: Cigarettes, E-cigarettes    Start date: 06/06/1977    Quit date: 06/07/2011    Years since quitting: 11.8   Smokeless tobacco: Never  Vaping Use  Vaping status: Every Day   Start date: 02/04/2022   Substances: Nicotine, Flavoring  Substance and Sexual Activity   Alcohol use: No   Drug use: No   Sexual activity: Not on file  Other Topics Concern   Not on file  Social History Narrative   Lives alone   Social Determinants of Health   Financial Resource Strain: Low Risk  (09/29/2021)   Received from [facility], [facility]   Overall Financial Resource Strain (CARDIA)    Difficulty of Paying Living Expenses: Not hard at all  Food Insecurity: No Food Insecurity (09/27/2022)   Hunger Vital Sign    Worried About Running Out of Food  in the Last Year: Never true    Ran Out of Food in the Last Year: Never true  Transportation Needs: No Transportation Needs (09/27/2022)   PRAPARE - Administrator, Civil Service (Medical): No    Lack of Transportation (Non-Medical): No  Physical Activity: Inactive (09/29/2021)   Received from [facility], [facility]   Exercise Vital Sign    Days of Exercise per Week: 0 days    Minutes of Exercise per Session: 0 min  Stress: No Stress Concern Present (09/29/2021)   Received from [facility], [facility] - Occupational Stress Questionnaire    Feeling of Stress : Not at all  Social Connections: Unknown (10/05/2022)   Received from [facility], [facility]   Social Network    Social Network: Not on file  Intimate Partner Violence: Unknown (10/05/2022)   Received from [facility], [facility]   HITS    Physically Hurt: Not on file    Insult or Talk Down To: Not on file    Threaten Physical Harm: Not on file    Scream or Curse: Not on file    FAMILY HISTORY: Family History  Problem Relation Age of Onset   Mood Disorder Mother    Colon cancer Mother    Pulmonary fibrosis Father    Idiopathic pulmonary fibrosis Father        worked in a mill/asbestos exp   Pulmonary fibrosis Sister    Bipolar disorder Brother    Bipolar disorder Son     ALLERGIES:  is allergic to anesthetics, amide; other; oxybutynin; hyoscyamine; and codeine.  MEDICATIONS:  Current Outpatient Medications  Medication Sig Dispense Refill   Acetaminophen (TYLENOL) 325 MG CAPS 3 capsules     ALPRAZolam (XANAX) 0.5 MG tablet Take 1 tablet (0.5 mg total) by mouth 2 (two) times daily as needed for anxiety. 60 tablet 5   apixaban (ELIQUIS) 2.5 MG TABS tablet Take 1 tablet (2.5 mg total) by mouth 2 (two) times daily. 60 tablet 6   cyclobenzaprine (FLEXERIL) 10 MG tablet Take 10 mg by mouth 3 (three) times daily as needed for muscle spasms.      divalproex (DEPAKOTE ER) 250 MG 24 hr tablet Take 1 tablet (250 mg total) by mouth daily. Take with a 500 mg tablet to equal total dose of 750 mg 90 tablet 1   divalproex (DEPAKOTE ER) 500 MG 24 hr tablet Take 1 tablet (500 mg total) by mouth at bedtime. Take with a 250 mg tablet to equal total dose of 750 mg 90 tablet 1   esomeprazole (NEXIUM) 40 MG capsule Take 40 mg by mouth 2 (two) times daily before a meal.     fluticasone (FLONASE) 50 MCG/ACT nasal spray Place 1 spray into the nose  daily.     furosemide (LASIX) 20 MG tablet Take 20 mg by mouth every other day.     gabapentin (NEURONTIN) 300 MG capsule Take 300 mg by mouth 3 (three) times daily.     metoprolol tartrate (LOPRESSOR) 25 MG tablet Take 1 tablet (25 mg total) by mouth 2 (two) times daily. 180 tablet 3   ondansetron (ZOFRAN) 4 MG tablet as needed.     oxyCODONE (OXY IR/ROXICODONE) 5 MG immediate release tablet Take 5 mg by mouth every 12 (twelve) hours as needed.     rizatriptan (MAXALT) 10 MG tablet Take by mouth.     rosuvastatin (CRESTOR) 40 MG tablet Take 1 tablet (40 mg total) by mouth daily. 90 tablet 3   tamsulosin (FLOMAX) 0.4 MG CAPS capsule Take 0.4 mg by mouth daily.     No current facility-administered medications for this visit.    REVIEW OF SYSTEMS:   Constitutional: ( - ) fevers, ( - )  chills , ( - ) night sweats Eyes: ( - ) blurriness of vision, ( - ) double vision, ( - ) watery eyes Ears, nose, mouth, throat, and face: ( - ) mucositis, ( - ) sore throat Respiratory: ( - ) cough, ( - ) dyspnea, ( - ) wheezes Cardiovascular: ( - ) palpitation, ( - ) chest discomfort, ( - ) lower extremity swelling Gastrointestinal:  ( - ) nausea, ( - ) heartburn, ( - ) change in bowel habits Skin: ( - ) abnormal skin rashes Lymphatics: ( - ) new lymphadenopathy, ( - ) easy bruising Neurological: ( - ) numbness, ( - ) tingling, ( - ) new weaknesses Behavioral/Psych: ( - ) mood change, ( - ) new changes  All other systems were  reviewed with the patient and are negative.  PHYSICAL EXAMINATION:  Vitals:   03/31/23 1010  BP: 131/72  Pulse: 63  Resp: 13  Temp: 98.5 F (36.9 C)  SpO2: 97%   Filed Weights   03/31/23 1010  Weight: 255 lb 6.4 oz (115.8 kg)    GENERAL: Well-appearing middle-aged female, alert, no distress and comfortable SKIN: skin color, texture, turgor are normal, no rashes or significant lesions EYES: conjunctiva are pink and non-injected, sclera clear LUNGS: clear to auscultation and percussion with normal breathing effort HEART: regular rate & rhythm and no murmurs and no lower extremity edema Musculoskeletal: no cyanosis of digits and no clubbing  PSYCH: alert & oriented x 3, fluent speech NEURO: no focal motor/sensory deficits  LABORATORY DATA:  I have reviewed the data as listed    Latest Ref Rng & Units 03/31/2023    9:24 AM 09/27/2022   10:21 AM 06/23/2022   12:39 PM  CBC  WBC 4.0 - 10.5 K/uL 5.9  7.3  6.6   Hemoglobin 12.0 - 15.0 g/dL 30.8  65.7  84.6   Hematocrit 36.0 - 46.0 % 40.4  43.1  43.0   Platelets 150 - 400 K/uL 178  169  199        Latest Ref Rng & Units 03/31/2023    9:24 AM 09/27/2022   10:21 AM 07/27/2022    9:41 AM  CMP  Glucose 70 - 99 mg/dL 91  82  89   BUN 6 - 20 mg/dL 21  21  21    Creatinine 0.44 - 1.00 mg/dL 9.62  9.52  8.41   Sodium 135 - 145 mmol/L 142  145  144   Potassium 3.5 - 5.1 mmol/L 4.1  4.4  4.7   Chloride 98 - 111 mmol/L 110  111  108   CO2 22 - 32 mmol/L 25  27  19    Calcium 8.9 - 10.3 mg/dL 9.7  16.1  9.6   Total Protein 6.5 - 8.1 g/dL 6.4  6.7  6.1   Total Bilirubin 0.3 - 1.2 mg/dL 0.4  0.5  0.3   Alkaline Phos 38 - 126 U/L 65  56  70   AST 15 - 41 U/L 11  14  17    ALT 0 - 44 U/L 11  17  12     RADIOGRAPHIC STUDIES: No results found.  ASSESSMENT & PLAN Dana [name] 62 y.o. female with medical history significant for recurrent DVT presents for a follow up visit.   We reviewed risk factors that can provoke a venous  thromboembolism (VTE) including prolonged travel/immobility, surgery (particular abdominal or orthropedic), trauma,  and pregnancy/ estrogen containing birth control. After a detailed history and review of the records there is no clear provoking factor for this patient's VTE.  Patients with unprovoked VTEs have up to 25% recurrence after 5 years and 36% at 10 years, with 4% of these clots being fatal ([phone]). Therefore the formal recommendation for unprovoked VTE's is lifelong anticoagulation, as the cause may not be transient or reversible. We recommend 6 months or full strength anticoagulation with a re-evaluation after that time.  The patient's will then have a choice of maintenance dose DOAC (preferred, recommended), 81mg  ASA PO daily (non-preferred), or no further anticoagulation (not recommended).    #Unprovoked DVT --First episode occurred on 11/18/2021. Nonocclusive DVT of intdeterminate age in left lower extremity involving common femoral vein. Felt to be unprovoked. Treated with Eliquis for 3 months. --Second episode occurred on 03/09/2022.Acute, occlusive DVT in right lower extremity involving the gastrocnemius veins. Felt to be unprovoked. Restarted Eliquis therapy --Findings at this time are consistent with a unprovoked VTE PLAN: --Recommend indefinite anticoagulation. Patient is currently on eliquis maintenance dose of 2.5 mg BID.  --patient denies any bleeding, bruising, or dark stools on this medication. It is well tolerated. No difficulties accessing/affording the medication --labs today show white blood cell 5.9, hemoglobin 13.5, MCV 89.8, and platelets of 178 --RTC in 6 months' time with strict return precautions for overt signs of bleeding.    No orders of the defined types were placed in this encounter.   All questions were answered. The patient knows to call the clinic with any problems, questions or concerns.  A total of more than 30 minutes were spent on this  encounter with face-to-face time and non-face-to-face time, including preparing to see the patient, ordering tests and/or medications, counseling the patient and coordination of care as outlined above.   Ulysees Barns, MD Department of Hematology/Oncology [facility] at [facility] Phone: [phone] Pager: [phone] Email: [email or web address]  04/02/2023 6:26 PM

## 2023-04-08 ENCOUNTER — Other Ambulatory Visit: Payer: Self-pay | Admitting: Physician Assistant

## 2023-04-10 NOTE — Progress Notes (Deleted)
Cardiology Office Note    Date:  04/10/2023  ID:  Dana [name], DOB [birth year], MRN [id number] PCP:  Aliene Beams, MD  Cardiologist:  Rollene Rotunda, MD  Electrophysiologist:  None   Chief Complaint: Follow up for chest discomfort   History of Present Illness: .    Dana [name] is a 62 y.o. female with visit-pertinent history of atypical chest pain, palpitations, hypertension, CKD stage IIIb, recurrent DVTs now on Eliquis 2.5 mg twice daily.   First evaluated by Dr. Antoine Poche on 06/17/2022 in setting of chest discomfort and palpitations.  She had previously been seen at [facility], she wore a 3-day monitor which showed predominantly normal sinus rhythm, she had a low supraventricular and ventricular ectopic beat burden.  There was very rare supraventricular tachycardia lasting only 5 beats.  She had an echocardiogram that demonstrated well-preserved ejection fraction with no valvular abnormalities.  She presented to the office due to heart racing and elevations in her blood pressure, in the 150s over 90s.  She had previously been advised by her nephrologist to start amlodipine 10 mg daily.  She noted her chest pain occurred above her left breast and below the breast, described as sharp and could be constant lasting for several days, describes a 4 out of 10 and did not hurt with a deep breath.  She noted that these were similar symptoms prior to her negative nuclear study in June 2023. Given atypical features of chest discomfort and recent negative nuclear study further workup was not pursued. She wore a  30 day cardiac monitor in 07/2022 that showed her predominant rhythm as sinus rhythm, she had rare atrial ectopy, her triggered events were during sinus rhythm.   Today she presents for follow up. She reports that she   Chest discomfort: Noted to have coronary calcium and aortic atherosclerosis noted on a previous CT. She had an MPI at [facility] on 11/11/2021 that was a low risk study, she had no ST  changes to suggest ischemia and no ischemia by perfusion imaging. Today she reports   Palpitations:  Hypertension: Blood pressure today  On amlodipine ?mg daily.  CKD stage IIIb: Last creatinine 2.01 on 03/31/23.  Follows with nephrology   History of recurrent DVT: On Eliquis 2.5 mg twice daily, followed by hematology and oncology.   Dyslipidemia: Per Dr. Antoine Poche her goal LDL is 50.  Last lipid profile on 03/14/2023 indicated total cholesterol 166, triglycerides 103, HDL 64 and LDL 83. On 03/22/2023 her Crestor was increased to 40 mg daily, she will have repeat lipid profile in 3 months.  OSA?   Labwork independently reviewed: 03/31/2023: Potassium 4.1, sodium 142, creatinine 2.01, AST 11, ALT 11, hemoglobin 13.5, hematocrit 40.4  ROS: .   *** denies chest pain, shortness of breath, lower extremity edema, fatigue, palpitations, melena, hematuria, hemoptysis, diaphoresis, weakness, presyncope, syncope, orthopnea, and PND.  All other systems are reviewed and otherwise negative.  Studies Reviewed: Marland Kitchen    EKG:  EKG is ordered today, personally reviewed, demonstrating ***     CV Studies:  Cardiac Studies & Procedures         MONITORS  CARDIAC EVENT MONITOR 08/01/2022  Narrative Normal sinus rhythm Rare atrial ectopy. Complaints of chest discomfort, pressure, rapid heart rate, tired, fast heartbeat occurred during normal sinus rhythm.             Current Reported Medications:.    No outpatient medications have been marked as taking for the 04/13/23 encounter (Appointment) with Chad,  Brent General, NP.    Physical Exam:    VS:  There were no vitals taken for this visit.   Wt Readings from Last 3 Encounters:  03/31/23 255 lb 6.4 oz (115.8 kg)  12/05/22 245 lb (111.1 kg)  11/14/22 246 lb (111.6 kg)    GEN: Well nourished, well developed in no acute distress NECK: No JVD; No carotid bruits CARDIAC: ***RRR, no murmurs, rubs, gallops RESPIRATORY:  Clear to auscultation without  rales, wheezing or rhonchi  ABDOMEN: Soft, non-tender, non-distended EXTREMITIES:  No edema; No acute deformity   Asessement and Plan:.     ***     Disposition: F/u with ***  Signed, Rip Harbour, NP

## 2023-04-13 ENCOUNTER — Ambulatory Visit: Payer: Medicare HMO | Admitting: Cardiology

## 2023-04-13 DIAGNOSIS — R002 Palpitations: Secondary | ICD-10-CM

## 2023-04-13 DIAGNOSIS — N1831 Chronic kidney disease, stage 3a: Secondary | ICD-10-CM

## 2023-04-13 DIAGNOSIS — R072 Precordial pain: Secondary | ICD-10-CM

## 2023-04-13 DIAGNOSIS — E782 Mixed hyperlipidemia: Secondary | ICD-10-CM

## 2023-04-13 DIAGNOSIS — I10 Essential (primary) hypertension: Secondary | ICD-10-CM

## 2023-04-16 ENCOUNTER — Emergency Department (HOSPITAL_BASED_OUTPATIENT_CLINIC_OR_DEPARTMENT_OTHER): Payer: Medicare HMO

## 2023-04-16 ENCOUNTER — Emergency Department (HOSPITAL_BASED_OUTPATIENT_CLINIC_OR_DEPARTMENT_OTHER)
Admission: EM | Admit: 2023-04-16 | Discharge: 2023-04-16 | Disposition: A | Discharge status: Home / Self Care | Payer: Medicare HMO | Attending: Emergency Medicine | Admitting: Emergency Medicine

## 2023-04-16 ENCOUNTER — Encounter (HOSPITAL_BASED_OUTPATIENT_CLINIC_OR_DEPARTMENT_OTHER): Payer: Self-pay | Admitting: Emergency Medicine

## 2023-04-16 DIAGNOSIS — I129 Hypertensive chronic kidney disease with stage 1 through stage 4 chronic kidney disease, or unspecified chronic kidney disease: Secondary | ICD-10-CM | POA: Insufficient documentation

## 2023-04-16 DIAGNOSIS — R1031 Right lower quadrant pain: Secondary | ICD-10-CM | POA: Diagnosis present

## 2023-04-16 DIAGNOSIS — Z7901 Long term (current) use of anticoagulants: Secondary | ICD-10-CM | POA: Diagnosis not present

## 2023-04-16 DIAGNOSIS — N189 Chronic kidney disease, unspecified: Secondary | ICD-10-CM | POA: Diagnosis not present

## 2023-04-16 DIAGNOSIS — Z79899 Other long term (current) drug therapy: Secondary | ICD-10-CM | POA: Insufficient documentation

## 2023-04-16 DIAGNOSIS — N12 Tubulo-interstitial nephritis, not specified as acute or chronic: Secondary | ICD-10-CM | POA: Diagnosis not present

## 2023-04-16 LAB — URINALYSIS, ROUTINE W REFLEX MICROSCOPIC
Bilirubin Urine: NEGATIVE
Glucose, UA: NEGATIVE mg/dL
Ketones, ur: NEGATIVE mg/dL
Nitrite: NEGATIVE
Protein, ur: 100 mg/dL — AB
Specific Gravity, Urine: 1.005 (ref 1.005–1.030)
WBC, UA: >50 WBC/hpf (ref 0–5)
pH: 6.5 (ref 5.0–8.0)

## 2023-04-16 LAB — CBC
HCT: 41.5 % (ref 36.0–46.0)
Hemoglobin: 13.8 g/dL (ref 12.0–15.0)
MCH: 30 pg (ref 26.0–34.0)
MCHC: 33.3 g/dL (ref 30.0–36.0)
MCV: 90.2 fL (ref 80.0–100.0)
Platelets: 144 10*3/uL — ABNORMAL LOW (ref 150–400)
RBC: 4.6 MIL/uL (ref 3.87–5.11)
RDW: 13.4 % (ref 11.5–15.5)
WBC: 8.3 10*3/uL (ref 4.0–10.5)
nRBC: 0 % (ref 0.0–0.2)

## 2023-04-16 LAB — COMPREHENSIVE METABOLIC PANEL
ALT: 9 U/L (ref 0–44)
AST: 10 U/L — ABNORMAL LOW (ref 15–41)
Albumin: 4.2 g/dL (ref 3.5–5.0)
Alkaline Phosphatase: 57 U/L (ref 38–126)
Anion gap: 9 (ref 5–15)
BUN: 27 mg/dL — ABNORMAL HIGH (ref 6–20)
CO2: 24 mmol/L (ref 22–32)
Calcium: 10.1 mg/dL (ref 8.9–10.3)
Chloride: 108 mmol/L (ref 98–111)
Creatinine, Ser: 2.12 mg/dL — ABNORMAL HIGH (ref 0.44–1.00)
GFR, Estimated: 26 mL/min — ABNORMAL LOW (ref >60)
Glucose, Bld: 98 mg/dL (ref 70–99)
Potassium: 4.4 mmol/L (ref 3.5–5.1)
Sodium: 141 mmol/L (ref 135–145)
Total Bilirubin: 0.5 mg/dL (ref <1.2)
Total Protein: 7.1 g/dL (ref 6.5–8.1)

## 2023-04-16 LAB — LIPASE, BLOOD: Lipase: 31 U/L (ref 11–51)

## 2023-04-16 MED ORDER — HYDROMORPHONE HCL 1 MG/ML IJ SOLN
0.5000 mg | Freq: Once | INTRAMUSCULAR | Status: AC
  Administered 2023-04-16: 0.5 mg via INTRAVENOUS
  Filled 2023-04-16: qty 1

## 2023-04-16 MED ORDER — ONDANSETRON HCL 4 MG/2ML IJ SOLN
4.0000 mg | Freq: Once | INTRAMUSCULAR | Status: AC
  Administered 2023-04-16: 4 mg via INTRAVENOUS
  Filled 2023-04-16: qty 2

## 2023-04-16 MED ORDER — SODIUM CHLORIDE 0.9 % IV SOLN
1.0000 g | Freq: Once | INTRAVENOUS | Status: AC
Start: 2023-04-16 — End: 2023-04-16
  Administered 2023-04-16: 1 g via INTRAVENOUS
  Filled 2023-04-16: qty 10

## 2023-04-16 MED ORDER — CEFDINIR 300 MG PO CAPS: 300.0000 mg | ORAL_CAPSULE | Freq: Two times a day (BID) | ORAL | 0 refills | Status: DC

## 2023-04-16 NOTE — ED Provider Notes (Signed)
[place] EMERGENCY DEPARTMENT AT [facility] Provider Note   CSN: [id number] Arrival date & time: 04/16/23  0915     History  Chief Complaint  Patient presents with   Abdominal Pain    Dana [name] is a 62 y.o. female.  Patient with history of chronic kidney disease, history of hysterectomy and cholecystectomy, hyperlipidemia, hypertension --presents to the emergency department today for evaluation of worsening right-sided abdominal pain and dysuria.  Patient states that she has had similar symptoms waxing and waning over the past several weeks.  She has seen urgent care as well as her urologist and nephrologist.  They have a CT scheduled for 2 days.  Patient reports that the pain was worsening, so she presented today.  She describes right-sided flank pain that radiates down into the right lower abdomen.  She reports severe pain when she has the urge to urinate with associated dysuria and cloudy urine.  Patient was most recently on an antibiotic for UTI 2 weeks ago but was told to stop because the culture was negative.  She has had a couple of UAs which have been negative.  Patient is concerned about having a kidney stone.  At her most recent urology appointment she was given hydroxyzine for vaginal itching as well as Azo for 3 days.  Urine culture from 11/4 was negative.       Home Medications Prior to Admission medications   Medication Sig Start Date End Date Taking? Authorizing Provider  Acetaminophen (TYLENOL) 325 MG CAPS 3 capsules    [provider]  ALPRAZolam (XANAX) 0.5 MG tablet Take 1 tablet (0.5 mg total) by mouth 2 (two) times daily as needed for anxiety. 01/19/22 11/14/22  Corie Chiquito, PMHNP  cyclobenzaprine (FLEXERIL) 10 MG tablet Take 10 mg by mouth 3 (three) times daily as needed for muscle spasms. 08/24/22   [provider]  divalproex (DEPAKOTE ER) 250 MG 24 hr tablet Take 1 tablet (250 mg total) by mouth daily. Take with a 500 mg  tablet to equal total dose of 750 mg 01/25/23 07/24/23  Corie Chiquito, PMHNP  divalproex (DEPAKOTE ER) 500 MG 24 hr tablet Take 1 tablet (500 mg total) by mouth at bedtime. Take with a 250 mg tablet to equal total dose of 750 mg 01/25/23 07/24/23  Corie Chiquito, PMHNP  ELIQUIS 2.5 MG TABS tablet TAKE 1 TABLET(2.5 MG) BY MOUTH TWICE DAILY 04/10/23   Jaci Standard, MD  esomeprazole (NEXIUM) 40 MG capsule Take 40 mg by mouth 2 (two) times daily before a meal. 09/08/19   [provider]  fluticasone (FLONASE) 50 MCG/ACT nasal spray Place 1 spray into the nose daily. 08/09/22   [provider]  furosemide (LASIX) 20 MG tablet Take 20 mg by mouth every other day.    [provider]  gabapentin (NEURONTIN) 300 MG capsule Take 300 mg by mouth 3 (three) times daily. 03/30/22   [provider]  metoprolol tartrate (LOPRESSOR) 25 MG tablet Take 1 tablet (25 mg total) by mouth 2 (two) times daily. 07/22/22   Rollene Rotunda, MD  ondansetron (ZOFRAN) 4 MG tablet as needed. 01/30/20   [provider]  oxyCODONE (OXY IR/ROXICODONE) 5 MG immediate release tablet Take 5 mg by mouth every 12 (twelve) hours as needed.    [provider]  rizatriptan (MAXALT) 10 MG tablet Take by mouth. 01/20/21   [provider]  rosuvastatin (CRESTOR) 40 MG tablet Take 1 tablet (40 mg total) by mouth  daily. 03/21/23 06/19/23  Rollene Rotunda, MD  tamsulosin (FLOMAX) 0.4 MG CAPS capsule Take 0.4 mg by mouth daily. 07/18/21   [provider]      Allergies    Anesthetics, amide; Other; Oxybutynin; Hyoscyamine; and Codeine    Review of Systems   Review of Systems  Physical Exam Updated Vital Signs BP (!) 137/92 (BP Location: Right Arm)   Pulse 80   Temp (!) 100.4 F (38 C)   Resp 12   Ht 5\' 6"  (1.676 m)   Wt 113.4 kg   SpO2 99%   BMI 40.35 kg/m  Physical Exam Vitals and nursing note reviewed.  Constitutional:      General: She is not in acute  distress.    Appearance: She is well-developed.  HENT:     Head: Normocephalic and atraumatic.     Right Ear: External ear normal.     Left Ear: External ear normal.     Nose: Nose normal.  Eyes:     Conjunctiva/sclera: Conjunctivae normal.  Cardiovascular:     Rate and Rhythm: Normal rate and regular rhythm.     Heart sounds: No murmur heard. Pulmonary:     Effort: No respiratory distress.     Breath sounds: No wheezing, rhonchi or rales.  Abdominal:     Palpations: Abdomen is soft.     Tenderness: There is abdominal tenderness in the right lower quadrant and suprapubic area. There is right CVA tenderness. There is no left CVA tenderness, guarding or rebound. Negative signs include Murphy's sign and McBurney's sign.  Musculoskeletal:     Cervical back: Normal range of motion and neck supple.     Right lower leg: No edema.     Left lower leg: No edema.  Skin:    General: Skin is warm and dry.     Findings: No rash.  Neurological:     General: No focal deficit present.     Mental Status: She is alert. Mental status is at baseline.     Motor: No weakness.  Psychiatric:        Mood and Affect: Mood normal.     ED Results / Procedures / Treatments   Labs (all labs ordered are listed, but only abnormal results are displayed) Labs Reviewed  URINALYSIS, ROUTINE W REFLEX MICROSCOPIC - Abnormal; Notable for the following components:      Result Value   APPearance CLOUDY (*)    Hgb urine dipstick MODERATE (*)    Protein, ur 100 (*)    Leukocytes,Ua LARGE (*)    Bacteria, UA RARE (*)    Non Squamous Epithelial 0-5 (*)    All other components within normal limits  COMPREHENSIVE METABOLIC PANEL - Abnormal; Notable for the following components:   BUN 27 (*)    Creatinine, Ser 2.12 (*)    AST 10 (*)    GFR, Estimated 26 (*)    All other components within normal limits  CBC - Abnormal; Notable for the following components:   Platelets 144 (*)    All other components within  normal limits  URINE CULTURE  LIPASE, BLOOD    EKG None  Radiology CT ABDOMEN PELVIS WO CONTRAST  Result Date: 04/16/2023 CLINICAL DATA:  Abdominal pain. EXAM: CT ABDOMEN AND PELVIS WITHOUT CONTRAST TECHNIQUE: Multidetector CT imaging of the abdomen and pelvis was performed following the standard protocol without IV contrast. RADIATION DOSE REDUCTION: This exam was performed according to the departmental dose-optimization program which includes automated exposure  control, adjustment of the mA and/or kV according to patient size and/or use of iterative reconstruction technique. COMPARISON:  CT scan 02/18/2022 FINDINGS: Lower chest: The lung bases are clear of acute process. No pleural effusion or pulmonary lesions. The heart is normal in size. No pericardial effusion. The distal esophagus and aorta are unremarkable. Hepatobiliary: Stable scattered hepatic cysts. No worrisome hepatic lesions are identified without contrast. No intrahepatic biliary dilatation. The gallbladder is surgically absent. No common bile duct dilatation. Pancreas: Normal Spleen: Normal Adrenals/Urinary Tract: The adrenal glands are normal. Stable bilateral renal cysts not requiring any further imaging evaluation or follow-up. No renal, ureteral or bladder calculi. No perinephric interstitial changes are identified. Pyelonephritis could be missed on noncontrast CT scan. No right-sided hydronephrosis but there is right hydroureter and the ureteral wall appears thickened and there is high attenuation noted in the distal ureter. Findings could be due to infection or hemorrhage or both. Recommend correlation with urinalysis. Contrast enhanced CT scan may be helpful for further evaluation. No bladder wall thickening or bladder lesions. Stomach/Bowel: The stomach, duodenum, small bowel and colon are grossly normal without oral contrast. No inflammatory changes, mass lesions or obstructive findings. The appendix is normal. Stable colonic  diverticulosis. Vascular/Lymphatic: Stable age advanced atherosclerotic calcifications involving the aorta and iliac arteries but no aneurysm. No abdominal or pelvic lymphadenopathy. Reproductive: Surgically absent. Other: No pelvic mass or adenopathy. No free pelvic fluid collections. No inguinal mass or adenopathy. No abdominal wall hernia or subcutaneous lesions. Musculoskeletal: No significant bony findings. Artifact associated with left hip prosthesis. Stable advanced degenerative changes involving the right hip IMPRESSION: 1. No renal, ureteral or bladder calculi. 2. Right hydroureter with ureteral wall thickening and high attenuation. Findings could be due to infection or hemorrhage or both. Recommend correlation with urinalysis. Contrast enhanced CT scan may be helpful for further evaluation. 3. No hydronephrosis or obvious findings for pyelonephritis or renal abscess but these could be easily missed on noncontrast CT scan. 4. No acute abdominal/pelvic mass lesions or adenopathy. 5. Status post cholecystectomy. No biliary dilatation. 6. Stable age advanced atherosclerotic calcifications involving the aorta and iliac arteries. Aortic Atherosclerosis (ICD10-I70.0). Electronically Signed   By: Rudie Meyer M.D.   On: 04/16/2023 11:04    Procedures Procedures    Medications Ordered in ED Medications  HYDROmorphone (DILAUDID) injection 0.5 mg (0.5 mg Intravenous Given 04/16/23 1029)  ondansetron (ZOFRAN) injection 4 mg (4 mg Intravenous Given 04/16/23 1029)  cefTRIAXone (ROCEPHIN) 1 g in sodium chloride 0.9 % 100 mL IVPB (0 g Intravenous Stopped 04/16/23 1122)  HYDROmorphone (DILAUDID) injection 0.5 mg (0.5 mg Intravenous Given 04/16/23 1158)    ED Course/ Medical Decision Making/ A&P    Patient seen and examined. History obtained directly from patient.  I reviewed most recent urine testing and urology notes in care everywhere.  Labs/EKG: Personally reviewed and interpreted including CBC with  normal white blood cell count, mild thrombocytopenia otherwise unremarkable; CMP creatinine 2.12 which is near baseline with a BUN of 27, normal transaminases, normal electrolytes otherwise unremarkable; lipase normal; UA not a clean-catch but does have concerning features of infection including greater than 50 white blood cells per high-power field and white blood cell clumps.  Imaging: Ordered CT abdomen pelvis without contrast.  Medications/Fluids: Ordered: IV Dilaudid, IV Zofran.   Most recent vital signs reviewed and are as follows: BP (!) 137/92 (BP Location: Right Arm)   Pulse 80   Temp (!) 100.4 F (38 C)   Resp 12  Ht 5\' 6"  (1.676 m)   Wt 113.4 kg   SpO2 99%   BMI 40.35 kg/m   Initial impression: Clinically there is concern for right-sided pyelonephritis.  However, her pain has been going on for some time and she has had negative urine cultures and dipsticks with similar pain.  Will plan for imaging to evaluate for other potential causes and rule out ureteral stone.  2:22 PM Reassessment performed. Patient appears more comfortable.  She had 2 doses of pain medication and felt better after the second dose.  Imaging personally visualized and interpreted including: CT abdomen pelvis, agree right ureteral nephrosis, no kidney stone.  I discussed CT findings with Dr. Fredderick Phenix.  Reviewed pertinent lab work and imaging with patient at bedside. Questions answered.   Most current vital signs reviewed and are as follows: BP (!) 109/58   Pulse 69   Temp 98.6 F (37 C)   Resp 18   Ht 5\' 6"  (1.676 m)   Wt 113.4 kg   SpO2 95%   BMI 40.35 kg/m   Plan: Discharge to home.  I had a shared decision-making discussion with the patient in regards to admission versus discharge.  Overall her pain is controlled.  Temperature current 98.6.  No vomiting.  She has follow-up with her doctor tomorrow.  Patient is comfortable with trial of treatment at home, return with worsening.  Prescriptions  written for: Cefdinir.  Patient has pain and nausea medications at home.  Other home care instructions discussed: Rest, hydration  ED return instructions discussed: The patient was urged to return to the Emergency Department immediately with worsening of current symptoms, worsening abdominal pain, persistent vomiting, blood noted in stools, fever, or any other concerns. The patient verbalized understanding.   Follow-up instructions discussed: Patient encouraged to follow-up with their PCP in 2 days.                                   Medical Decision Making Amount and/or Complexity of Data Reviewed Labs: ordered. Radiology: ordered.  Risk Prescription drug management.   For this patient's complaint of abdominal pain, the following conditions were considered on the differential diagnosis: gastritis/PUD, enteritis/duodenitis, appendicitis, cholelithiasis/cholecystitis, cholangitis, pancreatitis, ruptured viscus, colitis, diverticulitis, small/large bowel obstruction, proctitis, cystitis, pyelonephritis, ureteral colic, aortic dissection, aortic aneurysm. In women, ectopic pregnancy, pelvic inflammatory disease, ovarian cysts, and tubo-ovarian abscess were also considered. Atypical chest etiologies were also considered including ACS, PE, and pneumonia.  The patient's vital signs, pertinent lab work and imaging were reviewed and interpreted as discussed in the ED course. Hospitalization was considered for further testing, treatments, or serial exams/observation. However as patient is well-appearing, has a stable exam, and reassuring studies today, I do not feel that they warrant admission at this time. This plan was discussed with the patient who verbalizes agreement and comfort with this plan and seems reliable and able to return to the Emergency Department with worsening or changing symptoms.          Final Clinical Impression(s) / ED Diagnoses Final diagnoses:  Pyelonephritis    Rx  / DC Orders ED Discharge Orders          Ordered    cefdinir (OMNICEF) 300 MG capsule  2 times daily        04/16/23 1401              Renne Crigler, PA-C 04/16/23 1423    Belfi,  Shawna Orleans, MD 04/16/23 1434

## 2023-04-16 NOTE — ED Triage Notes (Signed)
Pt states she has been having urinary itching for a few weeks. Urologist seen and Hyroxyzine Rx given and Azo. Last night, pain increased in R back and radiating to abdomen. Oxycodone taken this am.

## 2023-04-16 NOTE — Discharge Instructions (Addendum)
Please read and follow all provided instructions.  Your diagnoses today include:  1. Pyelonephritis     Tests performed today include: Blood cell counts and platelets: Normal white blood cell count Kidney and liver function tests: Your creatinine was 2.12 today Urine test to look for infection: Suggest infection in the urine CT scan with signs of inflammation in the right ureter, likely due to an infection, no kidney stones Vital signs. See below for your results today.   Medications prescribed:  Cefdinir: Antibiotic for UTI  Take any prescribed medications only as directed.  Home care instructions:  Follow any educational materials contained in this packet. Continue home with Zofran and pain medication for symptom control  Follow-up instructions: Please follow-up with your primary care provider in the next 2 days for further evaluation of your symptoms.    Return instructions:  SEEK IMMEDIATE MEDICAL ATTENTION IF: The pain does not go away or becomes severe  A temperature above 101F develops  Repeated vomiting occurs (multiple episodes)  The pain becomes localized to portions of the abdomen. The right side could possibly be appendicitis. In an adult, the left lower portion of the abdomen could be colitis or diverticulitis.  Blood is being passed in stools or vomit (bright red or black tarry stools)  You develop chest pain, difficulty breathing, dizziness or fainting, or become confused, poorly responsive, or inconsolable (young children) If you have any other emergent concerns regarding your health  Additional Information: Abdominal (belly) pain can be caused by many things. Your caregiver performed an examination and possibly ordered blood/urine tests and imaging (CT scan, x-rays, ultrasound). Many cases can be observed and treated at home after initial evaluation in the emergency department. Even though you are being discharged home, abdominal pain can be unpredictable.  Therefore, you need a repeated exam if your pain does not resolve, returns, or worsens. Most patients with abdominal pain don't have to be admitted to the hospital or have surgery, but serious problems like appendicitis and gallbladder attacks can start out as nonspecific pain. Many abdominal conditions cannot be diagnosed in one visit, so follow-up evaluations are very important.  Your vital signs today were: BP (!) 109/58   Pulse 70   Temp (!) 100.4 F (38 C)   Resp 13   Ht 5\' 6"  (1.676 m)   Wt 113.4 kg   SpO2 91%   BMI 40.35 kg/m  If your blood pressure (bp) was elevated above 135/85 this visit, please have this repeated by your doctor within one month. --------------

## 2023-04-18 LAB — URINE CULTURE: Culture: >=100000 — AB

## 2023-04-19 ENCOUNTER — Encounter: Payer: Self-pay | Admitting: Psychiatry

## 2023-04-19 ENCOUNTER — Telehealth (HOSPITAL_BASED_OUTPATIENT_CLINIC_OR_DEPARTMENT_OTHER): Payer: Self-pay

## 2023-04-19 NOTE — Telephone Encounter (Signed)
Post ED Visit - Positive Culture Follow-up  Culture report reviewed by antimicrobial stewardship pharmacist: [facility] [x]  Enos Fling, Pharm.D. []  Celedonio Miyamoto, Pharm.D., BCPS AQ-ID []  Garvin Fila, Pharm.D., BCPS []  Georgina Pillion, Pharm.D., BCPS []  [place], [address].D., BCPS, AAHIVP []  Estella Husk, Pharm.D., BCPS, AAHIVP []  Lysle Pearl, PharmD, BCPS []  Phillips Climes, PharmD, BCPS []  Agapito Games, PharmD, BCPS []  Verlan Friends, PharmD []  Mervyn Gay, PharmD, BCPS []  Vinnie Level, PharmD  [facility] []  Len Childs, PharmD []  Greer Pickerel, PharmD []  Adalberto Cole, PharmD []  Perlie Gold, Rph []  Lonell Face) Jean Rosenthal, PharmD []  Earl Many, PharmD []  Junita Push, PharmD []  Dorna Leitz, PharmD []  Terrilee Files, PharmD []  Lynann Beaver, PharmD []  Keturah Barre, PharmD []  Loralee Pacas, PharmD []  Bernadene Person, PharmD   Positive urine culture Treated with Cefdinir, organism sensitive to the same and no further patient follow-up is required at this time.  Sandria Senter 04/19/2023, 9:39 AM

## 2023-04-24 ENCOUNTER — Ambulatory Visit (INDEPENDENT_AMBULATORY_CARE_PROVIDER_SITE_OTHER): Payer: Medicare HMO | Admitting: Psychiatry

## 2023-04-24 ENCOUNTER — Encounter: Payer: Self-pay | Admitting: Psychiatry

## 2023-04-24 DIAGNOSIS — F319 Bipolar disorder, unspecified: Secondary | ICD-10-CM | POA: Diagnosis not present

## 2023-04-24 DIAGNOSIS — F419 Anxiety disorder, unspecified: Secondary | ICD-10-CM | POA: Diagnosis not present

## 2023-04-24 DIAGNOSIS — F5101 Primary insomnia: Secondary | ICD-10-CM

## 2023-04-24 MED ORDER — DIVALPROEX SODIUM ER 250 MG PO TB24
250.0000 mg | ORAL_TABLET | Freq: Every day | ORAL | 1 refills | Status: AC
Start: 2023-04-24 — End: 2024-03-28

## 2023-04-24 MED ORDER — DIVALPROEX SODIUM ER 500 MG PO TB24: 500.0000 mg | ORAL_TABLET | Freq: Every day | ORAL | 1 refills | Status: AC

## 2023-04-24 NOTE — Progress Notes (Unsigned)
Dana [name] [id number] [birth year] 62 y.o.  Subjective:   Patient ID:  Dana [name] is a 62 y.o. (DOB [birth year]) female.  Chief Complaint:  Chief Complaint  Patient presents with   Follow-up    Mood disturbance, anxiety    HPI Dana [name] presents to the office today for follow-up of Bipolar Disorder and anxiety.   Son came from [place] and stayed 17 days. She and both of her sons, daughter-in-law, and granddaughter all went on vacation. She reports that she got COVID a week after son left. She then got a UTI. She was recently diagnosed with interstitial cystitis. She has been prescribed amitriptyline and hydroxyzine. She reports taking these medication at bedtime only due to daytime somnolence.   She reports that her mood has been ok except for depression with acute medical issues. Denies any mood swings. Denies manic symptoms. Appetite has been ok. Energy and motivation have been low due to medical issues. She reports that her concentration is "ok." Denies SI.   She has been enjoying spending time with her niece. Son, Pennie Rushing, has been promoted in [employer].   She reports that she took Alprazolam prn about 2 weeks ago. Reports rarely taking Alprazolam prn and has some remaining.   Past Psychiatric Medication Trials: Lithium-has taken since age 62.  Was on 600 mg twice daily for most of this duration Carbamazepine-flulike signs and symptoms, body aches, headaches, dizziness, and itching Gabapentin Depakote- Unsteadiness, falls Latuda-headaches, itching Xanax Hydroxyzine Amitriptyline- prescribed by urologist  PHQ2-9    Flowsheet Row Office Visit from 09/27/2022 in [facility] Dept Of [place]  PHQ-2 Total Score 0      Flowsheet Row ED from 04/16/2023 in [facility] Emergency Department at [facility] ED from 12/05/2022 in [facility] Emergency Department at [facility] ED from 06/23/2022 in [facility] Emergency  Department at [facility]  C-SSRS RISK CATEGORY No Risk No Risk No Risk        Review of Systems:  Review of Systems  Genitourinary:  Positive for dysuria, flank pain and frequency.  Musculoskeletal:  Positive for back pain. Negative for gait problem.  Psychiatric/Behavioral:         Please refer to HPI    Medications: I have reviewed the patient's current medications.  Current Outpatient Medications  Medication Sig Dispense Refill   amitriptyline (ELAVIL) 25 MG tablet Take 25 mg by mouth at bedtime.     cefdinir (OMNICEF) 300 MG capsule Take 1 capsule (300 mg total) by mouth 2 (two) times daily. 20 capsule 0   gabapentin (NEURONTIN) 300 MG capsule Take 300 mg by mouth 3 (three) times daily.     hydrOXYzine (ATARAX) 25 MG tablet Take 25 mg by mouth at bedtime.     tamsulosin (FLOMAX) 0.4 MG CAPS capsule Take 0.4 mg by mouth daily.     Acetaminophen (TYLENOL) 325 MG CAPS 3 capsules     ALPRAZolam (XANAX) 0.5 MG tablet Take 1 tablet (0.5 mg total) by mouth 2 (two) times daily as needed for anxiety. 60 tablet 5   cyclobenzaprine (FLEXERIL) 10 MG tablet Take 10 mg by mouth 3 (three) times daily as needed for muscle spasms.     divalproex (DEPAKOTE ER) 250 MG 24 hr tablet Take 1 tablet (250 mg total) by mouth daily. Take with a 500 mg tablet to equal total dose of 750 mg 90 tablet 1   divalproex (DEPAKOTE ER) 500  MG 24 hr tablet Take 1 tablet (500 mg total) by mouth at bedtime. Take with a 250 mg tablet to equal total dose of 750 mg 90 tablet 1   ELIQUIS 2.5 MG TABS tablet TAKE 1 TABLET(2.5 MG) BY MOUTH TWICE DAILY 60 tablet 6   esomeprazole (NEXIUM) 40 MG capsule Take 40 mg by mouth 2 (two) times daily before a meal.     fluticasone (FLONASE) 50 MCG/ACT nasal spray Place 1 spray into the nose daily.     furosemide (LASIX) 20 MG tablet Take 20 mg by mouth every other day.     metoprolol tartrate (LOPRESSOR) 25 MG tablet Take 1 tablet (25 mg total) by mouth 2 (two) times daily. 180  tablet 3   ondansetron (ZOFRAN) 4 MG tablet as needed.     oxyCODONE (OXY IR/ROXICODONE) 5 MG immediate release tablet Take 5 mg by mouth every 12 (twelve) hours as needed.     rizatriptan (MAXALT) 10 MG tablet Take by mouth.     rosuvastatin (CRESTOR) 40 MG tablet Take 1 tablet (40 mg total) by mouth daily. 90 tablet 3   No current facility-administered medications for this visit.    Medication Side Effects: None  Allergies:  Allergies  Allergen Reactions   Anesthetics, Amide Nausea And Vomiting   Other    Oxybutynin Other (See Comments)   Hyoscyamine    Codeine Nausea And Vomiting and Nausea Only    Past Medical History:  Diagnosis Date   Arthritis    oa   Bipolar disorder (HCC)    Chronic foot pain    Complication of anesthesia    COPD (chronic obstructive pulmonary disease) (HCC)    DVT (deep venous thrombosis) (HCC)    GERD (gastroesophageal reflux disease)    Headache    migraines   Hyperlipidemia    Hypertension    Neuropathy    Palpitations last 1 month ago   PONV (postoperative nausea and vomiting)    Sleep apnea     Past Medical History, Surgical history, Social history, and Family history were reviewed and updated as appropriate.   Please see review of systems for further details on the patient's review from today.   Objective:   Physical Exam:  There were no vitals taken for this visit.  Physical Exam  Lab Review:     Component Value Date/Time   NA 141 04/16/2023 0938   NA 144 07/27/2022 0941   K 4.4 04/16/2023 0938   CL 108 04/16/2023 0938   CO2 24 04/16/2023 0938   GLUCOSE 98 04/16/2023 0938   BUN 27 (H) 04/16/2023 0938   BUN 21 07/27/2022 0941   CREATININE 2.12 (H) 04/16/2023 0938   CREATININE 2.01 (H) 03/31/2023 0924   CREATININE 1.57 (H) 02/20/2019 0934   CALCIUM 10.1 04/16/2023 0938   PROT 7.1 04/16/2023 0938   PROT 6.1 07/27/2022 0941   ALBUMIN 4.2 04/16/2023 0938   ALBUMIN 4.2 07/27/2022 0941   AST 10 (L) 04/16/2023 0938    AST 11 (L) 03/31/2023 0924   ALT 9 04/16/2023 0938   ALT 11 03/31/2023 0924   ALKPHOS 57 04/16/2023 0938   BILITOT 0.5 04/16/2023 0938   BILITOT 0.4 03/31/2023 0924   GFRNONAA 26 (L) 04/16/2023 0938   GFRNONAA 28 (L) 03/31/2023 0924   GFRAA >60 03/19/2015 0533       Component Value Date/Time   WBC 8.3 04/16/2023 0938   RBC 4.60 04/16/2023 0938   HGB 13.8 04/16/2023 1027  HGB 13.5 03/31/2023 0924   HCT 41.5 04/16/2023 0938   PLT 144 (L) 04/16/2023 0938   PLT 178 03/31/2023 0924   MCV 90.2 04/16/2023 0938   MCH 30.0 04/16/2023 0938   MCHC 33.3 04/16/2023 0938   RDW 13.4 04/16/2023 0938   LYMPHSABS 2.3 03/31/2023 0924   MONOABS 0.5 03/31/2023 0924   EOSABS 0.1 03/31/2023 0924   BASOSABS 0.1 03/31/2023 0924    Lithium Lvl  Date Value Ref Range Status  02/20/2019 0.8 0.6 - 1.2 mmol/L Final     Lab Results  Component Value Date   VALPROATE 63 07/27/2022     .res Assessment: Plan:   Encouraged pt to follow-up with urologist regarding urinary symptoms.    There are no diagnoses linked to this encounter.   Please see After Visit Summary for patient specific instructions.  Future Appointments  Date Time Provider Department Center  06/15/2023  9:00 AM Rollene Rotunda, MD [facility] None  09/29/2023 10:30 AM [facility] None  09/29/2023 11:00 AM Jaci Standard, MD [facility] None    No orders of the defined types were placed in this encounter.   -------------------------------

## 2023-04-25 ENCOUNTER — Other Ambulatory Visit: Payer: Self-pay | Admitting: Cardiology

## 2023-04-25 DIAGNOSIS — R072 Precordial pain: Secondary | ICD-10-CM

## 2023-04-25 DIAGNOSIS — R002 Palpitations: Secondary | ICD-10-CM

## 2023-05-14 ENCOUNTER — Other Ambulatory Visit: Payer: Self-pay

## 2023-05-14 ENCOUNTER — Emergency Department (HOSPITAL_BASED_OUTPATIENT_CLINIC_OR_DEPARTMENT_OTHER): Payer: Medicare HMO | Admitting: Radiology

## 2023-05-14 ENCOUNTER — Emergency Department (HOSPITAL_BASED_OUTPATIENT_CLINIC_OR_DEPARTMENT_OTHER)
Admission: EM | Admit: 2023-05-14 | Discharge: 2023-05-14 | Disposition: A | Discharge status: Home / Self Care | Payer: Medicare HMO | Attending: Emergency Medicine | Admitting: Emergency Medicine

## 2023-05-14 ENCOUNTER — Emergency Department (HOSPITAL_BASED_OUTPATIENT_CLINIC_OR_DEPARTMENT_OTHER): Payer: Medicare HMO

## 2023-05-14 DIAGNOSIS — M25561 Pain in right knee: Secondary | ICD-10-CM | POA: Insufficient documentation

## 2023-05-14 DIAGNOSIS — S80212A Abrasion, left knee, initial encounter: Secondary | ICD-10-CM | POA: Diagnosis not present

## 2023-05-14 DIAGNOSIS — Z7901 Long term (current) use of anticoagulants: Secondary | ICD-10-CM | POA: Diagnosis not present

## 2023-05-14 DIAGNOSIS — I10 Essential (primary) hypertension: Secondary | ICD-10-CM | POA: Diagnosis not present

## 2023-05-14 DIAGNOSIS — S3991XA Unspecified injury of abdomen, initial encounter: Secondary | ICD-10-CM | POA: Diagnosis present

## 2023-05-14 DIAGNOSIS — R109 Unspecified abdominal pain: Secondary | ICD-10-CM

## 2023-05-14 DIAGNOSIS — Z79899 Other long term (current) drug therapy: Secondary | ICD-10-CM | POA: Diagnosis not present

## 2023-05-14 DIAGNOSIS — W19XXXA Unspecified fall, initial encounter: Secondary | ICD-10-CM | POA: Insufficient documentation

## 2023-05-14 DIAGNOSIS — R296 Repeated falls: Secondary | ICD-10-CM

## 2023-05-14 DIAGNOSIS — S301XXA Contusion of abdominal wall, initial encounter: Secondary | ICD-10-CM | POA: Diagnosis not present

## 2023-05-14 LAB — CBC WITH DIFFERENTIAL/PLATELET
Abs Immature Granulocytes: 0.01 10*3/uL (ref 0.00–0.07)
Basophils Absolute: 0.1 10*3/uL (ref 0.0–0.1)
Basophils Relative: 1 %
Eosinophils Absolute: 0 10*3/uL (ref 0.0–0.5)
Eosinophils Relative: 1 %
HCT: 39.7 % (ref 36.0–46.0)
Hemoglobin: 12.9 g/dL (ref 12.0–15.0)
Immature Granulocytes: 0 %
Lymphocytes Relative: 24 %
Lymphs Abs: 1.3 10*3/uL (ref 0.7–4.0)
MCH: 29.9 pg (ref 26.0–34.0)
MCHC: 32.5 g/dL (ref 30.0–36.0)
MCV: 92.1 fL (ref 80.0–100.0)
Monocytes Absolute: 0.6 10*3/uL (ref 0.1–1.0)
Monocytes Relative: 11 %
Neutro Abs: 3.4 10*3/uL (ref 1.7–7.7)
Neutrophils Relative %: 63 %
Platelets: 141 10*3/uL — ABNORMAL LOW (ref 150–400)
RBC: 4.31 MIL/uL (ref 3.87–5.11)
RDW: 13.8 % (ref 11.5–15.5)
WBC: 5.4 10*3/uL (ref 4.0–10.5)
nRBC: 0 % (ref 0.0–0.2)

## 2023-05-14 LAB — COMPREHENSIVE METABOLIC PANEL
ALT: 15 U/L (ref 0–44)
AST: 16 U/L (ref 15–41)
Albumin: 4 g/dL (ref 3.5–5.0)
Alkaline Phosphatase: 67 U/L (ref 38–126)
Anion gap: 9 (ref 5–15)
BUN: 22 mg/dL — ABNORMAL HIGH (ref 6–20)
CO2: 24 mmol/L (ref 22–32)
Calcium: 9.3 mg/dL (ref 8.9–10.3)
Chloride: 109 mmol/L (ref 98–111)
Creatinine, Ser: 2.08 mg/dL — ABNORMAL HIGH (ref 0.44–1.00)
GFR, Estimated: 27 mL/min — ABNORMAL LOW (ref >60)
Glucose, Bld: 91 mg/dL (ref 70–99)
Potassium: 4.4 mmol/L (ref 3.5–5.1)
Sodium: 142 mmol/L (ref 135–145)
Total Bilirubin: 0.5 mg/dL (ref <1.2)
Total Protein: 6.5 g/dL (ref 6.5–8.1)

## 2023-05-14 LAB — URINALYSIS, ROUTINE W REFLEX MICROSCOPIC
Bacteria, UA: NONE SEEN
Bilirubin Urine: NEGATIVE
Glucose, UA: NEGATIVE mg/dL
Hgb urine dipstick: NEGATIVE
Ketones, ur: NEGATIVE mg/dL
Nitrite: NEGATIVE
Protein, ur: NEGATIVE mg/dL
Specific Gravity, Urine: <1.005 — ABNORMAL LOW (ref 1.005–1.030)
pH: 6 (ref 5.0–8.0)

## 2023-05-14 LAB — TROPONIN I (HIGH SENSITIVITY): Troponin I (High Sensitivity): 3 ng/L (ref <18)

## 2023-05-14 LAB — LIPASE, BLOOD: Lipase: 21 U/L (ref 11–51)

## 2023-05-14 MED ORDER — METHYLPREDNISOLONE 4 MG PO TBPK: ORAL_TABLET | ORAL | 0 refills | Status: DC

## 2023-05-14 MED ORDER — HYDROCODONE-ACETAMINOPHEN 5-325 MG PO TABS
1.0000 | ORAL_TABLET | Freq: Once | ORAL | Status: AC
  Administered 2023-05-14: 1 via ORAL
  Filled 2023-05-14: qty 1

## 2023-05-14 MED ORDER — SODIUM CHLORIDE 0.9 % IV BOLUS
1000.0000 mL | Freq: Once | INTRAVENOUS | Status: AC
  Administered 2023-05-14: 1000 mL via INTRAVENOUS

## 2023-05-14 NOTE — ED Provider Notes (Signed)
Accepted handoff at shift change from Small PA-C. Please see prior provider note for full HPI.  Briefly: Patient is a 62 y.o. female who presents to the ER for L flank pain and bilateral knee pain after falls.  DDX/Plan: Follow up on XR imaging, anticipate dc to home.   Physical Exam  BP (!) 116/44   Pulse 79   Temp 98.6 F (37 C) (Oral)   Resp 19   Ht 5\' 6"  (1.676 m)   Wt 115.7 kg   SpO2 97%   BMI 41.16 kg/m   Physical Exam Vitals and nursing note reviewed.  Constitutional:      Appearance: Normal appearance.  HENT:     Head: Normocephalic and atraumatic.  Eyes:     Conjunctiva/sclera: Conjunctivae normal.  Cardiovascular:     Pulses:          Posterior tibial pulses are 2+ on the right side and 2+ on the left side.  Pulmonary:     Effort: Pulmonary effort is normal. No respiratory distress.  Musculoskeletal:     Comments: Tenderness to palpation over the left tibial plateau with small partially healed abrasion.  No laxity, compartments of the extremities are soft.  Normal sensation.  Skin:    General: Skin is warm and dry.  Neurological:     Mental Status: She is alert.  Psychiatric:        Mood and Affect: Mood normal.        Behavior: Behavior normal.    ED Course / MDM    Medical Decision Making Amount and/or Complexity of Data Reviewed Labs: ordered. Radiology: ordered.  Risk Prescription drug management.  DG Chest 2 View  Result Date: 05/14/2023 CLINICAL DATA:  Weakness, initial encounter EXAM: CHEST - 2 VIEW COMPARISON:  10/27/2022 FINDINGS: The heart size and mediastinal contours are within normal limits. Both lungs are clear. The visualized skeletal structures are unremarkable. IMPRESSION: No active cardiopulmonary disease. Electronically Signed   By: Alcide Clever M.D.   On: 05/14/2023 19:00   DG Knee Complete 4 Views Right  Result Date: 05/14/2023 CLINICAL DATA:  Recent fall with knee pain, initial encounter EXAM: RIGHT KNEE - COMPLETE 4+ VIEW  COMPARISON:  None Available. FINDINGS: Mild degenerative changes are noted most marked in the medial and patellofemoral space. No acute fracture or dislocation is noted. No joint effusion is noted. IMPRESSION: Mild degenerative change without acute abnormality. Electronically Signed   By: Alcide Clever M.D.   On: 05/14/2023 18:59   DG Knee Complete 4 Views Left  Result Date: 05/14/2023 CLINICAL DATA:  Recent fall with knee pain, initial encounter EXAM: LEFT KNEE - COMPLETE 4+ VIEW COMPARISON:  None Available. FINDINGS: Mild tricompartmental degenerative changes are noted. No joint effusion is seen. No acute fracture or dislocation is seen. IMPRESSION: Mild degenerative change without acute abnormality. Electronically Signed   By: Alcide Clever M.D.   On: 05/14/2023 18:58    Disposition: XR imaging reassuring.  Discussed results with patient.  We will plan for knee immobilizer, she feels this would probably be best on her right knee as it feels more unstable than the left.  She has been using a walker, and I recommended continuing this while she feels unsteady.  Will give Medrol Dosepak, recommend continuing OTC meds, and orthopedic follow-up if symptoms do not improve.  No emergent etiology found for flank pain today.  Patient is following up with her urologist next week for procedure, and with her nephrologist for recent medication  changes for interstitial cystitis.  Patient discharged in stable condition all questions answered.   Su Monks, PA-C 05/14/23 1940    Sloan Leiter, DO 05/20/23 [phone]

## 2023-05-14 NOTE — ED Notes (Signed)
Spoke with Edison in lab, added troponin to labs already collected.

## 2023-05-14 NOTE — ED Triage Notes (Signed)
Pt POV reporting L flank pain past few days, started abx yesterday w slight improvement, also reporting 2 mechanical falls due to legs "giving out". Reporting bilateral knee pain, able to bend legs appropriately.

## 2023-05-14 NOTE — ED Notes (Signed)
EDP verbally approved to d/c fluid administration. EDP notified of administered and remaining amount. EDP approved d/c for discharge.

## 2023-05-14 NOTE — Discharge Instructions (Addendum)
You were seen in the emergency department today for flank pain and knee pain after fall.  As we discussed your blood work was reassuring.  Your CT scan of your abdomen/pelvis did not show any acute findings.  Your x-rays of your chest and knees did not show any broken or dislocated bones.  I suspect that you likely sprained at least your right knee when trying to get up yesterday.  We placed this in an immobilizer.  I have started you on a short course of some steroid medicine to help with some of the inflammation, and you can continue taking over-the-counter medications as needed for pain.  If your symptoms are no better within a week or so, I recommend following up with the orthopedist, and have attached the contact information.  Continue to monitor how you're doing and return to the ER for new or worsening symptoms.

## 2023-05-14 NOTE — ED Notes (Signed)
Pt transported to CT ?

## 2023-05-14 NOTE — ED Provider Notes (Signed)
[facility] AT [facility] Provider Note   CSN: [id number] Arrival date & time: 05/14/23  1553     History  Chief Complaint  Patient presents with   Flank Pain   Fall    Dana [name] is a 62 y.o. female, history of interstitial cystitis, GERD, hypertension, who presents to the ED secondary to left flank pain, it has been going on for the last few weeks.  She states he was started on antibiotics, and finally got some relief last night, but prior to that, she had some weakness, and fell last Monday.  She states she fell onto her left knee last Monday, and has a big scrape on it.  Last tetanus shot was earlier this year.  Notes since then she has had just some knee pain.  Then she fell yesterday, and that was after feeling just very weak.  Notes that she fell on both her knees yesterday, twice, but she states he got better this morning, after she had taken 1 dose of the antibiotic that she is currently on for possible urinary tract infection.  Is on Eliquis, but denies any head trauma.  Notes that she may have bumped her abdomen, but is not sure.  Denies any blood in her urine.  Home Medications Prior to Admission medications   Medication Sig Start Date End Date Taking? Authorizing Provider  Acetaminophen (TYLENOL) 325 MG CAPS 3 capsules    [provider]  ALPRAZolam (XANAX) 0.5 MG tablet Take 1 tablet (0.5 mg total) by mouth 2 (two) times daily as needed for anxiety. 01/19/22 11/14/22  Corie Chiquito, PMHNP  amitriptyline (ELAVIL) 25 MG tablet Take 25 mg by mouth at bedtime.    [provider]  cefdinir (OMNICEF) 300 MG capsule Take 1 capsule (300 mg total) by mouth 2 (two) times daily. 04/16/23   Renne Crigler, PA-C  cyclobenzaprine (FLEXERIL) 10 MG tablet Take 10 mg by mouth 3 (three) times daily as needed for muscle spasms. 08/24/22   [provider]  divalproex (DEPAKOTE ER) 250 MG 24 hr tablet Take 1 tablet (250 mg total) by  mouth daily. Take with a 500 mg tablet to equal total dose of 750 mg 04/24/23 10/21/23  Corie Chiquito, PMHNP  divalproex (DEPAKOTE ER) 500 MG 24 hr tablet Take 1 tablet (500 mg total) by mouth at bedtime. Take with a 250 mg tablet to equal total dose of 750 mg 04/24/23 10/21/23  Corie Chiquito, PMHNP  ELIQUIS 2.5 MG TABS tablet TAKE 1 TABLET(2.5 MG) BY MOUTH TWICE DAILY 04/10/23   Jaci Standard, MD  esomeprazole (NEXIUM) 40 MG capsule Take 40 mg by mouth 2 (two) times daily before a meal. 09/08/19   [provider]  fluticasone (FLONASE) 50 MCG/ACT nasal spray Place 1 spray into the nose daily. 08/09/22   [provider]  furosemide (LASIX) 20 MG tablet Take 20 mg by mouth every other day. Patient not taking: Reported on 04/24/2023    [provider]  gabapentin (NEURONTIN) 300 MG capsule Take 300 mg by mouth 3 (three) times daily. 03/30/22   [provider]  hydrOXYzine (ATARAX) 25 MG tablet Take 25 mg by mouth at bedtime. 04/14/23 08/12/23  [provider]  metoprolol tartrate (LOPRESSOR) 25 MG tablet TAKE 1 TABLET(25 MG) BY MOUTH TWICE DAILY 04/25/23   Rollene Rotunda, MD  ondansetron (ZOFRAN) 4 MG tablet as needed. 01/30/20   [provider]  oxyCODONE (OXY IR/ROXICODONE) 5 MG immediate release  tablet Take 5 mg by mouth every 12 (twelve) hours as needed.    [provider]  rizatriptan (MAXALT) 10 MG tablet Take by mouth. 01/20/21   [provider]  rosuvastatin (CRESTOR) 40 MG tablet Take 1 tablet (40 mg total) by mouth daily. 03/21/23 06/19/23  Rollene Rotunda, MD  tamsulosin (FLOMAX) 0.4 MG CAPS capsule Take 0.4 mg by mouth daily. 07/18/21   [provider]      Allergies    Anesthetics, amide; Other; Oxybutynin; Hyoscyamine; and Codeine    Review of Systems   Review of Systems  Genitourinary:  Positive for flank pain.    Physical Exam Updated Vital Signs BP (!) 116/44   Pulse 79   Temp 98.6 F (37 C)  (Oral)   Resp 19   Ht 5\' 6"  (1.676 m)   Wt 115.7 kg   SpO2 97%   BMI 41.16 kg/m  Physical Exam Vitals and nursing note reviewed.  Constitutional:      General: She is not in acute distress.    Appearance: She is well-developed.  HENT:     Head: Normocephalic and atraumatic.  Eyes:     Conjunctiva/sclera: Conjunctivae normal.  Cardiovascular:     Rate and Rhythm: Normal rate and regular rhythm.     Heart sounds: No murmur heard. Pulmonary:     Effort: Pulmonary effort is normal. No respiratory distress.     Breath sounds: Normal breath sounds.  Abdominal:     Palpations: Abdomen is soft.     Tenderness: There is no abdominal tenderness.  Musculoskeletal:        General: No swelling.     Cervical back: Neck supple.     Comments: Bilateral Knees: Tenderness to palpation of both medial and lateral joint lines of bilateral knees (L more tender than R). An effusion is not present.  Negative anterior and posterior drawer. Negative Mcmurray's. +Patellar stability. Negative valgus and varus stress test.. Extension and flexion intact. No sensory deficits. +DPs   Skin:    General: Skin is warm and dry.     Capillary Refill: Capillary refill takes less than 2 seconds.     Comments: +ecchymoses of LLQ, +abrasion of L knee  Neurological:     Mental Status: She is alert.  Psychiatric:        Mood and Affect: Mood normal.     ED Results / Procedures / Treatments   Labs (all labs ordered are listed, but only abnormal results are displayed) Labs Reviewed  URINALYSIS, ROUTINE W REFLEX MICROSCOPIC - Abnormal; Notable for the following components:      Result Value   Color, Urine COLORLESS (*)    Specific Gravity, Urine <1.005 (*)    Leukocytes,Ua TRACE (*)    All other components within normal limits  CBC WITH DIFFERENTIAL/PLATELET - Abnormal; Notable for the following components:   Platelets 141 (*)    All other components within normal limits  COMPREHENSIVE METABOLIC PANEL -  Abnormal; Notable for the following components:   BUN 22 (*)    Creatinine, Ser 2.08 (*)    GFR, Estimated 27 (*)    All other components within normal limits  LIPASE, BLOOD  TROPONIN I (HIGH SENSITIVITY)    EKG None  Radiology CT ABDOMEN PELVIS WO CONTRAST  Result Date: 05/14/2023 CLINICAL DATA:  Left flank pain EXAM: CT ABDOMEN AND PELVIS WITHOUT CONTRAST TECHNIQUE: Multidetector CT imaging of the abdomen and pelvis was performed following the standard protocol without IV contrast.  RADIATION DOSE REDUCTION: This exam was performed according to the departmental dose-optimization program which includes automated exposure control, adjustment of the mA and/or kV according to patient size and/or use of iterative reconstruction technique. COMPARISON:  04/16/2023 FINDINGS: Lower chest: Mild bibasilar scarring/atelectasis. Hepatobiliary: 19 mm cyst in the posterior right hepatic lobe (series 2/image 25), benign. Status post cholecystectomy. No intrahepatic or extrahepatic ductal dilatation. Pancreas: Within normal limits. Spleen: Within normal limits. Adrenals/Urinary Tract: Adrenal glands are within normal limits. Bilateral renal cysts, measuring up to 2.0 cm in the anterior left lower kidney (series 2/image 41). Measuring simple fluid density, benign (Bosniak I). No follow-up is recommended. No renal, ureteral, or bladder calculi.  No hydronephrosis. Bladder is within normal limits. Stomach/Bowel: Stomach is within normal limits. No evidence of bowel obstruction. Normal appendix (series 2/image 64). Sigmoid diverticulosis, without evidence of diverticulitis. Vascular/Lymphatic: No evidence of abdominal aortic aneurysm. Atherosclerotic calcifications of the abdominal aorta and branch vessels. No suspicious abdominopelvic lymphadenopathy. Reproductive: Status post hysterectomy. No adnexal masses. Other: No abdominopelvic ascites. Musculoskeletal: Mild degenerative changes at L4-5. IMPRESSION: No renal,  ureteral, or bladder calculi. No hydronephrosis. Sigmoid diverticulosis, without evidence of diverticulitis. No CT findings to account for the patient's left abdominal pain. Electronically Signed   By: Charline Bills M.D.   On: 05/14/2023 18:38    Procedures Procedures    Medications Ordered in ED Medications  sodium chloride 0.9 % bolus 1,000 mL (1,000 mLs Intravenous New Bag/Given 05/14/23 1845)  HYDROcodone-acetaminophen (NORCO/VICODIN) 5-325 MG per tablet 1 tablet (1 tablet Oral Given 05/14/23 1827)    ED Course/ Medical Decision Making/ A&P                                 Medical Decision Making Patient is a 62 year old female, here for left flank pain has been on for the last few weeks.  She states she is feeling better after the antibiotics.  Has bruising on her abdomen thus we will obtain a CT abdomen pelvis, without contrast given her creatinine, for further evaluation.  She is not a good candidate with IV contrast thus that was not ordered given her kidney function.  Additionally we will obtain bilateral x-rays, bilateral knees, for further evaluation of her knee pain, to rule out any kind of fracture.  Blood work to check for electrolyte dysfunction, as well as chest x-ray given weakness.  Amount and/or Complexity of Data Reviewed Labs: ordered.    Details: Reassuring labs thus far Radiology: ordered.    Details: CT abd pelvis, fairly unremarkable Discussion of management or test interpretation with external provider(s): Signed out to Lorin PA, pending chest x-ray, and further labs, and images.  She will follow-up on these results, and discharge patient appropriately.  Risk Prescription drug management.   Final Clinical Impression(s) / ED Diagnoses Final diagnoses:  None    Rx / DC Orders ED Discharge Orders     None         Kathan Kirker, [place]ley [place], PA 05/14/23 1847    Sloan Leiter, DO 05/20/23 [phone]

## 2023-05-19 ENCOUNTER — Ambulatory Visit: Payer: Medicare HMO | Admitting: Podiatry

## 2023-06-12 NOTE — Progress Notes (Deleted)
  Cardiology Office Note:   Date:  06/12/2023  ID:  Dana [name], DOB Sep 03, 1960, MRN [id number] PCP: Rolinda Millman, MD  [facility] Providers Cardiologist:  Lynwood Schilling, MD {  History of Present Illness:   Dana [name] is a 63 y.o. female who presented for evaluation of chest discomfort and palpitations.  She had previously been seen at [facility] she wore a monitor for 3 days.  This demonstrated predominantly normal sinus rhythm.  She had a low supraventricular and ventricular ectopic beat burden.  There was very rare supraventricular tachycardia lasting only 5 beats.  She had normal heart rate variation.  An echocardiogram demonstrated well-preserved ejection fraction.  There were no valvular abnormalities. In Jan she was in the ED with palpitations and chest pain.  She had chest pain without any evidence for ischemia.  She had palpitations.  She does have coronary calcium  and aortic atherosclerosis noted on a previous CT. she presents with continued palpitations and continuous pain.  She wore another monitor and she had NSR with PVCS.     Since I last saw her ***  ***  She denies any new cardiovascular.  She is having some lower extremity swelling related to amlodipine  that was started by her nephrologist.  She is not having any new shortness of breath, PND or orthopnea.  She is not having any chest pressure, neck or arm discomfort.  She is lost about 7 pounds with diet.  She goes to her grandsons T-[employer] and her granddaughters softball games and she does a lot of walking there.  ROS: ***  Studies Reviewed:    EKG:       ***  Risk Assessment/Calculations:   {Does this patient have ATRIAL FIBRILLATION?:[id number]} No BP recorded.  {Refresh Note OR Click here to enter BP  :1}***        Physical Exam:   VS:  There were no vitals taken for this visit.   Wt Readings from Last 3 Encounters:  05/14/23 255 lb (115.7 kg)  04/16/23 250 lb (113.4 kg)  03/31/23 255 lb 6.4 oz (115.8  kg)     GEN: Well nourished, well developed in no acute distress NECK: No JVD; No carotid bruits CARDIAC: ***RR, *** murmurs, rubs, gallops RESPIRATORY:  Clear to auscultation without rales, wheezing or rhonchi  ABDOMEN: Soft, non-tender, non-distended EXTREMITIES:  No edema; No deformity   ASSESSMENT AND PLAN:   Chest pain:    ***  She is not having any further chest discomfort.  No further workup.  She had a negative perfusion study in the facility in June 2023.   Palpitations:    ***   Beta-blockers have improved her palpitations.  No change in therapy.   Hypertension: Her blood pressure is ***  controlled and she is worried about the edema.  I am going to reduce her amlodipine  to 2.5 mg and she can let me know if her blood pressure increases.  It is Controlled.    CKD IIIb:  ***  She follows with nephrology.        DVT:   ***  She continues chronic anticoagulation.    Dyslipidemia:  ***   increased her Crestor  over the phone this week as her LDL was not at the goal of 50.  I will repeat a lipid in 3 months.      Follow up ***  Signed, Lynwood Schilling, MD

## 2023-06-15 ENCOUNTER — Ambulatory Visit: Payer: Medicare HMO | Admitting: Cardiology

## 2023-06-15 DIAGNOSIS — N1832 Chronic kidney disease, stage 3b: Secondary | ICD-10-CM

## 2023-06-15 DIAGNOSIS — R072 Precordial pain: Secondary | ICD-10-CM

## 2023-06-15 DIAGNOSIS — E785 Hyperlipidemia, unspecified: Secondary | ICD-10-CM

## 2023-06-15 DIAGNOSIS — R002 Palpitations: Secondary | ICD-10-CM

## 2023-06-17 ENCOUNTER — Emergency Department (HOSPITAL_COMMUNITY): Payer: Medicare PPO

## 2023-06-17 ENCOUNTER — Other Ambulatory Visit: Payer: Self-pay

## 2023-06-17 ENCOUNTER — Encounter (HOSPITAL_COMMUNITY): Payer: Self-pay

## 2023-06-17 ENCOUNTER — Inpatient Hospital Stay (HOSPITAL_COMMUNITY)
Admission: EM | Admit: 2023-06-17 | Discharge: 2023-06-21 | DRG: 690 | Disposition: A | Discharge status: Home Health | Payer: Medicare PPO | Attending: Internal Medicine | Admitting: Internal Medicine

## 2023-06-17 DIAGNOSIS — R296 Repeated falls: Secondary | ICD-10-CM

## 2023-06-17 DIAGNOSIS — Z87891 Personal history of nicotine dependence: Secondary | ICD-10-CM

## 2023-06-17 DIAGNOSIS — R000 Tachycardia, unspecified: Secondary | ICD-10-CM | POA: Diagnosis present

## 2023-06-17 DIAGNOSIS — R531 Weakness: Secondary | ICD-10-CM | POA: Diagnosis not present

## 2023-06-17 DIAGNOSIS — Z9049 Acquired absence of other specified parts of digestive tract: Secondary | ICD-10-CM | POA: Diagnosis not present

## 2023-06-17 DIAGNOSIS — N390 Urinary tract infection, site not specified: Secondary | ICD-10-CM | POA: Diagnosis not present

## 2023-06-17 DIAGNOSIS — Z8709 Personal history of other diseases of the respiratory system: Secondary | ICD-10-CM | POA: Diagnosis not present

## 2023-06-17 DIAGNOSIS — K219 Gastro-esophageal reflux disease without esophagitis: Secondary | ICD-10-CM | POA: Diagnosis present

## 2023-06-17 DIAGNOSIS — I129 Hypertensive chronic kidney disease with stage 1 through stage 4 chronic kidney disease, or unspecified chronic kidney disease: Secondary | ICD-10-CM | POA: Diagnosis present

## 2023-06-17 DIAGNOSIS — M533 Sacrococcygeal disorders, not elsewhere classified: Secondary | ICD-10-CM | POA: Diagnosis not present

## 2023-06-17 DIAGNOSIS — M51369 Other intervertebral disc degeneration, lumbar region without mention of lumbar back pain or lower extremity pain: Secondary | ICD-10-CM | POA: Diagnosis not present

## 2023-06-17 DIAGNOSIS — E782 Mixed hyperlipidemia: Secondary | ICD-10-CM | POA: Diagnosis not present

## 2023-06-17 DIAGNOSIS — Z86718 Personal history of other venous thrombosis and embolism: Secondary | ICD-10-CM | POA: Diagnosis not present

## 2023-06-17 DIAGNOSIS — I10 Essential (primary) hypertension: Secondary | ICD-10-CM | POA: Diagnosis present

## 2023-06-17 DIAGNOSIS — N1832 Chronic kidney disease, stage 3b: Secondary | ICD-10-CM | POA: Diagnosis not present

## 2023-06-17 DIAGNOSIS — F319 Bipolar disorder, unspecified: Secondary | ICD-10-CM | POA: Diagnosis present

## 2023-06-17 DIAGNOSIS — J449 Chronic obstructive pulmonary disease, unspecified: Secondary | ICD-10-CM | POA: Diagnosis not present

## 2023-06-17 DIAGNOSIS — S0990XA Unspecified injury of head, initial encounter: Secondary | ICD-10-CM | POA: Diagnosis not present

## 2023-06-17 DIAGNOSIS — M48061 Spinal stenosis, lumbar region without neurogenic claudication: Secondary | ICD-10-CM | POA: Diagnosis not present

## 2023-06-17 DIAGNOSIS — E66813 Obesity, class 3: Secondary | ICD-10-CM | POA: Diagnosis present

## 2023-06-17 DIAGNOSIS — M1611 Unilateral primary osteoarthritis, right hip: Secondary | ICD-10-CM | POA: Diagnosis not present

## 2023-06-17 DIAGNOSIS — Z7901 Long term (current) use of anticoagulants: Secondary | ICD-10-CM

## 2023-06-17 DIAGNOSIS — M25551 Pain in right hip: Secondary | ICD-10-CM | POA: Diagnosis not present

## 2023-06-17 DIAGNOSIS — M25561 Pain in right knee: Secondary | ICD-10-CM | POA: Diagnosis not present

## 2023-06-17 DIAGNOSIS — Z90710 Acquired absence of both cervix and uterus: Secondary | ICD-10-CM

## 2023-06-17 DIAGNOSIS — E785 Hyperlipidemia, unspecified: Secondary | ICD-10-CM | POA: Diagnosis not present

## 2023-06-17 DIAGNOSIS — Z79899 Other long term (current) drug therapy: Secondary | ICD-10-CM

## 2023-06-17 DIAGNOSIS — I824Y3 Acute embolism and thrombosis of unspecified deep veins of proximal lower extremity, bilateral: Secondary | ICD-10-CM | POA: Diagnosis not present

## 2023-06-17 DIAGNOSIS — Z885 Allergy status to narcotic agent status: Secondary | ICD-10-CM

## 2023-06-17 DIAGNOSIS — Z96642 Presence of left artificial hip joint: Secondary | ICD-10-CM | POA: Diagnosis not present

## 2023-06-17 DIAGNOSIS — M545 Low back pain, unspecified: Secondary | ICD-10-CM | POA: Diagnosis present

## 2023-06-17 DIAGNOSIS — W1830XA Fall on same level, unspecified, initial encounter: Secondary | ICD-10-CM | POA: Diagnosis present

## 2023-06-17 DIAGNOSIS — M5125 Other intervertebral disc displacement, thoracolumbar region: Secondary | ICD-10-CM | POA: Diagnosis not present

## 2023-06-17 DIAGNOSIS — S3992XA Unspecified injury of lower back, initial encounter: Secondary | ICD-10-CM | POA: Diagnosis not present

## 2023-06-17 DIAGNOSIS — I82409 Acute embolism and thrombosis of unspecified deep veins of unspecified lower extremity: Secondary | ICD-10-CM | POA: Diagnosis present

## 2023-06-17 DIAGNOSIS — Z818 Family history of other mental and behavioral disorders: Secondary | ICD-10-CM

## 2023-06-17 DIAGNOSIS — B9620 Unspecified Escherichia coli [E. coli] as the cause of diseases classified elsewhere: Secondary | ICD-10-CM | POA: Diagnosis present

## 2023-06-17 DIAGNOSIS — G629 Polyneuropathy, unspecified: Secondary | ICD-10-CM | POA: Diagnosis present

## 2023-06-17 DIAGNOSIS — Z87440 Personal history of urinary (tract) infections: Secondary | ICD-10-CM | POA: Diagnosis not present

## 2023-06-17 DIAGNOSIS — Z888 Allergy status to other drugs, medicaments and biological substances status: Secondary | ICD-10-CM

## 2023-06-17 DIAGNOSIS — N3000 Acute cystitis without hematuria: Principal | ICD-10-CM | POA: Diagnosis present

## 2023-06-17 DIAGNOSIS — M25552 Pain in left hip: Secondary | ICD-10-CM | POA: Diagnosis not present

## 2023-06-17 DIAGNOSIS — G8929 Other chronic pain: Secondary | ICD-10-CM | POA: Diagnosis present

## 2023-06-17 DIAGNOSIS — G319 Degenerative disease of nervous system, unspecified: Secondary | ICD-10-CM | POA: Diagnosis not present

## 2023-06-17 DIAGNOSIS — Z6841 Body Mass Index (BMI) 40.0 and over, adult: Secondary | ICD-10-CM

## 2023-06-17 DIAGNOSIS — M5126 Other intervertebral disc displacement, lumbar region: Secondary | ICD-10-CM | POA: Diagnosis not present

## 2023-06-17 DIAGNOSIS — M1711 Unilateral primary osteoarthritis, right knee: Secondary | ICD-10-CM | POA: Diagnosis not present

## 2023-06-17 LAB — CBC
HCT: 39 % (ref 36.0–46.0)
Hemoglobin: 12.9 g/dL (ref 12.0–15.0)
MCH: 30.6 pg (ref 26.0–34.0)
MCHC: 33.1 g/dL (ref 30.0–36.0)
MCV: 92.6 fL (ref 80.0–100.0)
Platelets: 176 10*3/uL (ref 150–400)
RBC: 4.21 MIL/uL (ref 3.87–5.11)
RDW: 13.7 % (ref 11.5–15.5)
WBC: 7.5 10*3/uL (ref 4.0–10.5)
nRBC: 0 % (ref 0.0–0.2)

## 2023-06-17 LAB — URINALYSIS, ROUTINE W REFLEX MICROSCOPIC
Bilirubin Urine: NEGATIVE
Glucose, UA: NEGATIVE mg/dL
Ketones, ur: NEGATIVE mg/dL
Nitrite: POSITIVE — AB
Protein, ur: NEGATIVE mg/dL
Specific Gravity, Urine: 1.003 — ABNORMAL LOW (ref 1.005–1.030)
pH: 7 (ref 5.0–8.0)

## 2023-06-17 LAB — BASIC METABOLIC PANEL
Anion gap: 14 (ref 5–15)
BUN: 22 mg/dL — ABNORMAL HIGH (ref 6–20)
CO2: 20 mmol/L — ABNORMAL LOW (ref 22–32)
Calcium: 9.2 mg/dL (ref 8.9–10.3)
Chloride: 109 mmol/L (ref 98–111)
Creatinine, Ser: 1.58 mg/dL — ABNORMAL HIGH (ref 0.44–1.00)
GFR, Estimated: 37 mL/min — ABNORMAL LOW (ref >60)
Glucose, Bld: 106 mg/dL — ABNORMAL HIGH (ref 70–99)
Potassium: 4.3 mmol/L (ref 3.5–5.1)
Sodium: 143 mmol/L (ref 135–145)

## 2023-06-17 LAB — CBG MONITORING, ED: Glucose-Capillary: 100 mg/dL — ABNORMAL HIGH (ref 70–99)

## 2023-06-17 MED ORDER — ACETAMINOPHEN 650 MG RE SUPP: 650.0000 mg | Freq: Four times a day (QID) | RECTAL | Status: DC | PRN

## 2023-06-17 MED ORDER — OXYCODONE HCL 5 MG PO TABS
10.0000 mg | ORAL_TABLET | Freq: Once | ORAL | Status: AC
Start: 2023-06-17 — End: 2023-06-17
  Administered 2023-06-17: 10 mg via ORAL
  Filled 2023-06-17: qty 2

## 2023-06-17 MED ORDER — SODIUM CHLORIDE 0.9 % IV BOLUS
1000.0000 mL | Freq: Once | INTRAVENOUS | Status: AC
  Administered 2023-06-17: 1000 mL via INTRAVENOUS

## 2023-06-17 MED ORDER — MELATONIN 3 MG PO TABS
3.0000 mg | ORAL_TABLET | Freq: Every evening | ORAL | Status: DC | PRN
  Administered 2023-06-18 – 2023-06-19 (×2): 3 mg via ORAL
  Filled 2023-06-17 (×2): qty 1

## 2023-06-17 MED ORDER — SODIUM CHLORIDE 0.9 % IV SOLN
1.0000 g | Freq: Once | INTRAVENOUS | Status: AC
  Administered 2023-06-17: 1 g via INTRAVENOUS
  Filled 2023-06-17: qty 10

## 2023-06-17 MED ORDER — ACETAMINOPHEN 325 MG PO TABS: 650.0000 mg | ORAL_TABLET | Freq: Four times a day (QID) | ORAL | Status: DC | PRN

## 2023-06-17 MED ORDER — SODIUM CHLORIDE 0.9 % IV SOLN
1.0000 g | INTRAVENOUS | Status: DC
  Administered 2023-06-18 – 2023-06-20 (×3): 1 g via INTRAVENOUS
  Filled 2023-06-17 (×3): qty 10

## 2023-06-17 MED ORDER — ONDANSETRON HCL 4 MG/2ML IJ SOLN: 4.0000 mg | Freq: Four times a day (QID) | INTRAMUSCULAR | Status: DC | PRN

## 2023-06-17 NOTE — ED Notes (Signed)
 Pt returned from MRI

## 2023-06-17 NOTE — ED Notes (Addendum)
 Pt was helped up to [facility], but urinated on self before she sat down. 2 person assist to help pt back to bed due to weakness in her legs

## 2023-06-17 NOTE — ED Triage Notes (Signed)
 Pt BIB [facility] with reports of right knee pain and lower back pain after a fall today. Pt had a fall Wednesday hitting her head pt is on blood thinners and was not seen.

## 2023-06-17 NOTE — H&P (Addendum)
 History and Physical      Dana [name] FMW:[id number] DOB: [birth year] DOA: 06/17/2023; DOS: 06/17/2023  PCP: Rolinda Millman, MD  Patient coming from: home   I have personally briefly reviewed patient's old medical records in [facility]  Chief Complaint: Generalized weakness   HPI: Dana [name] is a 63 y.o. 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   Patient reports approximately 1 week of generalized weakness in the absence of acute focal weakness.  She also conveys that she has been experiencing 10 days of dysuria in the absence of gross hematuria, and that she was diagnosed 1 week ago with acute cystitis.  she notes that she completed an ensuing course of oral antibiotics as an outpatient, but has noted no interval improvement in her dysuria.  She does not recall the specific name of the antibiotic that she was on for her urinary tract infection over the course the last week, and notes that she was compliant in completing the recommended course.  Denies any associated subjective fever, chills, rigors, generalized myalgias.  No recent bike discomfort or abdominal pain.  No recent neck stiffness or rash.  No recent cough, shortness of breath.  In the setting of her recent generalized weakness, patient reports that she is experienced multiple ground-level mechanical falls over the course of the last week, stated she is repeatedly tripped while trying to ambulate at home.  She conveys that none of these falls were associated with loss of consciousness, and she does not believe that she hit her head as a component  of any of these falls.  No acute neck discomfort.  She denies that she is chronically anticoagulated on Eliquis  in the setting of a history of DVTs involving the bilateral lower extremities.  She also conveys chronic low back pain, for which she follows with neurosurgery.  She reports some acute right knee discomfort as a consequence of the recurrent falls over the course the last week, but otherwise denies acute arthralgias or myalgias.  Medical history notable for essential hypertension, with outpatient hypertensive medications include Lopressor .      ED Course:  Vital signs in the ED were notable for the following: Temperature max 99.2; heart rates in the 1 teens 120s; systolic blood pressures in the 1 teens to 150s; respiratory rate 20-26, oxygen saturation 93 to 96% on room air.  Labs were notable for the following: BMP notable for sodium 143, bicarbonate 20, anion gap 14, creatinine 1.58 compared to 2.08 on 05/14/2023, glucose 106.  CBC notable for open cell count 7500.  Urinalysis showed 11-20 white blood cells, large leukocyte esterase, nitrate positive finding and no evidence of squamous epithelial cells.  Per my interpretation, EKG in ED demonstrated the following: Sinus tachycardia with heart rate 121, normal intervals, and no evidence of T wave or ST changes, including no evidence of ST elevation.  Imaging in the ED, per corresponding formal radiology read, was notable for the following: In terms of the right knee showed no evidence of acute bony abnormality or dislocation.  CT head showed no evidence of acute intracranial process, including no evidence of intracranial hemorrhage or any evidence of acute infarct.  MRI of the thoracic lumbar spine were notable for the following: Mild bilateral lateral recess stenosis and early spinal stenosis at L3/L4, moderate right and mild left lateral recess stenosis at L4-L5 and mild bilateral lateral recess encroachment at L5/S1.  While in the ED,  the following were administered: Oxycodone  IR 10 mg p.o. x 1 dose, Rocephin , normal saline x 1 L bolus.  Subsequently, the patient was admitted for further evaluation management of presenting acute cystitis in the setting of failure of outpatient oral antibiotic with presentation complicated by generalized weakness associated with recurrent falls.   Review of Systems: As per HPI otherwise 10 point review of systems negative.   Past Medical History:  Diagnosis Date   Arthritis    oa   Bipolar disorder (HCC)    Chronic foot pain    Complication of anesthesia    COPD (chronic obstructive pulmonary disease) (HCC)    DVT (deep venous thrombosis) (HCC)    GERD (gastroesophageal reflux disease)    Headache    migraines   Hyperlipidemia    Hypertension    Neuropathy    Palpitations last 1 month ago   PONV (postoperative nausea and vomiting)    Sleep apnea     Past Surgical History:  Procedure Laterality Date   ABDOMINAL HYSTERECTOMY  1998   complete   CESAREAN SECTION  1991   CHOLECYSTECTOMY  2005   FOOT SURGERY     left hip muscle tear surgery  2008   shoulder scope Left 2015   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   Procedure: LEFT TOTAL HIP ARTHROPLASTY ANTERIOR APPROACH;  Surgeon: Dempsey Moan, MD;  Location: [facility] ORS;  Service: Orthopedics;  Laterality: Left;   TUBAL LIGATION  1992    Social History:  reports that she quit smoking about 12 years ago. Her smoking use included cigarettes and e-cigarettes. She started smoking about 46 years ago. She has a 34 pack-year smoking history. She has never used smokeless tobacco. She reports that she does not drink alcohol and does not use drugs.   Allergies  Allergen Reactions   Anesthetics, Amide Nausea And Vomiting   Other    Oxybutynin Other (See Comments)   Hyoscyamine    Codeine Nausea And Vomiting and Nausea Only    Family History  Problem Relation Age of Onset   Mood Disorder Mother    Colon cancer Mother     Pulmonary fibrosis Father    Idiopathic pulmonary fibrosis Father        worked in a mill/asbestos exp   Pulmonary fibrosis Sister    Bipolar disorder Brother    Bipolar disorder Son     Family history reviewed and not pertinent    Prior to Admission medications   Medication Sig Start Date End Date Taking? Authorizing Provider  Acetaminophen  (TYLENOL ) 325 MG CAPS 3 capsules    [provider]  ALPRAZolam  (XANAX ) 0.5 MG tablet Take 1 tablet (0.5 mg total) by mouth 2 (two) times daily as needed for anxiety. 01/19/22 11/14/22  Franchot Raisin, PMHNP  amitriptyline  (ELAVIL ) 25 MG tablet Take 25 mg by mouth at bedtime.    [provider]  cefdinir  (OMNICEF ) 300 MG capsule Take 1 capsule (300 mg total) by mouth 2 (two) times daily. 04/16/23   Geiple, Joshua, PA-C  cyclobenzaprine  (FLEXERIL ) 10 MG tablet Take 10 mg by mouth 3 (three) times daily as needed for muscle spasms. 08/24/22   [provider]  divalproex  (DEPAKOTE  ER) 250 MG 24 hr  tablet Take 1 tablet (250 mg total) by mouth daily. Take with a 500 mg tablet to equal total dose of 750 mg 04/24/23 10/21/23  Franchot Raisin, PMHNP  divalproex  (DEPAKOTE  ER) 500 MG 24 hr tablet Take 1 tablet (500 mg total) by mouth at bedtime. Take with a 250 mg tablet to equal total dose of 750 mg 04/24/23 10/21/23  Franchot Raisin, PMHNP  ELIQUIS  2.5 MG TABS tablet TAKE 1 TABLET(2.5 MG) BY MOUTH TWICE DAILY 04/10/23   Dorsey, John T IV, MD  esomeprazole (NEXIUM) 40 MG capsule Take 40 mg by mouth 2 (two) times daily before a meal. 09/08/19   [provider]  fluticasone (FLONASE) 50 MCG/ACT nasal spray Place 1 spray into the nose daily. 08/09/22   [provider]  furosemide (LASIX) 20 MG tablet Take 20 mg by mouth every other day. Patient not taking: Reported on 04/24/2023    [provider]  gabapentin  (NEURONTIN ) 300 MG capsule Take 300 mg by mouth 3 (three) times daily. 03/30/22   [provider]   hydrOXYzine (ATARAX) 25 MG tablet Take 25 mg by mouth at bedtime. 04/14/23 08/12/23  [provider]  methylPREDNISolone  (MEDROL  DOSEPAK) 4 MG TBPK tablet Take per package instructions 05/14/23   Roemhildt, Lorin T, PA-C  metoprolol  tartrate (LOPRESSOR ) 25 MG tablet TAKE 1 TABLET(25 MG) BY MOUTH TWICE DAILY 04/25/23   Lavona Agent, MD  ondansetron  (ZOFRAN ) 4 MG tablet as needed. 01/30/20   [provider]  oxyCODONE  (OXY IR/ROXICODONE ) 5 MG immediate release tablet Take 5 mg by mouth every 12 (twelve) hours as needed.    [provider]  rizatriptan (MAXALT) 10 MG tablet Take by mouth. 01/20/21   [provider]  rosuvastatin  (CRESTOR ) 40 MG tablet Take 1 tablet (40 mg total) by mouth daily. 03/21/23 06/19/23  Lavona Agent, MD  tamsulosin (FLOMAX) 0.4 MG CAPS capsule Take 0.4 mg by mouth daily. 07/18/21   [provider]     Objective    Physical Exam: Vitals:   06/17/23 1945 06/17/23 2030 06/17/23 2130 06/17/23 2300  BP: 116/85 (!) 117/93 112/82   Pulse: (!) 125 (!) 125 (!) 122   Resp: (!) 23 (!) 21 (!) 26   Temp:    98.4 F (36.9 C)  TempSrc:    Oral  SpO2: 94% 92% 93%   Weight:      Height:        General: appears to be stated age; alert, oriented Skin: warm, dry, no rash Head:  AT/[place] Mouth:  Oral mucosa membranes appear moist, normal dentition Neck: supple; trachea midline Heart: Tachycardic, but regular; did not appreciate any M/R/G Lungs: CTAB, did not appreciate any wheezes, rales, or rhonchi Abdomen: + BS; soft, ND, NT Vascular: 2+ pedal pulses b/l; 2+ radial pulses b/l Extremities: no peripheral edema, no muscle wasting Neuro: strength and sensation intact in upper and lower extremities b/l    Labs on Admission: I have personally reviewed following labs and imaging studies  CBC: Recent Labs  Lab 06/17/23 1902  WBC 7.5  HGB 12.9  HCT 39.0  MCV 92.6  PLT 176   Basic Metabolic Panel: Recent Labs  Lab  06/17/23 1902  NA 143  K 4.3  CL 109  CO2 20*  GLUCOSE 106*  BUN 22*  CREATININE 1.58*  CALCIUM  9.2   GFR: Estimated Creatinine Clearance: 48.4 mL/min (A) (by C-G formula based on SCr of 1.58 mg/dL (H)). Liver Function Tests: No results for input(s): AST, ALT,  ALKPHOS, BILITOT, PROT, ALBUMIN in the last 168 hours. No results for input(s): LIPASE, AMYLASE in the last 168 hours. No results for input(s): AMMONIA in the last 168 hours. Coagulation Profile: No results for input(s): INR, PROTIME in the last 168 hours. Cardiac Enzymes: No results for input(s): CKTOTAL, CKMB, CKMBINDEX, TROPONINI in the last 168 hours. BNP (last 3 results) No results for input(s): PROBNP in the last 8760 hours. HbA1C: No results for input(s): HGBA1C in the last 72 hours. CBG: Recent Labs  Lab 06/17/23 1953  GLUCAP 100*   Lipid Profile: No results for input(s): CHOL, HDL, LDLCALC, TRIG, CHOLHDL, LDLDIRECT in the last 72 hours. Thyroid  Function Tests: No results for input(s): TSH, T4TOTAL, FREET4, T3FREE, THYROIDAB in the last 72 hours. Anemia Panel: No results for input(s): VITAMINB12, FOLATE, FERRITIN, TIBC, IRON, RETICCTPCT in the last 72 hours. Urine analysis:    Component Value Date/Time   COLORURINE YELLOW 06/17/2023 2123   APPEARANCEUR HAZY (A) 06/17/2023 2123   LABSPEC 1.003 (L) 06/17/2023 2123   PHURINE 7.0 06/17/2023 2123   GLUCOSEU NEGATIVE 06/17/2023 2123   HGBUR SMALL (A) 06/17/2023 2123   BILIRUBINUR NEGATIVE 06/17/2023 2123   KETONESUR NEGATIVE 06/17/2023 2123   PROTEINUR NEGATIVE 06/17/2023 2123   UROBILINOGEN 0.2 03/11/2015 0910   NITRITE POSITIVE (A) 06/17/2023 2123   LEUKOCYTESUR LARGE (A) 06/17/2023 2123    Radiological Exams on Admission: MR LUMBAR SPINE WO CONTRAST Result Date: 06/17/2023 CLINICAL DATA:  Back trauma. Abnormal neurologic examination. Weakness. Can not walk. EXAM: MRI THORACIC AND  LUMBAR SPINE WITHOUT CONTRAST TECHNIQUE: Multiplanar and multiecho pulse sequences of the thoracic and lumbar spine were obtained without intravenous contrast. COMPARISON:  Prior MRI lumbar spine 08/25/2022. FINDINGS: MRI THORACIC SPINE FINDINGS Alignment:  Normal Vertebrae: Normal marrow signal.  No bone lesions or fractures. Cord:  Normal cord signal intensity.  No cord lesions or syrinx. Paraspinal and other soft tissues: No significant paraspinal findings. No paraspinal hematoma. No posterior mediastinal process or posterior lung abnormality. Disc levels: No thoracic disc protrusions, spinal or foraminal stenosis. Small perineural cysts or dilated nerve root sheaths are noted on the left at T9 and on the right at T11. MRI LUMBAR SPINE FINDINGS Segmentation: There are five lumbar type vertebral bodies. The last full intervertebral disc space is labeled L5-S1. This correlates with the prior MRI. Alignment:  Normal Vertebrae:  Normal marrow signal.  No bone lesions or fractures. Conus medullaris and cauda equina: Conus extends to the bottom of L1 level. Conus and cauda equina appear normal. Paraspinal and other soft tissues: Numerous bilateral renal cysts are noted. No change since CT scan of 2023. The aorta is normal in caliber. No retroperitoneal mass or adenopathy. Disc levels: T12-L1: Mild annular bulge and slight osteophytic ridging but no spinal or foraminal stenosis. L1-2: Mild facet disease but no disc protrusions, spinal or foraminal stenosis. L2-3: Mild facet disease but no spinal or foraminal stenosis. L3-4: Mild diffuse annular bulge, moderate facet disease and ligamentum flavum thickening contributing to mild bilateral lateral recess stenosis and early spinal stenosis. No foraminal stenosis. L4-5: Bulging annulus, osteophytic ridging and facet disease contributing to moderate right and mild left lateral recess stenosis. No spinal or foraminal stenosis. L5-S1: Bulging annulus, osteophytic ridging and  facet disease with mild bilateral lateral recess encroachment. No significant spinal or foraminal stenosis. IMPRESSION: 1. Normal MRI appearance of the thoracic spine and spinal cord. 2. No thoracic disc protrusions, spinal or foraminal stenosis. 3. Small perineural cysts or dilated nerve root sheaths on  the left at T9 and on the right at T11. 4. Mild bilateral lateral recess stenosis and early spinal stenosis at L3-4. 5. Moderate right and mild left lateral recess stenosis at L4-5. 6. Mild bilateral lateral recess encroachment at L5-S1. Electronically Signed   By: MYRTIS Stammer M.D.   On: 06/17/2023 23:13   MR THORACIC SPINE WO CONTRAST Result Date: 06/17/2023 CLINICAL DATA:  Back trauma. Abnormal neurologic examination. Weakness. Can not walk. EXAM: MRI THORACIC AND LUMBAR SPINE WITHOUT CONTRAST TECHNIQUE: Multiplanar and multiecho pulse sequences of the thoracic and lumbar spine were obtained without intravenous contrast. COMPARISON:  Prior MRI lumbar spine 08/25/2022. FINDINGS: MRI THORACIC SPINE FINDINGS Alignment:  Normal Vertebrae: Normal marrow signal.  No bone lesions or fractures. Cord:  Normal cord signal intensity.  No cord lesions or syrinx. Paraspinal and other soft tissues: No significant paraspinal findings. No paraspinal hematoma. No posterior mediastinal process or posterior lung abnormality. Disc levels: No thoracic disc protrusions, spinal or foraminal stenosis. Small perineural cysts or dilated nerve root sheaths are noted on the left at T9 and on the right at T11. MRI LUMBAR SPINE FINDINGS Segmentation: There are five lumbar type vertebral bodies. The last full intervertebral disc space is labeled L5-S1. This correlates with the prior MRI. Alignment:  Normal Vertebrae:  Normal marrow signal.  No bone lesions or fractures. Conus medullaris and cauda equina: Conus extends to the bottom of L1 level. Conus and cauda equina appear normal. Paraspinal and other soft tissues: Numerous bilateral  renal cysts are noted. No change since CT scan of 2023. The aorta is normal in caliber. No retroperitoneal mass or adenopathy. Disc levels: T12-L1: Mild annular bulge and slight osteophytic ridging but no spinal or foraminal stenosis. L1-2: Mild facet disease but no disc protrusions, spinal or foraminal stenosis. L2-3: Mild facet disease but no spinal or foraminal stenosis. L3-4: Mild diffuse annular bulge, moderate facet disease and ligamentum flavum thickening contributing to mild bilateral lateral recess stenosis and early spinal stenosis. No foraminal stenosis. L4-5: Bulging annulus, osteophytic ridging and facet disease contributing to moderate right and mild left lateral recess stenosis. No spinal or foraminal stenosis. L5-S1: Bulging annulus, osteophytic ridging and facet disease with mild bilateral lateral recess encroachment. No significant spinal or foraminal stenosis. IMPRESSION: 1. Normal MRI appearance of the thoracic spine and spinal cord. 2. No thoracic disc protrusions, spinal or foraminal stenosis. 3. Small perineural cysts or dilated nerve root sheaths on the left at T9 and on the right at T11. 4. Mild bilateral lateral recess stenosis and early spinal stenosis at L3-4. 5. Moderate right and mild left lateral recess stenosis at L4-5. 6. Mild bilateral lateral recess encroachment at L5-S1. Electronically Signed   By: MYRTIS Stammer M.D.   On: 06/17/2023 23:13   CT HEAD WO CONTRAST Result Date: 06/17/2023 CLINICAL DATA:  Blunt facial trauma, fall striking head, anti coagulation. EXAM: CT HEAD WITHOUT CONTRAST TECHNIQUE: Contiguous axial images were obtained from the base of the skull through the vertex without intravenous contrast. RADIATION DOSE REDUCTION: This exam was performed according to the departmental dose-optimization program which includes automated exposure control, adjustment of the mA and/or kV according to patient size and/or use of iterative reconstruction technique. COMPARISON:   None Available. FINDINGS: Brain: The brainstem, cerebellum, cerebral peduncles, thalami, basal ganglia, basilar cisterns, and ventricular system appear within normal limits. No intracranial hemorrhage, mass lesion, or acute CVA. Vascular: Unremarkable Skull: Unremarkable Sinuses/Orbits: Unremarkable Other: No supplemental non-categorized findings. IMPRESSION: 1. Negative head CT. Electronically Signed  By: Ryan Salvage M.D.   On: 06/17/2023 20:33   DG Knee Complete 4 Views Right Result Date: 06/17/2023 CLINICAL DATA:  Fall, right knee pain EXAM: RIGHT KNEE - COMPLETE 4+ VIEW COMPARISON:  None Available. FINDINGS: No fracture or dislocation is seen. Mild tricompartmental degenerative changes, most prominent in the patellofemoral compartment. Visualized soft tissues are within normal limits. No suprapatellar knee joint effusion. IMPRESSION: Negative. Electronically Signed   By: Pinkie Pebbles M.D.   On: 06/17/2023 19:38      Assessment/Plan    Principal Problem:   Acute cystitis Active Problems:   Bipolar disorder (HCC)   HLD (hyperlipidemia)   Essential hypertension   CKD stage 3b, GFR 30-44 ml/min (HCC)   Peripheral polyneuropathy   Generalized weakness   Recurrent falls   Chronic low back pain   History of COPD   DVT (deep venous thrombosis) (HCC)    #) Acute cystitis: In the setting of 10 days of persistent dysuria, with the symptoms refractory to completed course of unclear outpatient oral antibiotic, and this evening's urinalysis that shows significant pyuria, large leukocyte esterase, nitrate positive findings in the absence of squamous epithelial cells to suggest a contaminated specimen.  Given the findings on this evening's urinalysis concomitant with the persistence of her dysuria, presentation appears suggestive of persistence of acute cystitis in the setting of failure of outpatient antibiotics.  No clinical evidence to suggest pyelonephritis at this time.  Of note, in  the absence of objective fever and in the absence of leukocytosis, SIRS criteria are not met for sepsis at this time.  No evidence of hypotension.  Received Rocephin  in the ED, which will be continued for now, will closely monitoring for results of urine culture/sensitivities.  Plan: Add on urine culture.  Continue Rocephin , as above.  Repeat CBC in the morning.                  #) Generalized weakness: 1 week duration of generalized weakness, in the absence of any evidence of acute focal neurologic deficits, including no evidence of acute focal weakness to suggest acute CVA.  Of note, today CT head shows no evidence of acute intracranial process, as above. suspect contribution from physiologic stress stemming from presenting acute cystitis in the setting of suspected failure of outpatient oral antibiotics.  No e/o additional infectious process at this time.  Appears to have resulted in her recurrent falls over the course of the last week, as further detailed below.  Will further eval for any additional contributions from endocrine/metabolic sources, as detailed below.   Plan: work-up and management of presenting acute cystitis, as described above. PT/OT consults ordered for the AM. Fall precautions. CMP/CBC in the AM. Check TSH, serum Mg level. Check B12, CPK level.                  #) Recurrent ground level mechanical falls: The patient reports recurrent ground-level mechanical falls over the course of the last week, in which she is tripped while ambulating at home.  This appears to be influenced by predisposition as a result of generalized weakness in the setting of acute cystitis with failure of outpatient oral antibiotics, as above, superimposed on her history of peripheral polyneuropathy.  This presentation has not been associated with any loss of consciousness, and the patient conveys that she has not hit her head as a component of these falls.  She is currently  anticoagulated on Eliquis .  In the setting, CT head showed  no evidence of acute intracranial process, including no evidence of intracranial hemorrhage.  No evidence of acute traumatic injury, including no evidence of acute fracture.  Plan: Fall precautions. Repeat CMP and CBC with differential in the morning.  PT/OT consult ordered.  Further evaluation management of presenting acute cystitis resulting in generalized weakness, as above.  Check B12 level.                   #) Chronic low back pain: Documented history of such, for which she has been following with neurosurgery.  Potential contributions from MRI evidence of multilevel mild to moderate neural foraminal stenosis involving the lumbar spine, as well as evidence of early spinal stenosis, as further detailed above, without any evidence of central cord impingement or any red flag symptoms.  On scheduled oxycodone  IR twice daily as an outpatient.  Feels that her low back pain is slightly more intense over the last few days in the setting of her recurrent falls.  As a result, we will slightly increase her home oxycodone , but render it on a as needed basis, as below.  Plan: Oxycodone  5 mg p.o. every 6 hours as needed for pain..  Acetaminophen .  PT/OT consults ordered for the morning.  Lidocaine  patch ordered.  Resume outpatient prn Flexeril .               #) COPD: Documented history thereof, without clinical evidence of acute exacerbation at this time.   Outpatient respiratory regimen includes the following: Prn albuterol  inhaler.   Plan: Prn albuterol  nebulizer. Check CMP and serum magnesium level in the AM.                    #) Essential Hypertension: documented h/o such, with outpatient antihypertensive regimen including Lopressor  25 mg p.o. twice daily.  SBP's in the ED today: 1 teens to 120s mmHg. she has not yet had her evening dose of beta-blocker, and it is noted that she has been mildly  tachycardic in the ED.  Plan: Close monitoring of subsequent BP via routine VS. resume outpatient Lopressor .  Monitor on telemetry.                   #) Hyperlipidemia: documented h/o such. On high intensity rosuvastatin  as outpatient.   Plan: continue home statin.  Follow for result of CPK level.               #) Bipolar disorder: Documented history of such, with outpatient central acting regimen that includes Depakote .  She is also noted to be on amitriptyline , in part for her history of peripheral polyneuropathy.  Plan: Resume outpatient Depakote , amitriptyline .                 #) CKD Stage 3B: Documented history of such, with baseline creatinine 1.5-2.1, with presenting creatinine consistent with this baseline.    Plan: Monitor strict I's and O's and daily weights.  Attempt to avoid nephrotoxic agents.  CMP/magnesium level in the AM.                 #) History of DVTs involving the bilateral lower extremities: Documented history of such, for which the patient is anticoagulated on Eliquis .  Most recent dose occurred on the morning of 06/17/2023.  Plan: Resume outpatient Eliquis .                 #) Peripheral polyneuropathy: Documented history of such.  Does not appear to have an underlying  diagnosis of diabetes.  Her outpatient neuropathy regimen includes gabapentin , amitriptyline .  Plan: Resume outpatient amitriptyline  and gabapentin .  Follow for result of B12 level, as above.      DVT prophylaxis: SCD's   Code Status: Full code Family Communication: none Disposition Plan: Per Rounding Team Consults called: none;  Admission status: Inpatient    I SPENT GREATER THAN 75  MINUTES IN CLINICAL CARE TIME/MEDICAL DECISION-MAKING IN COMPLETING THIS ADMISSION.     Eva NOVAK Adamaris King DO [facility] From 7PM - 7AM   06/17/2023, 11:44 PM

## 2023-06-17 NOTE — ED Notes (Signed)
 Patient transported to MRI

## 2023-06-17 NOTE — ED Provider Notes (Signed)
 [place] EMERGENCY DEPARTMENT AT [facility] Provider Note   CSN: [id number] Arrival date & time: 06/17/23  1821     History  Chief Complaint  Patient presents with   Back Pain   Fall    Dana [name] is a 63 y.o. female.  The history is provided by the patient and medical records. No language interpreter was used.  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  Patient reports since she had back surgery by her neurosurgeon in December of last year she has had recurrent pain to her back as well as having weakness to both lower legs.  Due to her weakness, she has had multiple falls.  Most recent fall was today while she was trying to open the door to let her dogs out her legs gave out and she fell to the ground striking her right knee against the ground.  She did not hit her head or loss of consciousness.  She normally use a walker to walk.  She reports several days prior she did fall when her legs gave out and she struck her head and her head back against the ground no loss of consciousness at that time but she still having some pain.  She did reach out to her neurosurgeon.  She does have an appointment to follow-up in 2 days but does not think she will be able to make it due to the increment weather outside including snow.  She is also having MRI scheduled for her entire spine by her neurosurgeon.  It is scheduled in the next 4 days but patient request to have it done while she is in the ER today.  Otherwise she denies any fever chills no bowel bladder incontinence or saddle anesthesia.  No complaint of nausea or vomiting new focal numbness or new focal weakness.  Home Medications Prior to Admission medications   Medication Sig Start Date End Date Taking? Authorizing Provider  Acetaminophen  (TYLENOL ) 325 MG CAPS 3  capsules    [provider]  ALPRAZolam  (XANAX ) 0.5 MG tablet Take 1 tablet (0.5 mg total) by mouth 2 (two) times daily as needed for anxiety. 01/19/22 11/14/22  Franchot Raisin, PMHNP  amitriptyline  (ELAVIL ) 25 MG tablet Take 25 mg by mouth at bedtime.    [provider]  cefdinir  (OMNICEF ) 300 MG capsule Take 1 capsule (300 mg total) by mouth 2 (two) times daily. 04/16/23   Geiple, Joshua, PA-C  cyclobenzaprine  (FLEXERIL ) 10 MG tablet Take 10 mg by mouth 3 (three) times daily as needed for muscle spasms. 08/24/22   [provider]  divalproex  (DEPAKOTE  ER) 250 MG 24 hr tablet Take 1 tablet (250 mg total) by mouth daily. Take with a 500 mg tablet to equal total dose of 750 mg 04/24/23 10/21/23  Franchot Raisin, PMHNP  divalproex  (DEPAKOTE  ER) 500 MG 24 hr tablet Take 1 tablet (500 mg total) by mouth at bedtime. Take with a 250 mg tablet to equal total dose of 750 mg 04/24/23 10/21/23  Franchot Raisin, PMHNP  ELIQUIS  2.5 MG TABS tablet TAKE 1 TABLET(2.5 MG) BY MOUTH TWICE DAILY 04/10/23   Dorsey, John T IV, MD  esomeprazole (NEXIUM) 40 MG capsule Take 40 mg by mouth 2 (two) times daily before a meal. 09/08/19   [provider]  fluticasone (FLONASE) 50 MCG/ACT nasal spray  Place 1 spray into the nose daily. 08/09/22   [provider]  furosemide (LASIX) 20 MG tablet Take 20 mg by mouth every other day. Patient not taking: Reported on 04/24/2023    [provider]  gabapentin  (NEURONTIN ) 300 MG capsule Take 300 mg by mouth 3 (three) times daily. 03/30/22   [provider]  hydrOXYzine (ATARAX) 25 MG tablet Take 25 mg by mouth at bedtime. 04/14/23 08/12/23  [provider]  methylPREDNISolone  (MEDROL  DOSEPAK) 4 MG TBPK tablet Take per package instructions 05/14/23   Roemhildt, Lorin T, PA-C  metoprolol  tartrate (LOPRESSOR ) 25 MG tablet TAKE 1 TABLET(25 MG) BY MOUTH TWICE DAILY 04/25/23   Lavona Agent, MD  ondansetron  (ZOFRAN ) 4 MG tablet as  needed. 01/30/20   [provider]  oxyCODONE  (OXY IR/ROXICODONE ) 5 MG immediate release tablet Take 5 mg by mouth every 12 (twelve) hours as needed.    [provider]  rizatriptan (MAXALT) 10 MG tablet Take by mouth. 01/20/21   [provider]  rosuvastatin  (CRESTOR ) 40 MG tablet Take 1 tablet (40 mg total) by mouth daily. 03/21/23 06/19/23  Lavona Agent, MD  tamsulosin (FLOMAX) 0.4 MG CAPS capsule Take 0.4 mg by mouth daily. 07/18/21   [provider]      Allergies    Anesthetics, amide; Other; Oxybutynin; Hyoscyamine; and Codeine    Review of Systems   Review of Systems  Musculoskeletal:  Positive for back pain.  All other systems reviewed and are negative.   Physical Exam Updated Vital Signs BP (!) 157/98 (BP Location: Right Arm)   Pulse (!) 118   Temp 99.2 F (37.3 C) (Oral)   Resp 20   Ht 5' 6 (1.676 m)   Wt 113.4 kg   SpO2 96%   BMI 40.35 kg/m  Physical Exam Vitals and nursing note reviewed.  Constitutional:      General: She is not in acute distress.    Appearance: She is well-developed. She is obese.  HENT:     Head: Normocephalic and atraumatic.  Eyes:     Conjunctiva/sclera: Conjunctivae normal.  Cardiovascular:     Rate and Rhythm: Tachycardia present.     Pulses: Normal pulses.     Heart sounds: Normal heart sounds.  Pulmonary:     Effort: Pulmonary effort is normal.     Breath sounds: No wheezing, rhonchi or rales.  Abdominal:     Palpations: Abdomen is soft.     Tenderness: There is no abdominal tenderness.  Musculoskeletal:        General: Tenderness (Mild tenderness noted to midline spine without any crepitus or step-off or bruising noted.) present.     Cervical back: Neck supple.     Comments: Right knee: Abrasion noted to anterior knee with tenderness to palpation.  Able to flex and extend knee.  Faint ecchymosis noted to proximal mid anterior tib-fib mild tender to palpation.  Skin:    Findings: No rash.   Neurological:     Mental Status: She is alert. Mental status is at baseline.     Comments: Equal strength to bilateral extremities with intact distal pedal pulses.  Psychiatric:        Mood and Affect: Mood normal.     ED Results / Procedures / Treatments   Labs (all labs ordered are listed, but only abnormal results are displayed) Labs Reviewed  BASIC METABOLIC PANEL - Abnormal; Notable for the following components:      Result Value   CO2  20 (*)    Glucose, Bld 106 (*)    BUN 22 (*)    Creatinine, Ser 1.58 (*)    GFR, Estimated 37 (*)    All other components within normal limits  URINALYSIS, ROUTINE W REFLEX MICROSCOPIC - Abnormal; Notable for the following components:   APPearance HAZY (*)    Specific Gravity, Urine 1.003 (*)    Hgb urine dipstick SMALL (*)    Nitrite POSITIVE (*)    Leukocytes,Ua LARGE (*)    Bacteria, UA RARE (*)    All other components within normal limits  CBG MONITORING, ED - Abnormal; Notable for the following components:   Glucose-Capillary 100 (*)    All other components within normal limits  CBC    EKG None ED ECG REPORT   Date: 06/17/2023  Rate: 121  Rhythm: sinus tachycardia  QRS Axis: normal  Intervals: normal  ST/T Wave abnormalities: normal  Conduction Disutrbances:none  Narrative Interpretation:   Old EKG Reviewed: changes noted  I have personally reviewed the EKG tracing and agree with the computerized printout as noted.   Radiology MR LUMBAR SPINE WO CONTRAST Result Date: 06/17/2023 CLINICAL DATA:  Back trauma. Abnormal neurologic examination. Weakness. Can not walk. EXAM: MRI THORACIC AND LUMBAR SPINE WITHOUT CONTRAST TECHNIQUE: Multiplanar and multiecho pulse sequences of the thoracic and lumbar spine were obtained without intravenous contrast. COMPARISON:  Prior MRI lumbar spine 08/25/2022. FINDINGS: MRI THORACIC SPINE FINDINGS Alignment:  Normal Vertebrae: Normal marrow signal.  No bone lesions or fractures. Cord:  Normal  cord signal intensity.  No cord lesions or syrinx. Paraspinal and other soft tissues: No significant paraspinal findings. No paraspinal hematoma. No posterior mediastinal process or posterior lung abnormality. Disc levels: No thoracic disc protrusions, spinal or foraminal stenosis. Small perineural cysts or dilated nerve root sheaths are noted on the left at T9 and on the right at T11. MRI LUMBAR SPINE FINDINGS Segmentation: There are five lumbar type vertebral bodies. The last full intervertebral disc space is labeled L5-S1. This correlates with the prior MRI. Alignment:  Normal Vertebrae:  Normal marrow signal.  No bone lesions or fractures. Conus medullaris and cauda equina: Conus extends to the bottom of L1 level. Conus and cauda equina appear normal. Paraspinal and other soft tissues: Numerous bilateral renal cysts are noted. No change since CT scan of 2023. The aorta is normal in caliber. No retroperitoneal mass or adenopathy. Disc levels: T12-L1: Mild annular bulge and slight osteophytic ridging but no spinal or foraminal stenosis. L1-2: Mild facet disease but no disc protrusions, spinal or foraminal stenosis. L2-3: Mild facet disease but no spinal or foraminal stenosis. L3-4: Mild diffuse annular bulge, moderate facet disease and ligamentum flavum thickening contributing to mild bilateral lateral recess stenosis and early spinal stenosis. No foraminal stenosis. L4-5: Bulging annulus, osteophytic ridging and facet disease contributing to moderate right and mild left lateral recess stenosis. No spinal or foraminal stenosis. L5-S1: Bulging annulus, osteophytic ridging and facet disease with mild bilateral lateral recess encroachment. No significant spinal or foraminal stenosis. IMPRESSION: 1. Normal MRI appearance of the thoracic spine and spinal cord. 2. No thoracic disc protrusions, spinal or foraminal stenosis. 3. Small perineural cysts or dilated nerve root sheaths on the left at T9 and on the right at T11.  4. Mild bilateral lateral recess stenosis and early spinal stenosis at L3-4. 5. Moderate right and mild left lateral recess stenosis at L4-5. 6. Mild bilateral lateral recess encroachment at L5-S1. Electronically Signed   By: SHAUNNA.  Gallerani M.D.   On: 06/17/2023 23:13   MR THORACIC SPINE WO CONTRAST Result Date: 06/17/2023 CLINICAL DATA:  Back trauma. Abnormal neurologic examination. Weakness. Can not walk. EXAM: MRI THORACIC AND LUMBAR SPINE WITHOUT CONTRAST TECHNIQUE: Multiplanar and multiecho pulse sequences of the thoracic and lumbar spine were obtained without intravenous contrast. COMPARISON:  Prior MRI lumbar spine 08/25/2022. FINDINGS: MRI THORACIC SPINE FINDINGS Alignment:  Normal Vertebrae: Normal marrow signal.  No bone lesions or fractures. Cord:  Normal cord signal intensity.  No cord lesions or syrinx. Paraspinal and other soft tissues: No significant paraspinal findings. No paraspinal hematoma. No posterior mediastinal process or posterior lung abnormality. Disc levels: No thoracic disc protrusions, spinal or foraminal stenosis. Small perineural cysts or dilated nerve root sheaths are noted on the left at T9 and on the right at T11. MRI LUMBAR SPINE FINDINGS Segmentation: There are five lumbar type vertebral bodies. The last full intervertebral disc space is labeled L5-S1. This correlates with the prior MRI. Alignment:  Normal Vertebrae:  Normal marrow signal.  No bone lesions or fractures. Conus medullaris and cauda equina: Conus extends to the bottom of L1 level. Conus and cauda equina appear normal. Paraspinal and other soft tissues: Numerous bilateral renal cysts are noted. No change since CT scan of 2023. The aorta is normal in caliber. No retroperitoneal mass or adenopathy. Disc levels: T12-L1: Mild annular bulge and slight osteophytic ridging but no spinal or foraminal stenosis. L1-2: Mild facet disease but no disc protrusions, spinal or foraminal stenosis. L2-3: Mild facet disease but no  spinal or foraminal stenosis. L3-4: Mild diffuse annular bulge, moderate facet disease and ligamentum flavum thickening contributing to mild bilateral lateral recess stenosis and early spinal stenosis. No foraminal stenosis. L4-5: Bulging annulus, osteophytic ridging and facet disease contributing to moderate right and mild left lateral recess stenosis. No spinal or foraminal stenosis. L5-S1: Bulging annulus, osteophytic ridging and facet disease with mild bilateral lateral recess encroachment. No significant spinal or foraminal stenosis. IMPRESSION: 1. Normal MRI appearance of the thoracic spine and spinal cord. 2. No thoracic disc protrusions, spinal or foraminal stenosis. 3. Small perineural cysts or dilated nerve root sheaths on the left at T9 and on the right at T11. 4. Mild bilateral lateral recess stenosis and early spinal stenosis at L3-4. 5. Moderate right and mild left lateral recess stenosis at L4-5. 6. Mild bilateral lateral recess encroachment at L5-S1. Electronically Signed   By: MYRTIS Stammer M.D.   On: 06/17/2023 23:13   CT HEAD WO CONTRAST Result Date: 06/17/2023 CLINICAL DATA:  Blunt facial trauma, fall striking head, anti coagulation. EXAM: CT HEAD WITHOUT CONTRAST TECHNIQUE: Contiguous axial images were obtained from the base of the skull through the vertex without intravenous contrast. RADIATION DOSE REDUCTION: This exam was performed according to the departmental dose-optimization program which includes automated exposure control, adjustment of the mA and/or kV according to patient size and/or use of iterative reconstruction technique. COMPARISON:  None Available. FINDINGS: Brain: The brainstem, cerebellum, cerebral peduncles, thalami, basal ganglia, basilar cisterns, and ventricular system appear within normal limits. No intracranial hemorrhage, mass lesion, or acute CVA. Vascular: Unremarkable Skull: Unremarkable Sinuses/Orbits: Unremarkable Other: No supplemental non-categorized findings.  IMPRESSION: 1. Negative head CT. Electronically Signed   By: Ryan Salvage M.D.   On: 06/17/2023 20:33   DG Knee Complete 4 Views Right Result Date: 06/17/2023 CLINICAL DATA:  Fall, right knee pain EXAM: RIGHT KNEE - COMPLETE 4+ VIEW COMPARISON:  None Available. FINDINGS: No fracture or dislocation is seen.  Mild tricompartmental degenerative changes, most prominent in the patellofemoral compartment. Visualized soft tissues are within normal limits. No suprapatellar knee joint effusion. IMPRESSION: Negative. Electronically Signed   By: Pinkie Pebbles M.D.   On: 06/17/2023 19:38    Procedures Procedures    Medications Ordered in ED Medications  cefTRIAXone  (ROCEPHIN ) 1 g in sodium chloride  0.9 % 100 mL IVPB (has no administration in time range)  oxyCODONE  (Oxy IR/ROXICODONE ) immediate release tablet 10 mg (10 mg Oral Given 06/17/23 1945)  sodium chloride  0.9 % bolus 1,000 mL (1,000 mLs Intravenous New Bag/Given 06/17/23 2104)    ED Course/ Medical Decision Making/ A&P                                 Medical Decision Making Amount and/or Complexity of Data Reviewed Labs: ordered. Radiology: ordered.  Risk Prescription drug management.   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  Patient reports since she had back surgery by her neurosurgeon in December of last year she has had recurrent pain to her back as well as having weakness to both lower legs.  Due to her weakness, she has had multiple falls.  Most recent fall was today while she was trying to open the door to let her dogs out her legs gave out and she fell to the ground striking her right knee against the  ground.  She did not hit her head or loss of consciousness.  She normally use a walker to walk.  She reports several days prior she did fall when her legs gave out and she struck her head and her head back against the ground no loss of consciousness at that time but she still having some pain.  She did reach out to her neurosurgeon.  She does have an appointment to follow-up in 2 days but does not think she will be able to make it due to the increment weather outside including snow.  She is also having MRI scheduled for her entire spine by her neurosurgeon.  It is scheduled in the next 4 days but patient request to have it done while she is in the ER today.  Otherwise she denies any fever chills no bowel bladder incontinence or saddle anesthesia.  No complaint of nausea or vomiting new focal numbness or new focal weakness.  Exam notable for tenderness to midline spine at the thoracic region without any bruising or ecchymosis and no step-off or deformity noted.  Tenderness is mild.  She also has anterior right knee tenderness on palpation with some bruising and abrasion noted.  No signs of infection.  Patient is able to move all 4 extremities with equal effort.  She is neurovascularly intact.  However, upon standing, she is having difficulty supporting her weight or walking.    -Labs ordered, independently viewed and interpreted by me.  Labs remarkable for Cr 1.58 which is an improvement from prior.  UA showing UTI, will treat with rocephin .  -The patient was maintained on a cardiac monitor.  I personally viewed  and interpreted the cardiac monitored which showed an underlying rhythm of: sinus tachycardia -Imaging independently viewed and interpreted by me and I agree with radiologist's interpretation.  Result remarkable for head CT without acute finding. R knee xray unremarkable.  MRI of thoracic and lumbar spine pending -This patient presents to the ED for concern of recurrent falls, this involves an  extensive number of treatment options, and is a complaint that carries with it a high risk of complications and morbidity.  The differential diagnosis includes cauda equina, spinal stenosis, anemia, mechanical falls, UTI -Co morbidities that complicate the patient evaluation includes DVT, neuropathy, bipolar, copd -Treatment includes IVF, oxycocone -Reevaluation of the patient after these medicines showed that the patient improved -PCP office notes or outside notes reviewed -Discussion with attending Dr. Dasie -Escalation to admission/observation considered: patient preference is to be admitted   9:27 PM Patient with recurrent falls.  She also endorsed having some urine incontinence for several weeks since she has been falling due to bilateral leg weakness.  She is scheduled to have an MRI of her entire spine according to the patient and this was supposed to get done in 4 days.  Unfortunately at this time she is unable to ambulate due to progressive weakness of her lower extremities despite using a walker.  She also remains tachycardic with heart rate in the 120s-130s.  IV fluid given, due to concerns of potential cauda equina, will obtain an MRI of the thoracic and lumbar spine.  She is without any weakness of her upper extremities.  Care discussed with Dr. Dasie.    MRI of the thoracic and lumbar spine without any acute concerning finding.  Urinalysis did show evidence of UTI.  Patient states she has had increased urinary frequency and was treated with an antibiotic for UTI over a week ago without improvement of her symptoms.  Given recurrent falls, underlying UTI, and patient is consistently tachycardic with a heart rate of 122 will consult for possible admission.  Patient is afebrile.  Does not meet sepsis criteria.  11:40 PM Appreciate consult Tat[facility] from [facility], Dr. Marcene who agrees to admit patient.        Final Clinical Impression(s) / ED Diagnoses Final diagnoses:   Lower urinary tract infectious disease  Recurrent falls  Tachycardia    Rx / DC Orders ED Discharge Orders     None         Nivia Colon, PA-C 06/17/23 2340    Dasie Faden, MD 06/19/23 1454

## 2023-06-18 ENCOUNTER — Inpatient Hospital Stay (HOSPITAL_COMMUNITY): Payer: Medicare PPO

## 2023-06-18 ENCOUNTER — Encounter (HOSPITAL_COMMUNITY): Payer: Self-pay | Admitting: Internal Medicine

## 2023-06-18 DIAGNOSIS — I82409 Acute embolism and thrombosis of unspecified deep veins of unspecified lower extremity: Secondary | ICD-10-CM | POA: Diagnosis present

## 2023-06-18 DIAGNOSIS — R296 Repeated falls: Secondary | ICD-10-CM

## 2023-06-18 DIAGNOSIS — G8929 Other chronic pain: Secondary | ICD-10-CM | POA: Diagnosis present

## 2023-06-18 DIAGNOSIS — Z8709 Personal history of other diseases of the respiratory system: Secondary | ICD-10-CM

## 2023-06-18 DIAGNOSIS — R531 Weakness: Secondary | ICD-10-CM

## 2023-06-18 LAB — CBC WITH DIFFERENTIAL/PLATELET
Abs Immature Granulocytes: 0.02 10*3/uL (ref 0.00–0.07)
Basophils Absolute: 0 10*3/uL (ref 0.0–0.1)
Basophils Relative: 1 %
Eosinophils Absolute: 0 10*3/uL (ref 0.0–0.5)
Eosinophils Relative: 0 %
HCT: 36 % (ref 36.0–46.0)
Hemoglobin: 11.9 g/dL — ABNORMAL LOW (ref 12.0–15.0)
Immature Granulocytes: 0 %
Lymphocytes Relative: 21 %
Lymphs Abs: 1.3 10*3/uL (ref 0.7–4.0)
MCH: 30.1 pg (ref 26.0–34.0)
MCHC: 33.1 g/dL (ref 30.0–36.0)
MCV: 91.1 fL (ref 80.0–100.0)
Monocytes Absolute: 0.7 10*3/uL (ref 0.1–1.0)
Monocytes Relative: 12 %
Neutro Abs: 4 10*3/uL (ref 1.7–7.7)
Neutrophils Relative %: 66 %
Platelets: 182 10*3/uL (ref 150–400)
RBC: 3.95 MIL/uL (ref 3.87–5.11)
RDW: 13.8 % (ref 11.5–15.5)
WBC: 6.1 10*3/uL (ref 4.0–10.5)
nRBC: 0 % (ref 0.0–0.2)

## 2023-06-18 LAB — VITAMIN B12: Vitamin B-12: 387 pg/mL (ref 180–914)

## 2023-06-18 LAB — COMPREHENSIVE METABOLIC PANEL
ALT: 16 U/L (ref 0–44)
AST: 12 U/L — ABNORMAL LOW (ref 15–41)
Albumin: 3 g/dL — ABNORMAL LOW (ref 3.5–5.0)
Alkaline Phosphatase: 67 U/L (ref 38–126)
Anion gap: 9 (ref 5–15)
BUN: 22 mg/dL — ABNORMAL HIGH (ref 6–20)
CO2: 21 mmol/L — ABNORMAL LOW (ref 22–32)
Calcium: 9.5 mg/dL (ref 8.9–10.3)
Chloride: 114 mmol/L — ABNORMAL HIGH (ref 98–111)
Creatinine, Ser: 2.14 mg/dL — ABNORMAL HIGH (ref 0.44–1.00)
GFR, Estimated: 26 mL/min — ABNORMAL LOW (ref >60)
Glucose, Bld: 112 mg/dL — ABNORMAL HIGH (ref 70–99)
Potassium: 4 mmol/L (ref 3.5–5.1)
Sodium: 144 mmol/L (ref 135–145)
Total Bilirubin: 0.5 mg/dL (ref 0.0–1.2)
Total Protein: 6.8 g/dL (ref 6.5–8.1)

## 2023-06-18 LAB — CK: Total CK: 22 U/L — ABNORMAL LOW (ref 38–234)

## 2023-06-18 LAB — MAGNESIUM: Magnesium: 2.3 mg/dL (ref 1.7–2.4)

## 2023-06-18 LAB — TSH: TSH: 1.026 u[IU]/mL (ref 0.350–4.500)

## 2023-06-18 MED ORDER — NALOXONE HCL 0.4 MG/ML IJ SOLN: 0.4000 mg | INTRAMUSCULAR | Status: DC | PRN

## 2023-06-18 MED ORDER — LIDOCAINE 5 % EX PTCH
1.0000 | MEDICATED_PATCH | CUTANEOUS | Status: DC
  Administered 2023-06-18 – 2023-06-20 (×4): 1 via TRANSDERMAL
  Filled 2023-06-18 (×4): qty 1

## 2023-06-18 MED ORDER — CYCLOBENZAPRINE HCL 10 MG PO TABS: 10.0000 mg | ORAL_TABLET | Freq: Three times a day (TID) | ORAL | Status: DC | PRN

## 2023-06-18 MED ORDER — DIVALPROEX SODIUM ER 500 MG PO TB24
500.0000 mg | ORAL_TABLET | Freq: Every day | ORAL | Status: DC
  Administered 2023-06-18 – 2023-06-20 (×4): 500 mg via ORAL
  Filled 2023-06-18 (×4): qty 1

## 2023-06-18 MED ORDER — ROSUVASTATIN CALCIUM 20 MG PO TABS
40.0000 mg | ORAL_TABLET | Freq: Every day | ORAL | Status: DC
  Administered 2023-06-18 – 2023-06-21 (×4): 40 mg via ORAL
  Filled 2023-06-18 (×4): qty 2

## 2023-06-18 MED ORDER — PANTOPRAZOLE SODIUM 40 MG PO TBEC
40.0000 mg | DELAYED_RELEASE_TABLET | Freq: Every day | ORAL | Status: DC
  Administered 2023-06-18 – 2023-06-19 (×2): 40 mg via ORAL
  Filled 2023-06-18 (×2): qty 1

## 2023-06-18 MED ORDER — CYANOCOBALAMIN 1000 MCG/ML IJ SOLN
1000.0000 ug | Freq: Once | INTRAMUSCULAR | Status: AC
  Administered 2023-06-18: 1000 ug via SUBCUTANEOUS
  Filled 2023-06-18: qty 1

## 2023-06-18 MED ORDER — DIVALPROEX SODIUM ER 250 MG PO TB24
250.0000 mg | ORAL_TABLET | Freq: Every day | ORAL | Status: DC
  Administered 2023-06-18 – 2023-06-21 (×4): 250 mg via ORAL
  Filled 2023-06-18 (×4): qty 1

## 2023-06-18 MED ORDER — LACTATED RINGERS IV SOLN: INTRAVENOUS | Status: AC

## 2023-06-18 MED ORDER — OXYCODONE HCL 5 MG PO TABS
5.0000 mg | ORAL_TABLET | ORAL | Status: DC | PRN
  Administered 2023-06-18 – 2023-06-19 (×4): 5 mg via ORAL
  Filled 2023-06-18 (×4): qty 1

## 2023-06-18 MED ORDER — PREGABALIN 50 MG PO CAPS
50.0000 mg | ORAL_CAPSULE | Freq: Three times a day (TID) | ORAL | Status: DC
  Administered 2023-06-18 – 2023-06-19 (×3): 50 mg via ORAL
  Filled 2023-06-18 (×3): qty 1

## 2023-06-18 MED ORDER — ALPRAZOLAM 0.5 MG PO TABS: 0.5000 mg | ORAL_TABLET | Freq: Two times a day (BID) | ORAL | Status: DC | PRN

## 2023-06-18 MED ORDER — VITAMIN B-12 1000 MCG PO TABS
1000.0000 ug | ORAL_TABLET | Freq: Every day | ORAL | Status: DC
  Administered 2023-06-19 – 2023-06-21 (×3): 1000 ug via ORAL
  Filled 2023-06-18 (×3): qty 1

## 2023-06-18 MED ORDER — METOPROLOL TARTRATE 25 MG PO TABS
25.0000 mg | ORAL_TABLET | Freq: Two times a day (BID) | ORAL | Status: DC
  Administered 2023-06-18 – 2023-06-21 (×8): 25 mg via ORAL
  Filled 2023-06-18 (×8): qty 1

## 2023-06-18 MED ORDER — AMITRIPTYLINE HCL 25 MG PO TABS
25.0000 mg | ORAL_TABLET | Freq: Every day | ORAL | Status: DC
  Administered 2023-06-18 – 2023-06-19 (×3): 25 mg via ORAL
  Filled 2023-06-18 (×3): qty 1

## 2023-06-18 MED ORDER — ALBUTEROL SULFATE (2.5 MG/3ML) 0.083% IN NEBU: 2.5000 mg | INHALATION_SOLUTION | RESPIRATORY_TRACT | Status: DC | PRN

## 2023-06-18 MED ORDER — APIXABAN 2.5 MG PO TABS
2.5000 mg | ORAL_TABLET | Freq: Two times a day (BID) | ORAL | Status: DC
  Administered 2023-06-18 – 2023-06-21 (×8): 2.5 mg via ORAL
  Filled 2023-06-18 (×8): qty 1

## 2023-06-18 MED ORDER — GABAPENTIN 300 MG PO CAPS
300.0000 mg | ORAL_CAPSULE | Freq: Three times a day (TID) | ORAL | Status: DC
  Administered 2023-06-18: 300 mg via ORAL
  Filled 2023-06-18: qty 1

## 2023-06-18 MED ORDER — FOLIC ACID 1 MG PO TABS
1.0000 mg | ORAL_TABLET | Freq: Every day | ORAL | Status: DC
  Administered 2023-06-18 – 2023-06-21 (×4): 1 mg via ORAL
  Filled 2023-06-18 (×4): qty 1

## 2023-06-18 NOTE — ED Notes (Signed)
 Given report to [employer] via phone

## 2023-06-18 NOTE — Progress Notes (Signed)
 [facility] Progress Note Patient: Dana [name] FMW:[id number] DOB: [birth year] DOA: 06/17/2023  DOS: the patient was seen and examined on 06/18/2023  Brief Hospital Course: Patient with PMH of chronic back pain, sees neurosurgery and neurology outpatient, neuropathy, COPD, HTN, HLD, CKD 3B sees nephrology outpatient, DVT on Eliquis  presented to the hospital with complaints of recurrent fall. Patient reports she had some fever but no chills or burning urination or any other urinary symptoms. No nausea no vomiting. Admitted for cystitis. Suspect most likely suffering from worsening of her progressive neuropathy.  Assessment and Plan: UTI. Has history of E. coli UTI in the past. Urine has some nitrites. Currently on IV ceftriaxone . Follows up with urology outpatient as well. Monitor.  Recurrent fall. Chronic neuropathy. Chronic back pain. Sees Dr. Rawland outpatient. Recently had injections in her spine on both side. Per PT evaluation patient had some right lean and right-sided preference. On my evaluation bilateral upper extremity strength equal, extraocular muscle movement intact, no facial droop, no pronator drift, normal finger-nose-finger, normal sensation bilaterally to light touch, normal proprioception bilaterally. MRI T-spine and L-spine unremarkable for any significant cord abnormality or any acute finding. Right lean could be coming from her pain. Will check MRI brain to complete the workup.  Chronic pain. Acute coccygeal pain. Recurrent fall. Patient reports severe coccyx pain after her fall last Wednesday on 1/8. Will check x-ray hip and x-ray sacrum and monitor. Patient is on gabapentin .  Tells me that this is not adequate.  Will switch to Lyrica  and monitor. Also on oxycodone  which I will continue.  Mood disorder. On Depakote .  On Xanax .  Continue.  GERD. Continue PPI.  HLD. Continue Crestor  40 mg.  Reported history of COPD. Currently no evidence of  exacerbation. Continue as needed inhalers.  HTN. Blood pressure stable. On Lopressor .  Monitor on telemetry.  CKD 3B. Baseline serum creatinine around 1.5-1 2.1. Currently serum creatinine stable. Follows up with nephrology outpatient.  Monitor.  History of DVT. On Eliquis .  Continue.  With her recurrent fall unsure whether the patient is a good candidate for long-term anticoagulation but at least so far her CT of the head is negative for any acute intracranial bleed.  Obesity Class 3 Body mass index is 41.11 kg/m.  Placing the pt at higher risk of poor outcomes.    Subjective: No nausea no vomiting no fever no chills.  Reports severe coccygeal pain after fall.  Physical Exam: General: in Mild distress, No Rash Cardiovascular: S1 and S2 Present, No Murmur Respiratory: Good respiratory effort, Bilateral Air entry present. No Crackles, No wheezes Abdomen: Bowel Sound present, No tenderness Extremities: No edema Neuro: Alert and oriented x3, no new focal deficit  Data Reviewed: I have Reviewed nursing notes, Vitals, and Lab results. Since last encounter, pertinent lab results CBC and BMP   . I have ordered test including CBC and BMP  . I have ordered imaging MRI brain, x-ray hip, x-ray coccyx  .   Disposition: Status is: Inpatient Remains inpatient appropriate because: Monitor for pain control  apixaban  (ELIQUIS ) tablet 2.5 mg Start: 06/18/23 0045 SCDs Start: 06/17/23 2343 apixaban  (ELIQUIS ) tablet 2.5 mg   Family Communication: No one at bedside Level of care: Progressive   Vitals:   06/18/23 0629 06/18/23 0925 06/18/23 1029 06/18/23 1502  BP: 110/75 111/82 (!) 137/92 (!) 125/91  Pulse: 92 95 (!) 104 74  Resp:   18 19  Temp: 98.4 F (36.9 C)  97.9 F (36.6 C) 98.3 F (36.8  C)  TempSrc: Oral  Oral Oral  SpO2: 94%  95% 98%  Weight: 115.5 kg     Height:         Author: Yetta Blanch, MD 06/18/2023 5:15 PM  Please look on [email or web address] to find out who is on  call.

## 2023-06-18 NOTE — ED Notes (Signed)
 ED TO INPATIENT HANDOFF REPORT  ED Nurse Name and Phone #: Willistine Port RN  S Name/Age/Gender Dana [name] 63 y.o. female Room/Bed: WA06/WA06  Code Status   Code Status: Full Code  Home/SNF/Other Home Patient oriented to: self, place, time, and situation Is this baseline? Yes   Triage Complete: Triage complete  Chief Complaint Acute cystitis [N30.00]  Triage Note Pt BIB [facility] with reports of right knee pain and lower back pain after a fall today. Pt had a fall Wednesday hitting her head pt is on blood thinners and was not seen.    Allergies Allergies  Allergen Reactions   Anesthetics, Amide Nausea And Vomiting   Other    Oxybutynin Other (See Comments)   Hyoscyamine    Codeine Nausea And Vomiting and Nausea Only    Level of Care/Admitting Diagnosis ED Disposition     ED Disposition  Admit   Condition  --   Comment  Hospital Area: [facility] [[id number]]  Level of Care: Progressive [102]  Admit to Progressive based on following criteria: MULTISYSTEM THREATS such as stable sepsis, metabolic/electrolyte imbalance with or without encephalopathy that is responding to early treatment.  May admit patient to [facility] or [facility] if equivalent level of care is available:: No  Covid Evaluation: Asymptomatic - no recent exposure (last 10 days) testing not required  Diagnosis: Acute cystitis [595.0.ICD-9-CM]  Admitting Physician: HOWERTER, JUSTIN B [[id number]]  Attending Physician: HOWERTER, JUSTIN B [[id number]]  Certification:: I certify this patient will need inpatient services for at least 2 midnights  Expected Medical Readiness: 06/19/2023          B Medical/Surgery History Past Medical History:  Diagnosis Date   Arthritis    oa   Bipolar disorder (HCC)    Chronic foot pain    Complication of anesthesia    COPD (chronic obstructive pulmonary disease) (HCC)    DVT (deep venous thrombosis) (HCC)    Involving bilateral lower  extremities   GERD (gastroesophageal reflux disease)    Headache    migraines   Hyperlipidemia    Hypertension    Neuropathy    Palpitations last 1 month ago   PONV (postoperative nausea and vomiting)    Sleep apnea    Past Surgical History:  Procedure Laterality Date   ABDOMINAL HYSTERECTOMY  1998   complete   CESAREAN SECTION  1991   CHOLECYSTECTOMY  2005   FOOT SURGERY     left hip muscle tear surgery  2008   shoulder scope Left 2015   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   Procedure: LEFT TOTAL HIP ARTHROPLASTY ANTERIOR APPROACH;  Surgeon: Dempsey Moan, MD;  Location: [facility];  Service: Orthopedics;  Laterality: Left;   TUBAL LIGATION  1992     A IV Location/Drains/Wounds Patient Lines/Drains/Airways Status     Active Line/Drains/Airways     Name Placement date Placement time Site Days   Peripheral IV 06/17/23 20 G Left Antecubital 06/17/23  1925  Antecubital  1   Closed System Drain 1 Left Hip Accordion (Hemovac) 19 Fr. 03/18/15  1158  Hip  3014            Intake/Output Last 24 hours No intake or output data in the 24 hours ending 06/18/23 0207  Labs/Imaging Results for orders placed or performed during the hospital encounter of 06/17/23 (from the past 48 hours)  Basic metabolic panel     Status: Abnormal   Collection Time: 06/17/23  7:02 PM  Result Value Ref Range   Sodium 143 135 - 145 mmol/L   Potassium 4.3 3.5 - 5.1 mmol/L   Chloride 109 98 - 111 mmol/L   CO2 20 (L) 22 - 32 mmol/L   Glucose, Bld 106 (H) 70 - 99 mg/dL    Comment: Glucose reference range applies only to samples taken after fasting for at least 8 hours.   BUN 22 (H) 6 - 20 mg/dL   Creatinine, Ser 8.41 (H) 0.44 - 1.00 mg/dL   Calcium  9.2 8.9 - 10.3 mg/dL   GFR, Estimated 37 (L) >60 mL/min    Comment: (NOTE) Calculated using the CKD-EPI Creatinine Equation (2021)    Anion gap 14 5 - 15    Comment: Performed at [facility], [address], [place], [place]  [address]  CBC     Status: None   Collection Time: 06/17/23  7:02 PM  Result Value Ref Range   WBC 7.5 4.0 - 10.5 K/uL   RBC 4.21 3.87 - 5.11 MIL/uL   Hemoglobin 12.9 12.0 - 15.0 g/dL   HCT 60.9 63.9 - 53.9 %   MCV 92.6 80.0 - 100.0 fL   MCH 30.6 26.0 - 34.0 pg   MCHC 33.1 30.0 - 36.0 g/dL   RDW 86.2 88.4 - 84.4 %   Platelets 176 150 - 400 K/uL   nRBC 0.0 0.0 - 0.2 %    Comment: Performed at [facility], [address], [place], [place] [address]  CBG monitoring, ED     Status: Abnormal   Collection Time: 06/17/23  7:53 PM  Result Value Ref Range   Glucose-Capillary 100 (H) 70 - 99 mg/dL    Comment: Glucose reference range applies only to samples taken after fasting for at least 8 hours.  Urinalysis, Routine w reflex microscopic -Urine, Clean Catch     Status: Abnormal   Collection Time: 06/17/23  9:23 PM  Result Value Ref Range   Color, Urine YELLOW YELLOW   APPearance HAZY (A) CLEAR   Specific Gravity, Urine 1.003 (L) 1.005 - 1.030   pH 7.0 5.0 - 8.0   Glucose, UA NEGATIVE NEGATIVE mg/dL   Hgb urine dipstick SMALL (A) NEGATIVE   Bilirubin Urine NEGATIVE NEGATIVE   Ketones, ur NEGATIVE NEGATIVE mg/dL   Protein, ur NEGATIVE NEGATIVE mg/dL   Nitrite POSITIVE (A) NEGATIVE   Leukocytes,Ua LARGE (A) NEGATIVE   RBC / HPF 0-5 0 - 5 RBC/hpf   WBC, UA 11-20 0 - 5 WBC/hpf   Bacteria, UA RARE (A) NONE SEEN   Squamous Epithelial / HPF 0-5 0 - 5 /HPF    Comment: Performed at [facility], [address], [place], [place] [address]   MR LUMBAR SPINE WO CONTRAST Result Date: 06/17/2023 CLINICAL DATA:  Back trauma. Abnormal neurologic examination. Weakness. Can not walk. EXAM: MRI THORACIC AND LUMBAR SPINE WITHOUT CONTRAST TECHNIQUE: Multiplanar and multiecho pulse sequences of the thoracic and lumbar spine were obtained without intravenous contrast. COMPARISON:  Prior MRI lumbar spine 08/25/2022. FINDINGS: MRI THORACIC SPINE FINDINGS Alignment:  Normal  Vertebrae: Normal marrow signal.  No bone lesions or fractures. Cord:  Normal cord signal intensity.  No cord lesions or syrinx. Paraspinal and other soft tissues: No significant paraspinal findings. No paraspinal hematoma. No posterior mediastinal process or posterior lung abnormality. Disc levels: No thoracic disc protrusions, spinal or foraminal stenosis. Small perineural cysts or dilated nerve root sheaths are noted on the left at T9 and on  the right at T11. MRI LUMBAR SPINE FINDINGS Segmentation: There are five lumbar type vertebral bodies. The last full intervertebral disc space is labeled L5-S1. This correlates with the prior MRI. Alignment:  Normal Vertebrae:  Normal marrow signal.  No bone lesions or fractures. Conus medullaris and cauda equina: Conus extends to the bottom of L1 level. Conus and cauda equina appear normal. Paraspinal and other soft tissues: Numerous bilateral renal cysts are noted. No change since CT scan of 2023. The aorta is normal in caliber. No retroperitoneal mass or adenopathy. Disc levels: T12-L1: Mild annular bulge and slight osteophytic ridging but no spinal or foraminal stenosis. L1-2: Mild facet disease but no disc protrusions, spinal or foraminal stenosis. L2-3: Mild facet disease but no spinal or foraminal stenosis. L3-4: Mild diffuse annular bulge, moderate facet disease and ligamentum flavum thickening contributing to mild bilateral lateral recess stenosis and early spinal stenosis. No foraminal stenosis. L4-5: Bulging annulus, osteophytic ridging and facet disease contributing to moderate right and mild left lateral recess stenosis. No spinal or foraminal stenosis. L5-S1: Bulging annulus, osteophytic ridging and facet disease with mild bilateral lateral recess encroachment. No significant spinal or foraminal stenosis. IMPRESSION: 1. Normal MRI appearance of the thoracic spine and spinal cord. 2. No thoracic disc protrusions, spinal or foraminal stenosis. 3. Small perineural  cysts or dilated nerve root sheaths on the left at T9 and on the right at T11. 4. Mild bilateral lateral recess stenosis and early spinal stenosis at L3-4. 5. Moderate right and mild left lateral recess stenosis at L4-5. 6. Mild bilateral lateral recess encroachment at L5-S1. Electronically Signed   By: MYRTIS Stammer M.D.   On: 06/17/2023 23:13   MR THORACIC SPINE WO CONTRAST Result Date: 06/17/2023 CLINICAL DATA:  Back trauma. Abnormal neurologic examination. Weakness. Can not walk. EXAM: MRI THORACIC AND LUMBAR SPINE WITHOUT CONTRAST TECHNIQUE: Multiplanar and multiecho pulse sequences of the thoracic and lumbar spine were obtained without intravenous contrast. COMPARISON:  Prior MRI lumbar spine 08/25/2022. FINDINGS: MRI THORACIC SPINE FINDINGS Alignment:  Normal Vertebrae: Normal marrow signal.  No bone lesions or fractures. Cord:  Normal cord signal intensity.  No cord lesions or syrinx. Paraspinal and other soft tissues: No significant paraspinal findings. No paraspinal hematoma. No posterior mediastinal process or posterior lung abnormality. Disc levels: No thoracic disc protrusions, spinal or foraminal stenosis. Small perineural cysts or dilated nerve root sheaths are noted on the left at T9 and on the right at T11. MRI LUMBAR SPINE FINDINGS Segmentation: There are five lumbar type vertebral bodies. The last full intervertebral disc space is labeled L5-S1. This correlates with the prior MRI. Alignment:  Normal Vertebrae:  Normal marrow signal.  No bone lesions or fractures. Conus medullaris and cauda equina: Conus extends to the bottom of L1 level. Conus and cauda equina appear normal. Paraspinal and other soft tissues: Numerous bilateral renal cysts are noted. No change since CT scan of 2023. The aorta is normal in caliber. No retroperitoneal mass or adenopathy. Disc levels: T12-L1: Mild annular bulge and slight osteophytic ridging but no spinal or foraminal stenosis. L1-2: Mild facet disease but no disc  protrusions, spinal or foraminal stenosis. L2-3: Mild facet disease but no spinal or foraminal stenosis. L3-4: Mild diffuse annular bulge, moderate facet disease and ligamentum flavum thickening contributing to mild bilateral lateral recess stenosis and early spinal stenosis. No foraminal stenosis. L4-5: Bulging annulus, osteophytic ridging and facet disease contributing to moderate right and mild left lateral recess stenosis. No spinal or foraminal stenosis. L5-S1:  Bulging annulus, osteophytic ridging and facet disease with mild bilateral lateral recess encroachment. No significant spinal or foraminal stenosis. IMPRESSION: 1. Normal MRI appearance of the thoracic spine and spinal cord. 2. No thoracic disc protrusions, spinal or foraminal stenosis. 3. Small perineural cysts or dilated nerve root sheaths on the left at T9 and on the right at T11. 4. Mild bilateral lateral recess stenosis and early spinal stenosis at L3-4. 5. Moderate right and mild left lateral recess stenosis at L4-5. 6. Mild bilateral lateral recess encroachment at L5-S1. Electronically Signed   By: MYRTIS Stammer M.D.   On: 06/17/2023 23:13   CT HEAD WO CONTRAST Result Date: 06/17/2023 CLINICAL DATA:  Blunt facial trauma, fall striking head, anti coagulation. EXAM: CT HEAD WITHOUT CONTRAST TECHNIQUE: Contiguous axial images were obtained from the base of the skull through the vertex without intravenous contrast. RADIATION DOSE REDUCTION: This exam was performed according to the departmental dose-optimization program which includes automated exposure control, adjustment of the mA and/or kV according to patient size and/or use of iterative reconstruction technique. COMPARISON:  None Available. FINDINGS: Brain: The brainstem, cerebellum, cerebral peduncles, thalami, basal ganglia, basilar cisterns, and ventricular system appear within normal limits. No intracranial hemorrhage, mass lesion, or acute CVA. Vascular: Unremarkable Skull: Unremarkable  Sinuses/Orbits: Unremarkable Other: No supplemental non-categorized findings. IMPRESSION: 1. Negative head CT. Electronically Signed   By: Ryan Salvage M.D.   On: 06/17/2023 20:33   DG Knee Complete 4 Views Right Result Date: 06/17/2023 CLINICAL DATA:  Fall, right knee pain EXAM: RIGHT KNEE - COMPLETE 4+ VIEW COMPARISON:  None Available. FINDINGS: No fracture or dislocation is seen. Mild tricompartmental degenerative changes, most prominent in the patellofemoral compartment. Visualized soft tissues are within normal limits. No suprapatellar knee joint effusion. IMPRESSION: Negative. Electronically Signed   By: Pinkie Pebbles M.D.   On: 06/17/2023 19:38    Pending Labs Unresulted Labs (From admission, onward)     Start     Ordered   06/18/23 0500  CBC with Differential/Platelet  Tomorrow morning,   R        06/17/23 2343   06/18/23 0500  Comprehensive metabolic panel  Tomorrow morning,   R        06/17/23 2343   06/18/23 0500  Magnesium  Tomorrow morning,   R        06/17/23 2343   06/18/23 0029  Vitamin B12  Add-on,   AD        06/18/23 0028   06/18/23 0029  CK  Add-on,   AD        06/18/23 0028   06/18/23 0029  TSH  Add-on,   AD        06/18/23 0028   06/18/23 0020  Culture, OB Urine  Add-on,   AD        06/18/23 0019   06/17/23 2344  Magnesium  Add-on,   AD        06/17/23 2343            Vitals/Pain Today's Vitals   06/17/23 2030 06/17/23 2035 06/17/23 2130 06/17/23 2300  BP: (!) 117/93  112/82   Pulse: (!) 125  (!) 122   Resp: (!) 21  (!) 26   Temp:    98.4 F (36.9 C)  TempSrc:    Oral  SpO2: 92%  93%   Weight:      Height:      PainSc:  4       Isolation Precautions  No active isolations  Medications Medications  acetaminophen  (TYLENOL ) tablet 650 mg (has no administration in time range)    Or  acetaminophen  (TYLENOL ) suppository 650 mg (has no administration in time range)  melatonin tablet 3 mg (has no administration in time range)  ondansetron   (ZOFRAN ) injection 4 mg (has no administration in time range)  cefTRIAXone  (ROCEPHIN ) 1 g in sodium chloride  0.9 % 100 mL IVPB (has no administration in time range)  naloxone  (NARCAN ) injection 0.4 mg (has no administration in time range)  oxyCODONE  (Oxy IR/ROXICODONE ) immediate release tablet 5 mg (has no administration in time range)  ALPRAZolam  (XANAX ) tablet 0.5 mg (has no administration in time range)  amitriptyline  (ELAVIL ) tablet 25 mg (25 mg Oral Given 06/18/23 0139)  cyclobenzaprine  (FLEXERIL ) tablet 10 mg (has no administration in time range)  divalproex  (DEPAKOTE  ER) 24 hr tablet 250 mg (has no administration in time range)  divalproex  (DEPAKOTE  ER) 24 hr tablet 500 mg (500 mg Oral Given 06/18/23 0139)  apixaban  (ELIQUIS ) tablet 2.5 mg (2.5 mg Oral Given 06/18/23 0139)  pantoprazole  (PROTONIX ) EC tablet 40 mg (has no administration in time range)  gabapentin  (NEURONTIN ) capsule 300 mg (has no administration in time range)  metoprolol  tartrate (LOPRESSOR ) tablet 25 mg (25 mg Oral Given 06/18/23 0139)  rosuvastatin  (CRESTOR ) tablet 40 mg (has no administration in time range)  albuterol  (PROVENTIL ) (2.5 MG/3ML) 0.083% nebulizer solution 2.5 mg (has no administration in time range)  lidocaine  (LIDODERM ) 5 % 1 patch (1 patch Transdermal Patch Applied 06/18/23 0139)  oxyCODONE  (Oxy IR/ROXICODONE ) immediate release tablet 10 mg (10 mg Oral Given 06/17/23 1945)  sodium chloride  0.9 % bolus 1,000 mL (1,000 mLs Intravenous New Bag/Given 06/17/23 2104)  cefTRIAXone  (ROCEPHIN ) 1 g in sodium chloride  0.9 % 100 mL IVPB (1 g Intravenous New Bag/Given 06/17/23 2334)    Mobility walks with person assist     Focused Assessments    R Recommendations: See Admitting Provider Note  Report given to:   Additional Notes: this pt is getting admitted because of uti but initially she was here far fall. Alert oriented, RA, walks with assist.

## 2023-06-18 NOTE — Progress Notes (Signed)
 Inpatient Rehab Admissions Coordinator:   Per therapy recommendations, patient was screened for CIR candidacy by Megan Salon, MS, CCC-SLP. At this time, Pt. does not appear to demonstrate medical necessity to justify in hospital rehabilitation/CIR. I  will not pursue a rehab consult for this Pt.   Recommend other rehab venues to be pursued.  Please contact me with any questions.  Megan Salon, MS, CCC-SLP Rehab Admissions Coordinator  [phone] (celll) [phone] (office)

## 2023-06-18 NOTE — Hospital Course (Addendum)
 Patient with PMH of chronic back pain, sees neurosurgery and neurology outpatient, neuropathy, COPD, HTN, HLD, CKD 3B sees nephrology outpatient, DVT on Eliquis  presented to the hospital with complaints of recurrent fall. Patient reports she had some fever but no chills or burning urination or any other urinary symptoms. No nausea no vomiting. Admitted for cystitis. Suspect most likely suffering from worsening of her progressive neuropathy.  Assessment and Plan: UTI. Has history of E. coli UTI in the past. Urine has some nitrites. Currently on IV ceftriaxone . Follows up with urology outpatient as well. Monitor.  Recurrent fall. Chronic neuropathy. Chronic back pain. Sees Dr. Rawland outpatient. Recently had injections in her spine on both side. Per PT evaluation patient had some right lean and right-sided preference. On my evaluation bilateral upper extremity strength equal, extraocular muscle movement intact, no facial droop, no pronator drift, normal finger-nose-finger, normal sensation bilaterally to light touch, normal proprioception bilaterally. MRI T-spine and L-spine unremarkable for any significant cord abnormality or any acute finding. Right lean could be coming from her pain. MRI brain negative for any acute stroke.  X-ray hip and pelvis also negative for any acute fractures.  X-ray coccyx negative for any fracture as well.  Chronic pain. Acute coccygeal pain. Recurrent fall. Patient reports severe coccyx pain after her fall last Wednesday on 1/8. Will check x-ray hip and x-ray sacrum and monitor. Patient is on gabapentin .  Tells me that this is not adequate.  Will switch to Lyrica  and monitor.  Will increase the dose on 1/13. Currently on oxycodone .  Patient tells me that she does not have any benefit from this medication.  Tells me that Dilaudid  has worked in the past.  Will switch to oral Dilaudid  1 mg and monitor response.  Mood disorder. On Depakote .  On Xanax .   Continue.  GERD. Continue PPI.  HLD. Continue Crestor  40 mg.  Reported history of COPD. Currently no evidence of exacerbation. Continue as needed inhalers.  HTN. Blood pressure stable. On Lopressor .  Monitor on telemetry.  CKD 3B. Baseline serum creatinine around 1.5-1 2.1. Currently serum creatinine stable. Follows up with nephrology outpatient.  Monitor.  History of DVT. On Eliquis .  Continue.  With her recurrent fall unsure whether the patient is a good candidate for long-term anticoagulation but at least so far her CT of the head is negative for any acute intracranial bleed.  Obesity Class 3 Body mass index is 41.11 kg/m.  Placing the pt at higher risk of poor outcomes.

## 2023-06-18 NOTE — Evaluation (Signed)
 Physical Therapy Evaluation Patient Details Name: Dana [name] MRN: [id number] DOB: Mar 14, 1961 Today's Date: 06/18/2023  History of Present Illness  63 y.o. female admitted to [facility] on 06/17/2023 with acute cystitis in the setting of failure of outpatient oral antibiotics after presenting from home to [facility] ED complaining of generalized weakness.   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  Pt currently with functional limitations due to the deficits listed below (see PT Problem List). Pt will benefit from acute skilled PT to increase their independence and safety with mobility to allow discharge.   Pt reports getting no sleep last night and wanting to rest but was agreeable to ambulate.  Pt present with significant Right lateral lean in standing and ambulation.  Pt reports recurrent falls at home for the past month, along with right LE weakness.  She denies dizziness with falls, reports she just feels off balance and legs give out.  Pt requesting to return to bed to sleep so did not perform further MMT but MD notified of session.  Pt lives alone and reports no assist available upon d/c. She states she is reluctant to d/c to SNF.  Patient will benefit from intensive inpatient follow up therapy, >3 hours/day.  Pt reports she had PT after Covid (in 2020) and resulting left LE weakness.  PT recommended aquatics at that time.  Pt reports this did assist her symptoms and strength however she has not been able to participate since November per her report.            If plan is discharge home, recommend the following: A lot of help with walking and/or transfers;A little help with bathing/dressing/bathroom;Help with stairs or ramp for entrance;Assistance with cooking/housework   Can travel by private  vehicle        Equipment Recommendations Rolling walker (2 wheels)  Recommendations for Other Services       Functional Status Assessment Patient has had a recent decline in their functional status and demonstrates the ability to make significant improvements in function in a reasonable and predictable amount of time.     Precautions / Restrictions Precautions Precautions: Fall Precaution Comments: recurrent falls Restrictions Weight Bearing Restrictions Per Provider Order: No      Mobility  Bed Mobility Overal bed mobility: Needs Assistance Bed Mobility: Supine to Sit, Sit to Supine     Supine to sit: Mod assist, HOB elevated Sit to supine: Mod assist   General bed mobility comments: assist for trunk upright, assist for LEs onto bed    Transfers Overall transfer level: Needs assistance Equipment used: Rolling walker (2 wheels) Transfers: Sit to/from Stand Sit to Stand: Min assist           General transfer comment: assist to rise and steady, right lateral lean upon standing observed    Ambulation/Gait Ambulation/Gait assistance: Min assist Gait Distance (Feet): 60 Feet Assistive device: Rolling walker (2 wheels) Gait Pattern/deviations: Step-to pattern, Decreased weight shift to left, Antalgic, Trunk flexed       General Gait Details: observed right lateral lean, pt reports she is aware but not able to correct, typically falls to right side at home per her reports, denies dizziness with falls  Stairs            Wheelchair Mobility     Tilt Bed  Modified Rankin (Stroke Patients Only)       Balance Overall balance assessment: History of Falls, Needs assistance Sitting-balance support: Feet supported, Single extremity supported Sitting balance-Leahy Scale: Poor Sitting balance - Comments: able to sit upright briefly, lateral lean when donning gown requiring assist Postural control: Right lateral lean Standing balance support: Bilateral  upper extremity supported, During functional activity, Reliant on assistive device for balance Standing balance-Leahy Scale: Poor Standing balance comment: reliant on UE support, right lateral lean in standing and with ambulation                             Pertinent Vitals/Pain Pain Assessment Pain Assessment: 0-10 Pain Score: 7  Pain Location: everything Pain Descriptors / Indicators: Sore Pain Intervention(s): Repositioned, Monitored during session    Home Living Family/patient expects to be discharged to:: Private residence Living Arrangements: Alone   Type of Home: Mobile home Home Access: Stairs to enter Entrance Stairs-Rails: Lawyer of Steps: 2   Home Layout: One level Home Equipment: Rollator (4 wheels)      Prior Function Prior Level of Function : Independent/Modified Independent             Mobility Comments: recurrent falls lately       Extremity/Trunk Assessment        Lower Extremity Assessment Lower Extremity Assessment: RLE deficits/detail;LLE deficits/detail;Generalized weakness RLE Deficits / Details: reports increased weakness however pt wanting to return to bed (no sleep last night) so did not get a chance to MMT LLE Deficits / Details: reports chronic L LE weakness since Covid in 2020       Communication   Communication Communication: No apparent difficulties  Cognition Arousal: Alert Behavior During Therapy: Flat affect Overall Cognitive Status: Within Functional Limits for tasks assessed                                          General Comments      Exercises     Assessment/Plan    PT Assessment Patient needs continued PT services  PT Problem List Decreased strength;Decreased knowledge of use of DME;Decreased activity tolerance;Decreased mobility;Decreased balance;Decreased safety awareness;Obesity       PT Treatment Interventions Gait training;DME instruction;Balance  training;Neuromuscular re-education;Functional mobility training;Therapeutic activities;Therapeutic exercise;Patient/family education    PT Goals (Current goals can be found in the Care Plan section)  Acute Rehab PT Goals PT Goal Formulation: With patient Time For Goal Achievement: 07/02/23 Potential to Achieve Goals: Good    Frequency Min 1X/week     Co-evaluation               AM-PAC PT 6 Clicks Mobility  Outcome Measure Help needed turning from your back to your side while in a flat bed without using bedrails?: A Lot Help needed moving from lying on your back to sitting on the side of a flat bed without using bedrails?: A Lot Help needed moving to and from a bed to a chair (including a wheelchair)?: A Lot Help needed standing up from a chair using your arms (e.g., wheelchair or bedside chair)?: A Lot Help needed to walk in hospital room?: A Lot Help needed climbing 3-5 steps with a railing? : Total 6 Click Score: 11    End of Session Equipment Utilized During Treatment: Gait belt Activity Tolerance: Patient limited by fatigue Patient  left: in bed;with call bell/phone within reach;with SCD's reapplied;with bed alarm set Nurse Communication: Mobility status PT Visit Diagnosis: Other abnormalities of gait and mobility (R26.89);Other symptoms and signs involving the nervous system (R29.898)    Time: 1111-1130 PT Time Calculation (min) (ACUTE ONLY): 19 min   Charges:   PT Evaluation $PT Eval Low Complexity: 1 Low   PT General Charges $$ ACUTE PT VISIT: 1 Visit       Tari PT, DPT Physical Therapist Acute Rehabilitation Services Office: [phone]   Tari CROME Payson 06/18/2023, 12:45 PM

## 2023-06-18 NOTE — Progress Notes (Signed)
   06/18/23 0400  BiPAP/CPAP/SIPAP  Reason BIPAP/CPAP not in use Other(comment) (RT came in to check on pt about cpap. PT states she uses nasal pillow and do not want to use our full face mask. Pt said she would ask someone to bring her mask. RT agreed. Pt refused cpap for tonight)

## 2023-06-18 NOTE — Progress Notes (Signed)
   06/18/23 2200  BiPAP/CPAP/SIPAP  Reason BIPAP/CPAP not in use Non-compliant (Patient does not want to use our mask.  Will try to get one from home.)  BiPAP/CPAP /SiPAP Vitals  Resp 14  MEWS Score/Color  MEWS Score 0  MEWS Score Color Chilton Si

## 2023-06-19 DIAGNOSIS — N3000 Acute cystitis without hematuria: Secondary | ICD-10-CM | POA: Diagnosis not present

## 2023-06-19 LAB — CBC WITH DIFFERENTIAL/PLATELET
Abs Immature Granulocytes: 0.03 10*3/uL (ref 0.00–0.07)
Basophils Absolute: 0 10*3/uL (ref 0.0–0.1)
Basophils Relative: 1 %
Eosinophils Absolute: 0.1 10*3/uL (ref 0.0–0.5)
Eosinophils Relative: 1 %
HCT: 38.3 % (ref 36.0–46.0)
Hemoglobin: 12 g/dL (ref 12.0–15.0)
Immature Granulocytes: 1 %
Lymphocytes Relative: 30 %
Lymphs Abs: 1.7 10*3/uL (ref 0.7–4.0)
MCH: 29.6 pg (ref 26.0–34.0)
MCHC: 31.3 g/dL (ref 30.0–36.0)
MCV: 94.6 fL (ref 80.0–100.0)
Monocytes Absolute: 0.5 10*3/uL (ref 0.1–1.0)
Monocytes Relative: 9 %
Neutro Abs: 3.3 10*3/uL (ref 1.7–7.7)
Neutrophils Relative %: 58 %
Platelets: 200 10*3/uL (ref 150–400)
RBC: 4.05 MIL/uL (ref 3.87–5.11)
RDW: 13.7 % (ref 11.5–15.5)
WBC: 5.6 10*3/uL (ref 4.0–10.5)
nRBC: 0 % (ref 0.0–0.2)

## 2023-06-19 LAB — RENAL FUNCTION PANEL
Albumin: 2.9 g/dL — ABNORMAL LOW (ref 3.5–5.0)
Anion gap: 10 (ref 5–15)
BUN: 26 mg/dL — ABNORMAL HIGH (ref 6–20)
CO2: 20 mmol/L — ABNORMAL LOW (ref 22–32)
Calcium: 9.3 mg/dL (ref 8.9–10.3)
Chloride: 113 mmol/L — ABNORMAL HIGH (ref 98–111)
Creatinine, Ser: 2.04 mg/dL — ABNORMAL HIGH (ref 0.44–1.00)
GFR, Estimated: 27 mL/min — ABNORMAL LOW (ref >60)
Glucose, Bld: 121 mg/dL — ABNORMAL HIGH (ref 70–99)
Phosphorus: 3.6 mg/dL (ref 2.5–4.6)
Potassium: 4.3 mmol/L (ref 3.5–5.1)
Sodium: 143 mmol/L (ref 135–145)

## 2023-06-19 MED ORDER — ALUM & MAG HYDROXIDE-SIMETH 200-200-20 MG/5ML PO SUSP
30.0000 mL | ORAL | Status: DC | PRN
  Administered 2023-06-19: 30 mL via ORAL
  Filled 2023-06-19: qty 30

## 2023-06-19 MED ORDER — HYDROMORPHONE HCL 2 MG PO TABS
1.0000 mg | ORAL_TABLET | ORAL | Status: DC | PRN
  Administered 2023-06-20 – 2023-06-21 (×2): 1 mg via ORAL
  Filled 2023-06-19 (×2): qty 1

## 2023-06-19 MED ORDER — CYCLOBENZAPRINE HCL 5 MG PO TABS
5.0000 mg | ORAL_TABLET | Freq: Three times a day (TID) | ORAL | Status: DC
  Administered 2023-06-19 – 2023-06-21 (×7): 5 mg via ORAL
  Filled 2023-06-19 (×7): qty 1

## 2023-06-19 MED ORDER — PANTOPRAZOLE SODIUM 40 MG PO TBEC
40.0000 mg | DELAYED_RELEASE_TABLET | Freq: Two times a day (BID) | ORAL | Status: DC
  Administered 2023-06-19 – 2023-06-21 (×4): 40 mg via ORAL
  Filled 2023-06-19 (×4): qty 1

## 2023-06-19 MED ORDER — PREGABALIN 75 MG PO CAPS
75.0000 mg | ORAL_CAPSULE | Freq: Three times a day (TID) | ORAL | Status: DC
  Administered 2023-06-19 – 2023-06-21 (×6): 75 mg via ORAL
  Filled 2023-06-19 (×6): qty 1

## 2023-06-19 NOTE — TOC Initial Note (Signed)
 Transition of Care [facility]) - Initial/Assessment Note    Patient Details  Name: Dana [name] MRN: [id number] Date of Birth: Jan 06, 1961  Transition of Care [facility]) CM/SW Contact:    Tawni CHRISTELLA Eva, LCSW Phone Number: 06/19/2023, 11:53 AM  Clinical Narrative:                 CSW met with the pt to discuss discharge planning. CSW informed the pt that CIR has decline her for Acute inpatient rehab. CSW then discussed the option of a short-term rehabilitation placement.  Pt expressed a preference for [facility] due to having family members in that area. CSW explained the process, noting that insurance authorization would be required. Pt agreed to have her information sent to the [facility] area as well. TOC to follow.   Expected Discharge Plan: Skilled Nursing Facility Barriers to Discharge: Continued Medical Work up   Patient Goals and CMS Choice Patient states their goals for this hospitalization and ongoing recovery are:: SNf to get stonger          Expected Discharge Plan and Services       Living arrangements for the past 2 months: Single Family Home                                      Prior Living Arrangements/Services Living arrangements for the past 2 months: Single Family Home Lives with:: Self   Do you feel safe going back to the place where you live?: Yes      Need for Family Participation in Patient Care: No (Comment) Care giver support system in place?: No (comment)      Activities of Daily Living   ADL Screening (condition at time of admission) Independently performs ADLs?: No Does the patient have a NEW difficulty with bathing/dressing/toileting/self-feeding that is expected to last >3 days?: Yes (Initiates electronic notice to provider for possible OT consult) Does the patient have a NEW difficulty with getting in/out of bed, walking, or climbing stairs that is expected to last >3 days?: No Does the patient have a NEW difficulty  with communication that is expected to last >3 days?: No Is the patient deaf or have difficulty hearing?: No Does the patient have difficulty seeing, even when wearing glasses/contacts?: No Does the patient have difficulty concentrating, remembering, or making decisions?: No  Permission Sought/Granted                  Emotional Assessment Appearance:: Appears stated age Attitude/Demeanor/Rapport: Gracious, Charismatic, Engaged Affect (typically observed): Accepting Orientation: : Oriented to Self, Oriented to Place, Oriented to  Time, Oriented to Situation   Psych Involvement: No (comment)  Admission diagnosis:  Acute cystitis [N30.00] Lower urinary tract infectious disease [N39.0] Tachycardia [R00.0] Recurrent falls [R29.6] Patient Active Problem List   Diagnosis Date Noted   Generalized weakness 06/18/2023   Recurrent falls 06/18/2023   Chronic low back pain 06/18/2023   History of COPD 06/18/2023   DVT (deep venous thrombosis) (HCC) 06/18/2023   Acute cystitis 06/17/2023   Precordial chest pain 09/19/2022   Palpitations 09/19/2022   Constipation by outlet obstruction 11/27/2019   Esophageal reflux 11/27/2019   History of colon polyps 11/27/2019   Peripheral polyneuropathy 11/27/2019   Vitamin D deficiency 11/27/2019   Heartburn 09/07/2019   Obstructive sleep apnea on CPAP 09/07/2019   Renal cyst 08/20/2019   Urge incontinence 08/20/2019   Bipolar disorder, current  episode depressed, moderate (HCC) 04/28/2019   Psychomotor agitation 04/28/2019   COVID-19 04/19/2019   Hypernatremia 04/19/2019   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   Neck pain 10/07/2018   Severe obesity (BMI 35.0-39.9) with comorbidity (HCC) 07/05/2018   CKD stage 3b, GFR 30-44 ml/min (HCC) 07/05/2018   Plantar fasciitis 10/22/2015   Torn tendon 10/22/2015   OA (osteoarthritis) of hip 03/18/2015   HLD (hyperlipidemia) 08/06/2014   Migraine with aura 02/28/2014   Essential hypertension 09/27/2013    Follow-up exam after treatment 09/27/2013   Eye problem 09/13/2013   Bipolar disorder (HCC) 04/05/2012   PCP:  Rolinda Millman, MD Pharmacy:   [facility] [phone] - [place], [place] - [address] [place] [address] Phone: [phone] Fax: [phone]  [facility] [address] [place] [address] Phone: [phone] Fax: [phone]     Social Drivers of Health (SDOH) Social History: SDOH Screenings   Food Insecurity: No Food Insecurity (06/18/2023)  Housing: Low Risk  (06/18/2023)  Transportation Needs: No Transportation Needs (06/18/2023)  Utilities: Not At Risk (06/18/2023)  Depression (PHQ2-9): Low Risk  (09/27/2022)  Financial Resource Strain: Low Risk  (09/29/2021)   Received from [facility]  Physical Activity: Inactive (09/29/2021)   Received from [facility]  Social Connections: Unknown (10/05/2022)   [facility] from [facility]  Stress: No Stress Concern Present (09/29/2021)   Received from [facility]  Tobacco Use: Medium Risk (06/18/2023)   SDOH Interventions:     Readmission Risk Interventions     No data to display

## 2023-06-19 NOTE — Evaluation (Signed)
 Occupational Therapy Evaluation Patient Details Name: Dana [name] MRN: [id number] DOB: 09/09/1960 Today's Date: 06/19/2023   History of Present Illness 63 y.o. female admitted to [facility] on 06/17/2023 with acute cystitis in the setting of failure of outpatient oral antibiotics after presenting from home to [facility] ED complaining of generalized weakness.   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 Patient was living at home alone prior level with independence in ADLs. Patient did report having various falls in last few months prior to admission. Currently, patient is min A for transfers with RW with cues for proper positioning, max A for dressing/bating LB tasks. Patient was noted to have decreased functional activity tolerance, decreased endurance, decreased standing balance, decreased safety awareness, and decreased knowledge of AD/AE impacting participation in ADLs. Patient will benefit from continued inpatient follow up therapy, <3 hours/day. Patient would continue to benefit from skilled OT services at this time while admitted and after d/c to address noted deficits in order to improve overall safety and independence in ADLs.        If plan is discharge home, recommend the following: A lot of help with bathing/dressing/bathroom;Assistance with cooking/housework;Direct supervision/assist for medications management;Assist for transportation;Direct supervision/assist for financial management;Help with stairs or ramp for entrance;A little help with bathing/dressing/bathroom    Functional Status Assessment  Patient has had a recent decline in their functional status and demonstrates the ability to make significant improvements in function in a reasonable and predictable  amount of time.  Equipment Recommendations  None recommended by OT       Precautions / Restrictions Precautions Precautions: Fall Restrictions Weight Bearing Restrictions Per Provider Order: No      Mobility Bed Mobility Overal bed mobility: Needs Assistance Bed Mobility: Supine to Sit     Supine to sit: Min assist                   Balance Overall balance assessment: History of Falls, Needs assistance Sitting-balance support: Feet supported, Single extremity supported Sitting balance-Leahy Scale: Fair     Standing balance support: Bilateral upper extremity supported, During functional activity, Reliant on assistive device for balance Standing balance-Leahy Scale: Poor             ADL either performed or assessed with clinical judgement   ADL Overall ADL's : Needs assistance/impaired Eating/Feeding: Set up;Sitting   Grooming: Sitting;Brushing hair Grooming Details (indicate cue type and reason): provided with bag from closet to get brush out. Upper Body Bathing: Sitting;Minimal assistance   Lower Body Bathing: Sitting/lateral leans;Maximal assistance Lower Body Bathing Details (indicate cue type and reason): unable to figure four legs. Upper Body Dressing : Sitting;Set up   Lower Body Dressing: Maximal assistance;Sitting/lateral leans   Toilet Transfer: Minimal assistance;Ambulation;Rolling walker (2 wheels) Toilet Transfer Details (indicate cue type and reason): with increased time to transfer from one side of the bed to the other side where recliner is. cues to keep walker close. Toileting- Clothing Manipulation and Hygiene: Maximal assistance;Sit to/from stand               Vision   Vision Assessment?: No apparent visual deficits            Pertinent Vitals/Pain Pain Assessment Pain Assessment: 0-10 Pain Score: 7  Pain Location: back Pain Descriptors /  Indicators: Constant Pain Intervention(s): Limited activity within patient's  tolerance, Monitored during session     Extremity/Trunk Assessment Upper Extremity Assessment Upper Extremity Assessment: Overall WFL for tasks assessed   Lower Extremity Assessment Lower Extremity Assessment: Defer to PT evaluation   Cervical / Trunk Assessment Cervical / Trunk Assessment: Normal   Communication Communication Communication: No apparent difficulties   Cognition Arousal: Alert Behavior During Therapy: Flat affect Overall Cognitive Status: Within Functional Limits for tasks assessed                          Home Living Family/patient expects to be discharged to:: Private residence Living Arrangements: Alone Available Help at Discharge: Family;Available PRN/intermittently Type of Home: Mobile home Home Access: Stairs to enter Entrance Stairs-Number of Steps: 2 Entrance Stairs-Rails: Left;Right Home Layout: One level               Home Equipment: Rollator (4 wheels)          Prior Functioning/Environment Prior Level of Function : Independent/Modified Independent                        OT Problem List: Decreased strength;Decreased activity tolerance;Decreased safety awareness;Decreased knowledge of use of DME or AE      OT Treatment/Interventions: Self-care/ADL training;DME and/or AE instruction;Therapeutic activities;Balance training;Neuromuscular education;Patient/family education    OT Goals(Current goals can be found in the care plan section) Acute Rehab OT Goals Patient Stated Goal: to get stronger. OT Goal Formulation: With patient Time For Goal Achievement: 07/03/23 Potential to Achieve Goals: Fair  OT Frequency: Min 1X/week       AM-PAC OT 6 Clicks Daily Activity     Outcome Measure Help from another person eating meals?: A Little Help from another person taking care of personal grooming?: A Little Help from another person toileting, which includes using toliet, bedpan, or urinal?: A Lot Help from another person  bathing (including washing, rinsing, drying)?: A Lot Help from another person to put on and taking off regular upper body clothing?: A Little Help from another person to put on and taking off regular lower body clothing?: A Lot 6 Click Score: 15   End of Session Equipment Utilized During Treatment: Gait belt;Rolling walker (2 wheels) Nurse Communication: Mobility status  Activity Tolerance: Patient tolerated treatment well Patient left: in chair;with call bell/phone within reach  OT Visit Diagnosis: Unsteadiness on feet (R26.81)                Time: 8846-8782 OT Time Calculation (min): 24 min Charges:  OT General Charges $OT Visit: 1 Visit OT Evaluation $OT Eval Low Complexity: 1 Low OT Treatments $Self Care/Home Management : 8-22 mins  Geofm LEYLAND, MS Acute Rehabilitation Department Office# [phone]   Geofm CHRISTELLA Dance 06/19/2023, 1:48 PM

## 2023-06-19 NOTE — Progress Notes (Signed)
   06/19/23 2032  BiPAP/CPAP/SIPAP  Reason BIPAP/CPAP not in use Non-compliant   Pt refused cpap again tonight.  Pt does not like / want to wear the hospital-provided mask.  Pt stated she may ask family to bring in her nasal pillows from home.

## 2023-06-19 NOTE — Plan of Care (Signed)

## 2023-06-19 NOTE — Progress Notes (Signed)
 [facility] Progress Note Patient: Dana [name] FMW:[id number] DOB: [birth year] DOA: 06/17/2023  DOS: the patient was seen and examined on 06/19/2023  Brief Hospital Course: Patient with PMH of chronic back pain, sees neurosurgery and neurology outpatient, neuropathy, COPD, HTN, HLD, CKD 3B sees nephrology outpatient, DVT on Eliquis  presented to the hospital with complaints of recurrent fall. Patient reports she had some fever but no chills or burning urination or any other urinary symptoms. No nausea no vomiting. Admitted for cystitis. Suspect most likely suffering from worsening of her progressive neuropathy.  Assessment and Plan: UTI. Has history of E. coli UTI in the past. Urine has some nitrites. Currently on IV ceftriaxone . Follows up with urology outpatient as well. Monitor.  Recurrent fall. Chronic neuropathy. Chronic back pain. Sees Dr. Rawland outpatient. Recently had injections in her spine on both side. Per PT evaluation patient had some right lean and right-sided preference. On my evaluation bilateral upper extremity strength equal, extraocular muscle movement intact, no facial droop, no pronator drift, normal finger-nose-finger, normal sensation bilaterally to light touch, normal proprioception bilaterally. MRI T-spine and L-spine unremarkable for any significant cord abnormality or any acute finding. Right lean could be coming from her pain. MRI brain negative for any acute stroke.  X-ray hip and pelvis also negative for any acute fractures.  X-ray coccyx negative for any fracture as well.  Chronic pain. Acute coccygeal pain. Recurrent fall. Patient reports severe coccyx pain after her fall last Wednesday on 1/8. Will check x-ray hip and x-ray sacrum and monitor. Patient is on gabapentin .  Tells me that this is not adequate.  Will switch to Lyrica  and monitor.  Will increase the dose on 1/13. Currently on oxycodone .  Patient tells me that she does not have any  benefit from this medication.  Tells me that Dilaudid  has worked in the past.  Will switch to oral Dilaudid  1 mg and monitor response.  Mood disorder. On Depakote .  On Xanax .  Continue.  GERD. Continue PPI.  HLD. Continue Crestor  40 mg.  Reported history of COPD. Currently no evidence of exacerbation. Continue as needed inhalers.  HTN. Blood pressure stable. On Lopressor .  Monitor on telemetry.  CKD 3B. Baseline serum creatinine around 1.5-1 2.1. Currently serum creatinine stable. Follows up with nephrology outpatient.  Monitor.  History of DVT. On Eliquis .  Continue.  With her recurrent fall unsure whether the patient is a good candidate for long-term anticoagulation but at least so far her CT of the head is negative for any acute intracranial bleed.  Obesity Class 3 Body mass index is 41.11 kg/m.  Placing the pt at higher risk of poor outcomes.    Subjective: Reports pain and has a mild tissue.  No nausea no vomiting no fever no chills.  Had a BM.  Also reports burning urination.  Informed that we do not have any medication that would help with those symptoms given her renal function  Physical Exam: General: in Mild distress, No Rash Cardiovascular: S1 and S2 Present, No Murmur Respiratory: Good respiratory effort, Bilateral Air entry present. No Crackles, No wheezes Abdomen: Bowel Sound present, No tenderness Extremities: No edema Neuro: Alert and oriented x3, no new focal deficit  Data Reviewed: I have Reviewed nursing notes, Vitals, and Lab results. Since last encounter, pertinent lab results CBC and BMP   . I have ordered test including CBC and BMP  .   Disposition: Status is: Inpatient Remains inpatient appropriate because: Needing IV antibiotic.  apixaban  (ELIQUIS ) tablet 2.5  mg Start: 06/18/23 0045 SCDs Start: 06/17/23 2343 apixaban  (ELIQUIS ) tablet 2.5 mg   Family Communication: No one at bedside Level of care: Progressive  Vitals:   06/19/23 0300  06/19/23 0307 06/19/23 0904 06/19/23 1255  BP:  118/82 124/88 103/68  Pulse:  73 86 84  Resp:  17  20  Temp:  98.6 F (37 C)  97.7 F (36.5 C)  TempSrc:  Oral  Oral  SpO2:  96%  96%  Weight: 117.8 kg     Height:         Author: Yetta Blanch, MD 06/19/2023 7:22 PM  Please look on [email or web address] to find out who is on call.

## 2023-06-19 NOTE — NC FL2 (Signed)
 [place]  MEDICAID FL2 LEVEL OF CARE FORM     IDENTIFICATION  Patient Name: Dana [name] Birthdate: Aug 30, 1960 Sex: female Admission Date (Current Location): 06/17/2023  [facility] and [place] Number:  Producer, Television/film/video and Address:  [facility],  [address] [place], [place] [address]      Provider Number: [id number]  Attending Physician Name and Address:  Tobie Yetta HERO, MD  Relative Name and Phone Number:  Cox,Seth Vidal)  [phone] [facility])    Current Level of Care: Hospital Recommended Level of Care: Skilled Nursing Facility Prior Approval Number:    Date Approved/Denied:   PASRR Number: [id number]  Discharge Plan: SNF    Current Diagnoses: Patient Active Problem List   Diagnosis Date Noted   Generalized weakness 06/18/2023   Recurrent falls 06/18/2023   Chronic low back pain 06/18/2023   History of COPD 06/18/2023   DVT (deep venous thrombosis) (HCC) 06/18/2023   Acute cystitis 06/17/2023   Precordial chest pain 09/19/2022   Palpitations 09/19/2022   Constipation by outlet obstruction 11/27/2019   Esophageal reflux 11/27/2019   History of colon polyps 11/27/2019   Peripheral polyneuropathy 11/27/2019   Vitamin D deficiency 11/27/2019   Heartburn 09/07/2019   Obstructive sleep apnea on CPAP 09/07/2019   Renal cyst 08/20/2019   Urge incontinence 08/20/2019   Bipolar disorder, current episode depressed, moderate (HCC) 04/28/2019   Psychomotor agitation 04/28/2019   COVID-19 04/19/2019   Hypernatremia 04/19/2019   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   Neck pain 10/07/2018   Severe obesity (BMI 35.0-39.9) with comorbidity (HCC) 07/05/2018   CKD stage 3b, GFR 30-44 ml/min (HCC) 07/05/2018   Plantar fasciitis 10/22/2015   Torn tendon 10/22/2015   OA (osteoarthritis) of hip 03/18/2015   HLD (hyperlipidemia) 08/06/2014   Migraine with aura 02/28/2014   Essential hypertension 09/27/2013   Follow-up exam after treatment 09/27/2013   Eye problem  09/13/2013   Bipolar disorder (HCC) 04/05/2012    Orientation RESPIRATION BLADDER Height & Weight     Self, Time, Situation  Normal Incontinent Weight: 259 lb 11.2 oz (117.8 kg) Height:  5' 6 (167.6 cm)  BEHAVIORAL SYMPTOMS/MOOD NEUROLOGICAL BOWEL NUTRITION STATUS      Continent Diet (regular)  AMBULATORY STATUS COMMUNICATION OF NEEDS Skin   Limited Assist Verbally Normal                       Personal Care Assistance Level of Assistance  Bathing, Dressing, Feeding Bathing Assistance: Limited assistance Feeding assistance: Independent Dressing Assistance: Limited assistance     Functional Limitations Info  Sight, Hearing, Speech Sight Info: Adequate Hearing Info: Adequate Speech Info: Adequate    SPECIAL CARE FACTORS FREQUENCY  PT (By licensed PT), OT (By licensed OT)     PT Frequency: 5 x a week OT Frequency: 5 x a week            Contractures Contractures Info: Not present    Additional Factors Info  Code Status, Allergies Code Status Info: full Allergies Info: Anesthetics, Amide  Other  Oxybutynin  Hyoscyamine  Codeine           Current Medications (06/19/2023):  This is the current hospital active medication list Current Facility-Administered Medications  Medication Dose Route Frequency Provider Last Rate Last Admin   acetaminophen  (TYLENOL ) tablet 650 mg  650 mg Oral Q6H PRN Howerter, Justin B, DO       Or   acetaminophen  (TYLENOL ) suppository 650 mg  650 mg Rectal Q6H PRN  Howerter, Justin B, DO       albuterol  (PROVENTIL ) (2.5 MG/3ML) 0.083% nebulizer solution 2.5 mg  2.5 mg Nebulization Q4H PRN Howerter, Justin B, DO       ALPRAZolam  (XANAX ) tablet 0.5 mg  0.5 mg Oral BID PRN Howerter, Justin B, DO       amitriptyline  (ELAVIL ) tablet 25 mg  25 mg Oral QHS Howerter, Justin B, DO   25 mg at 06/18/23 2108   apixaban  (ELIQUIS ) tablet 2.5 mg  2.5 mg Oral BID Howerter, Justin B, DO   2.5 mg at 06/19/23 9096   cefTRIAXone  (ROCEPHIN ) 1 g in sodium  chloride 0.9 % 100 mL IVPB  1 g Intravenous Q24H Howerter, Justin B, DO   Stopped at 06/18/23 1806   cyanocobalamin  (VITAMIN B12) tablet 1,000 mcg  1,000 mcg Oral Daily Patel, Pranav M, MD   1,000 mcg at 06/19/23 9096   cyclobenzaprine  (FLEXERIL ) tablet 5 mg  5 mg Oral TID Patel, Pranav M, MD   5 mg at 06/19/23 1114   divalproex  (DEPAKOTE  ER) 24 hr tablet 250 mg  250 mg Oral Daily Howerter, Justin B, DO   250 mg at 06/19/23 9095   divalproex  (DEPAKOTE  ER) 24 hr tablet 500 mg  500 mg Oral QHS Howerter, Justin B, DO   500 mg at 06/18/23 2108   folic acid  (FOLVITE ) tablet 1 mg  1 mg Oral Daily Patel, Pranav M, MD   1 mg at 06/19/23 9096   HYDROmorphone  (DILAUDID ) tablet 1 mg  1 mg Oral Q4H PRN Patel, Pranav M, MD       lidocaine  (LIDODERM ) 5 % 1 patch  1 patch Transdermal Q24H Howerter, Justin B, DO   1 patch at 06/18/23 2215   melatonin tablet 3 mg  3 mg Oral QHS PRN Howerter, Justin B, DO   3 mg at 06/18/23 2107   metoprolol  tartrate (LOPRESSOR ) tablet 25 mg  25 mg Oral BID Howerter, Justin B, DO   25 mg at 06/19/23 9095   naloxone  (NARCAN ) injection 0.4 mg  0.4 mg Intravenous PRN Howerter, Justin B, DO       ondansetron  (ZOFRAN ) injection 4 mg  4 mg Intravenous Q6H PRN Howerter, Justin B, DO       pantoprazole  (PROTONIX ) EC tablet 40 mg  40 mg Oral Daily Howerter, Justin B, DO   40 mg at 06/19/23 9096   pregabalin  (LYRICA ) capsule 75 mg  75 mg Oral TID Patel, Pranav M, MD       rosuvastatin  (CRESTOR ) tablet 40 mg  40 mg Oral Daily Howerter, Justin B, DO   40 mg at 06/19/23 9096     Discharge Medications: Please see discharge summary for a list of discharge medications.  Relevant Imaging Results:  Relevant Lab Results:   Additional Information SSN:[id number]  Tawni HERO Stanislawa Gaffin, LCSW

## 2023-06-20 DIAGNOSIS — N3000 Acute cystitis without hematuria: Secondary | ICD-10-CM | POA: Diagnosis not present

## 2023-06-20 MED ORDER — CLOTRIMAZOLE 1 % VA CREA
1.0000 | TOPICAL_CREAM | Freq: Every day | VAGINAL | Status: DC
  Administered 2023-06-20: 1 via VAGINAL
  Filled 2023-06-20: qty 45

## 2023-06-20 MED ORDER — ESTROGENS CONJUGATED 0.625 MG/GM VA CREA
0.2500 | TOPICAL_CREAM | Freq: Every day | VAGINAL | Status: DC
  Administered 2023-06-20 – 2023-06-21 (×2): 0.25 via VAGINAL
  Filled 2023-06-20: qty 30

## 2023-06-20 MED ORDER — AMITRIPTYLINE HCL 25 MG PO TABS
50.0000 mg | ORAL_TABLET | Freq: Every day | ORAL | Status: DC
  Administered 2023-06-20: 50 mg via ORAL
  Filled 2023-06-20: qty 2

## 2023-06-20 NOTE — Progress Notes (Signed)
 [facility] Progress Note Patient: Dana [name] FMW:[id number] DOB: 02/15/1961 DOA: 06/17/2023  DOS: the patient was seen and examined on 06/20/2023  Brief Hospital Course: Patient with PMH of chronic back pain, sees neurosurgery and neurology outpatient, neuropathy, COPD, HTN, HLD, CKD 3B sees nephrology outpatient, DVT on Eliquis  presented to the hospital with complaints of recurrent fall. Patient reports she had some fever but no chills or burning urination or any other urinary symptoms. No nausea no vomiting. Admitted for cystitis. Suspect most likely suffering from worsening of her progressive neuropathy.  Assessment and Plan: UTI. Has history of E. coli UTI in the past. Urine has some nitrites. Currently on IV ceftriaxone . Follows up with urology outpatient as well. Urine culture growing E. coli.  Sensitivity pending.  Monitor.  Recurrent fall. Chronic neuropathy. Chronic back pain. Sees Dr. Rawland outpatient. Recently had injections in her spine on both side. Per PT evaluation patient had some right lean and right-sided preference. On my evaluation bilateral upper extremity strength equal, extraocular muscle movement intact, no facial droop, no pronator drift, normal finger-nose-finger, normal sensation bilaterally to light touch, normal proprioception bilaterally. MRI T-spine and L-spine unremarkable for any significant cord abnormality or any acute finding. Right lean could be coming from her pain. MRI brain negative for any acute stroke.  X-ray hip and pelvis also negative for any acute fractures.  X-ray coccyx negative for any fracture as well.  Chronic pain. Acute coccygeal pain. Recurrent fall. Patient reports severe coccyx pain after her fall last Wednesday on 1/8. X-ray hip and x-ray sacrum negative for any acute abnormality. Patient is on gabapentin .  Tells me that this is not adequate. Currently switched to Lyrica . On oxycodone  at home. Currently on  equivalent dose of oral Dilaudid .  Mood disorder. On Depakote .  On Xanax .  Continue.  GERD. Continue PPI.  HLD. Continue Crestor  40 mg.  Reported history of COPD. Currently no evidence of exacerbation. Continue as needed inhalers.  HTN. Blood pressure stable. On Lopressor .  Monitor on telemetry.  CKD 3B. Baseline serum creatinine around 1.5-1 2.1. Currently serum creatinine stable. Follows up with nephrology outpatient.  Monitor.  History of DVT. On Eliquis .  Continue.  With her recurrent fall unsure whether the patient is a good candidate for long-term anticoagulation but at least so far her CT of the head is negative for any acute intracranial bleed.  Obesity Class 3 Body mass index is 41.11 kg/m.  Placing the pt at higher risk of poor outcomes.    Subjective: Reports burning when urination.  No nausea no vomiting.  Wants to go home rather than to SNF.  Planning to see her neurosurgery doctor on Thursday.  Physical Exam: General: in Mild distress, No Rash Cardiovascular: S1 and S2 Present, No Murmur Respiratory: Good respiratory effort, Bilateral Air entry present. No Crackles, No wheezes Abdomen: Bowel Sound present, No tenderness Extremities: No edema Neuro: Alert and oriented x3, no new focal deficit  Data Reviewed: I have Reviewed nursing notes, Vitals, and Lab results. I have ordered test including CBC and BMP  .  Disposition: Status is: Inpatient Remains inpatient appropriate because: Awaiting sensitivity on urine culture  apixaban  (ELIQUIS ) tablet 2.5 mg Start: 06/18/23 0045 SCDs Start: 06/17/23 2343 apixaban  (ELIQUIS ) tablet 2.5 mg   Family Communication: No one at bedside Level of care: Progressive switch to MedSurg Vitals:   06/20/23 0500 06/20/23 0510 06/20/23 0516 06/20/23 1158  BP:  (!) 135/94 (!) 135/94 120/86  Pulse:  78 78 80  Resp:  16  17  Temp:  97.8 F (36.6 C) 97.8 F (36.6 C) 97.8 F (36.6 C)  TempSrc:  Oral Oral Oral  SpO2:  96%  96% 94%  Weight: 117 kg     Height:         Author: Yetta Blanch, MD 06/20/2023 7:29 PM  Please look on [email or web address] to find out who is on call.

## 2023-06-20 NOTE — Progress Notes (Addendum)
 Physical Therapy Treatment Patient Details Name: Dana [name] MRN: [id number] DOB: 10/28/1960 Today's Date: 06/20/2023   History of Present Illness 63 y.o. female admitted to [facility] on 06/17/2023 with acute cystitis in the setting of failure of outpatient oral antibiotics after presenting from home to [facility] ED complaining of generalized weakness.   Past medical history significant for chronic low back pain, COPD, essential pretension, hyperlipidemia, CKD 3B, DVTs in the bilateral lower extremities on Eliquis , neuropathy    PT Comments  Pt agreeable to working with therapy. She reports she is aware CIR is not an option. She states she prefers to d/c home instead of to a SNF. Encouraged pt to try to get up more throughout the day-walk to bathroom instead of use purewick. Will continue to follow and progress activity as tolerated. If pt chooses to return home, recommend HHPT f/u.     If plan is discharge home, recommend the following: A little help with walking and/or transfers;A little help with bathing/dressing/bathroom;Assistance with cooking/housework;Assist for transportation;Help with stairs or ramp for entrance   Can travel by private vehicle        Equipment Recommendations  Rolling walker (2 wheels)    Recommendations for Other Services       Precautions / Restrictions Precautions Precautions: Fall Precaution Comments: recurrent falls Restrictions Weight Bearing Restrictions Per Provider Order: No     Mobility  Bed Mobility Overal bed mobility: Needs Assistance Bed Mobility: Sidelying to Sit, Sit to Supine   Sidelying to sit: Contact guard assist, HOB elevated, Used rails   Sit to supine: Contact guard assist, HOB elevated, Used rails   General bed mobility comments: Increased time.    Transfers Overall transfer level: Needs assistance Equipment used: Rolling walker (2 wheels) Transfers: Sit to/from Stand Sit to Stand: Min assist           General  transfer comment: Assist to steady. Cues for safety, hand placement.    Ambulation/Gait Ambulation/Gait assistance: Min assist Gait Distance (Feet): 75 Feet Assistive device: Rolling walker (2 wheels) Gait Pattern/deviations: Decreased step length - left, Decreased step length - right, Decreased stride length       General Gait Details: R side trunk listing on today. Cues for safety, RW proximity. Pt tends to take quick short step lengths bilaterally. Unsteady but no overt LOB on today. PT denied dizziness.   Stairs             Wheelchair Mobility     Tilt Bed    Modified Rankin (Stroke Patients Only)       Balance Overall balance assessment: History of Falls         Standing balance support: Bilateral upper extremity supported, During functional activity, Reliant on assistive device for balance Standing balance-Leahy Scale: Poor                              Cognition Arousal: Alert Behavior During Therapy: Flat affect Overall Cognitive Status: Within Functional Limits for tasks assessed                                          Exercises      General Comments        Pertinent Vitals/Pain Pain Assessment Pain Assessment: Faces Faces Pain Scale: Hurts even more Pain Location: back Pain Descriptors /  Indicators: Discomfort, Aching Pain Intervention(s): Monitored during session, Repositioned    Home Living                          Prior Function            PT Goals (current goals can now be found in the care plan section) Progress towards PT goals: Progressing toward goals    Frequency    Min 1X/week      PT Plan      Co-evaluation              AM-PAC PT 6 Clicks Mobility   Outcome Measure  Help needed turning from your back to your side while in a flat bed without using bedrails?: A Little Help needed moving from lying on your back to sitting on the side of a flat bed without using  bedrails?: A Little Help needed moving to and from a bed to a chair (including a wheelchair)?: A Little Help needed standing up from a chair using your arms (e.g., wheelchair or bedside chair)?: A Little Help needed to walk in hospital room?: A Little Help needed climbing 3-5 steps with a railing? : Total 6 Click Score: 16    End of Session Equipment Utilized During Treatment: Gait belt Activity Tolerance: Patient limited by fatigue Patient left: in bed;with call bell/phone within reach   PT Visit Diagnosis: Other abnormalities of gait and mobility (R26.89);Other symptoms and signs involving the nervous system (R29.898)     Time: 8993-8978 PT Time Calculation (min) (ACUTE ONLY): 15 min  Charges:    $Gait Training: 8-22 mins PT General Charges $$ ACUTE PT VISIT: 1 Visit                        Dannial SQUIBB, PT Acute Rehabilitation  Office: [phone]

## 2023-06-20 NOTE — Plan of Care (Signed)

## 2023-06-20 NOTE — TOC Progression Note (Addendum)
 Transition of Care [facility]) - Progression Note    Patient Details  Name: Dana [name] MRN: [id number] Date of Birth: 03-11-1961  [facility]) CM/SW Contact  Tawni CHRISTELLA Eva, LCSW Phone Number: 06/20/2023, 11:41 AM  Clinical Narrative:     CSW spoke with the pt to discuss bed offers. The pt has declined SNF placement. She reports having spoken to her son, who believes she would be better off at home. The pt has a niece who lives 10 minutes away and a son who lives 35 minutes away. She also stated that her neighbors are willing to help, and they have a key to her home in case she falls and needs immediate assistance. The pt would like home health services and has no preference for the agency.  CSW to arrange [facility] services. TOC to follow.  Adden 3:00pm [facility] accepted pt for HHPT/OT/SW and aide. Pt will need HH orders , MD made aware. Pt reports no DME needs.     Barriers to Discharge: Continued Medical Work up  Expected Discharge Plan and Services       Living arrangements for the past 2 months: Single Family Home                                       Social Determinants of Health (SDOH) Interventions SDOH Screenings   Food Insecurity: No Food Insecurity (06/18/2023)  Housing: Low Risk  (06/18/2023)  Transportation Needs: No Transportation Needs (06/18/2023)  Utilities: Not At Risk (06/18/2023)  Depression (PHQ2-9): Low Risk  (09/27/2022)  Financial Resource Strain: Low Risk  (09/29/2021)   Received from [facility], [facility]  Physical Activity: Inactive (09/29/2021)   Received from [facility], [facility]  Social Connections: Unknown (10/05/2022)   Received from [facility], [facility]  Stress: No Stress Concern Present (09/29/2021)   Received from [facility], [facility]  Tobacco Use: Medium Risk (06/18/2023)    Readmission Risk Interventions     No data to display

## 2023-06-20 NOTE — Progress Notes (Signed)
   06/20/23 2050  BiPAP/CPAP/SIPAP  Reason BIPAP/CPAP not in use Non-compliant

## 2023-06-21 DIAGNOSIS — N3000 Acute cystitis without hematuria: Secondary | ICD-10-CM | POA: Diagnosis not present

## 2023-06-21 LAB — BASIC METABOLIC PANEL
Anion gap: 9 (ref 5–15)
BUN: 31 mg/dL — ABNORMAL HIGH (ref 6–20)
CO2: 22 mmol/L (ref 22–32)
Calcium: 9.3 mg/dL (ref 8.9–10.3)
Chloride: 113 mmol/L — ABNORMAL HIGH (ref 98–111)
Creatinine, Ser: 2.07 mg/dL — ABNORMAL HIGH (ref 0.44–1.00)
GFR, Estimated: 27 mL/min — ABNORMAL LOW (ref >60)
Glucose, Bld: 101 mg/dL — ABNORMAL HIGH (ref 70–99)
Potassium: 4.1 mmol/L (ref 3.5–5.1)
Sodium: 144 mmol/L (ref 135–145)

## 2023-06-21 LAB — CULTURE, OB URINE: Culture: >=100000 — AB

## 2023-06-21 LAB — CBC
HCT: 37.6 % (ref 36.0–46.0)
Hemoglobin: 12.1 g/dL (ref 12.0–15.0)
MCH: 29.8 pg (ref 26.0–34.0)
MCHC: 32.2 g/dL (ref 30.0–36.0)
MCV: 92.6 fL (ref 80.0–100.0)
Platelets: 220 10*3/uL (ref 150–400)
RBC: 4.06 MIL/uL (ref 3.87–5.11)
RDW: 13.6 % (ref 11.5–15.5)
WBC: 6.2 10*3/uL (ref 4.0–10.5)
nRBC: 0 % (ref 0.0–0.2)

## 2023-06-21 LAB — MAGNESIUM: Magnesium: 2.2 mg/dL (ref 1.7–2.4)

## 2023-06-21 MED ORDER — CYANOCOBALAMIN 1000 MCG PO TABS: 1000.0000 ug | ORAL_TABLET | Freq: Every day | ORAL | 2 refills | Status: DC

## 2023-06-21 MED ORDER — PREGABALIN 75 MG PO CAPS: 75.0000 mg | ORAL_CAPSULE | Freq: Three times a day (TID) | ORAL | 0 refills | Status: DC

## 2023-06-21 MED ORDER — FOLIC ACID 1 MG PO TABS: 1.0000 mg | ORAL_TABLET | Freq: Every day | ORAL | 2 refills | Status: AC

## 2023-06-21 MED ORDER — CEFADROXIL 500 MG PO CAPS
500.0000 mg | ORAL_CAPSULE | Freq: Two times a day (BID) | ORAL | Status: DC
Start: 2023-06-21 — End: 2023-06-21
  Administered 2023-06-21: 500 mg via ORAL
  Filled 2023-06-21: qty 1

## 2023-06-21 MED ORDER — CEFADROXIL 500 MG PO CAPS: 500.0000 mg | ORAL_CAPSULE | Freq: Two times a day (BID) | ORAL | 0 refills | Status: AC

## 2023-06-21 NOTE — TOC Transition Note (Signed)
 Transition of Care [facility]) - Discharge Note   Patient Details  Name: Dana [name] MRN: [id number] Date of Birth: Sep 11, 1960  Transition of Care [facility]) CM/SW Contact:  Gertha Ku, LCSW Phone Number: 06/21/2023, 11:11 AM   Clinical Narrative:    CSW spoke with the pt regarding the recommendation for a rolling walker. Pt has no preference for a DME company. A referral was made to [facility] for the walker to be delivered to the pt's room before discharge. Pt requested clothing, and the CSW requested a consult with the chaplain. The pt also requested transportation assistance, CSW contacted [employer] for a ride home. No further TOC needs were identified, TOC was signed off   Final next level of care: Home w Home Health Services Barriers to Discharge: Barriers Resolved   Patient Goals and CMS Choice Patient states their goals for this hospitalization and ongoing recovery are:: return home with home health          Discharge Placement                    Patient and family notified of of transfer: 06/21/23  Discharge Plan and Services Additional resources added to the After Visit Summary for                  DME Arranged: Walker rolling DME Agency: [employer] Date DME Agency Contacted: 06/21/23 Time DME Agency Contacted: [phone] Representative spoke with at DME Agency: jermaine HH Arranged: PT, OT, Nurse's Aide, Social Work [employer] Agency: [facility] Date HH Agency Contacted: 06/21/23 Time HH Agency Contacted: 1111 Representative spoke with at [facility] Agency: kelly  Social Drivers of Health (SDOH) Interventions SDOH Screenings   Food Insecurity: No Food Insecurity (06/18/2023)  Housing: Low Risk  (06/18/2023)  Transportation Needs: No Transportation Needs (06/18/2023)  Utilities: Not At Risk (06/18/2023)  Depression (PHQ2-9): Low Risk  (09/27/2022)  Financial Resource Strain: Low Risk  (09/29/2021)   Received from [facility], [facility]   Physical Activity: Inactive (09/29/2021)   Received from [facility], [facility]  Social Connections: Unknown (10/05/2022)   Received from [facility], [facility]  Stress: No Stress Concern Present (09/29/2021)   Received from [facility], [facility]  Tobacco Use: Medium Risk (06/18/2023)     Readmission Risk Interventions     No data to display

## 2023-06-21 NOTE — Discharge Summary (Signed)
[facility]  Physician Discharge Summary   Patient ID: Dana [name] MRN: [id number] DOB/AGE: 61/23/1962 61 y.o.  Admit date: 06/17/2023 Discharge date: 06/21/2023    PCP: Aliene Beams, MD  DISCHARGE DIAGNOSES:    Acute cystitis   Bipolar disorder (HCC)   HLD (hyperlipidemia)   Essential hypertension   CKD stage 3b, GFR 30-44 ml/min (HCC)   Peripheral polyneuropathy   Generalized weakness   Recurrent falls   Chronic low back pain   History of COPD   DVT (deep venous thrombosis) (HCC)   RECOMMENDATIONS FOR OUTPATIENT FOLLOW UP: Outpatient follow-up with PCP.   Home Health: PT and OT Equipment/Devices: Rolling walker  CODE STATUS: Full code  DISCHARGE CONDITION: fair  Diet recommendation: As before  INITIAL HISTORY: Patient with PMH of chronic back pain, sees neurosurgery and neurology outpatient, neuropathy, COPD, HTN, HLD, CKD 3B sees nephrology outpatient, DVT on Eliquis presented to the hospital with complaints of recurrent fall. Patient reports she had some fever but no chills or burning urination or any other urinary symptoms. No nausea no vomiting. Admitted for cystitis.   HOSPITAL COURSE:   UTI. Has history of E. coli UTI in the past.  Urine culture positive for E. coli.  Sensitivities reviewed.  Will be discharged on cefadroxil. Follows up with urology outpatient as well.   Recurrent fall. Chronic neuropathy. Chronic back pain. Sees Dr. Kate Sable outpatient. Recently had injections in her spine on both side. Per PT evaluation patient had some right lean and right-sided preference. MRI T-spine and L-spine unremarkable for any significant cord abnormality or any acute finding. Right lean could be coming from her pain. MRI brain negative for any acute stroke.  X-ray hip and pelvis also negative for any acute fractures.  X-ray coccyx negative for any fracture as well. Patient reassured.  Follow-up with neurosurgery in the outpatient setting.    Chronic pain. Acute coccygeal pain. Recurrent fall. Patient reports severe coccyx pain after her fall last Wednesday on 1/8. X-ray hip and x-ray sacrum negative for any acute abnormality. Patient is on gabapentin.  Tells me that this is not adequate. Currently switched to Lyrica.   Mood disorder. On Depakote.  On Xanax.  Continue.   GERD. Continue PPI.   HLD. Continue Crestor 40 mg.   Reported history of COPD. Currently no evidence of exacerbation. Continue as needed inhalers.   Essential hypertension Blood pressure stable. On Lopressor.    CKD 3B. Baseline serum creatinine around 1.5-1 2.1. Currently serum creatinine stable. Follows up with nephrology outpatient.     History of DVT. On Eliquis.     Obesity Class 3 Body mass index is 41.11 kg/m.  Placing the pt at higher risk of poor outcomes.      Patient is stable.  Okay for discharge today.  Declines SNF placement.  Home health has been ordered.   PERTINENT LABS:  The results of significant diagnostics from this hospitalization (including imaging, microbiology, ancillary and laboratory) are listed below for reference.    Microbiology: Recent Results (from the past 240 hours)  Culture, OB Urine     Status: Abnormal   Collection Time: 06/18/23  6:52 AM   Specimen: Urine, Random  Result Value Ref Range Status   Specimen Description   Final    URINE, RANDOM Performed at [facility], [address] [place], [place] [address]    Special Requests   Final    NONE Performed at [facility], [address]  [place], [place] [address]    Culture (A)  Final    >=100,000 COLONIES/mL ESCHERICHIA COLI NO GROUP B STREP (S.AGALACTIAE) ISOLATED Performed at [facility], [address], [place], [place] [address]    Report Status 06/21/2023 FINAL  Final   Organism ID, Bacteria ESCHERICHIA COLI (A)  Final      Susceptibility   Escherichia coli - MIC*    AMPICILLIN  >=32 RESISTANT Resistant     CEFEPIME <=0.12 SENSITIVE Sensitive     CEFTRIAXONE <=0.25 SENSITIVE Sensitive     CIPROFLOXACIN <=0.25 SENSITIVE Sensitive     GENTAMICIN <=1 SENSITIVE Sensitive     IMIPENEM <=0.25 SENSITIVE Sensitive     NITROFURANTOIN <=16 SENSITIVE Sensitive     TRIMETH/SULFA <=20 SENSITIVE Sensitive     AMPICILLIN/SULBACTAM 16 INTERMEDIATE Intermediate     PIP/TAZO <=4 SENSITIVE Sensitive ug/mL    CEFAZOLIN Value in next row Sensitive      SENSITIVE4.0    * >=100,000 COLONIES/mL ESCHERICHIA COLI     Labs:   Basic Metabolic Panel: Recent Labs  Lab 06/17/23 1902 06/18/23 0436 06/19/23 0920 06/21/23 0415  NA 143 144 143 144  K 4.3 4.0 4.3 4.1  CL 109 114* 113* 113*  CO2 20* 21* 20* 22  GLUCOSE 106* 112* 121* 101*  BUN 22* 22* 26* 31*  CREATININE 1.58* 2.14* 2.04* 2.07*  CALCIUM 9.2 9.5 9.3 9.3  MG  --  2.3  --  2.2  PHOS  --   --  3.6  --    Liver Function Tests: Recent Labs  Lab 06/18/23 0436 06/19/23 0920  AST 12*  --   ALT 16  --   ALKPHOS 67  --   BILITOT 0.5  --   PROT 6.8  --   ALBUMIN 3.0* 2.9*    CBC: Recent Labs  Lab 06/17/23 1902 06/18/23 0436 06/19/23 0920 06/21/23 0415  WBC 7.5 6.1 5.6 6.2  NEUTROABS  --  4.0 3.3  --   HGB 12.9 11.9* 12.0 12.1  HCT 39.0 36.0 38.3 37.6  MCV 92.6 91.1 94.6 92.6  PLT 176 182 200 220   Cardiac Enzymes: Recent Labs  Lab 06/18/23 0436  CKTOTAL 22*     CBG: Recent Labs  Lab 06/17/23 1953  GLUCAP 100*     IMAGING STUDIES MR BRAIN WO CONTRAST Result Date: 06/18/2023 CLINICAL DATA:  Initial evaluation for acute neuro deficit, stroke suspected. EXAM: MRI HEAD WITHOUT CONTRAST TECHNIQUE: Multiplanar, multiecho pulse sequences of the brain and surrounding structures were obtained without intravenous contrast. COMPARISON:  Prior CT from 06/17/2023. FINDINGS: Brain: Mild age-related cerebral atrophy. No significant cerebral white matter disease. No abnormal foci of restricted diffusion to  suggest acute or subacute ischemia. Gray-white matter differentiation maintained. No areas of chronic cortical infarction. No acute or chronic intracranial blood products. No mass lesion, midline shift or mass effect. No hydrocephalus or extra-axial fluid collection. Pituitary gland suprasellar region within normal limits. Vascular: Major intracranial vascular flow voids are maintained. Skull and upper cervical spine: Craniocervical junction within normal limits. Bone marrow signal intensity normal. Small focus of susceptibility artifact emanating from the right frontal scalp noted. Scalp soft tissues otherwise unremarkable. Sinuses/Orbits: Globes orbital soft tissues within normal limits. Mild mucosal thickening present at the ethmoidal air cells. Paranasal sinuses are otherwise largely clear. Small to moderate left mastoid effusion, likely benign/sterile. Negative nasopharynx. Other: None. IMPRESSION: 1. No acute intracranial abnormality. 2. Mild age-related cerebral atrophy. Electronically Signed   By: Sharlet Salina  Phill Myron M.D.   On: 06/18/2023 21:14   DG HIPS BILAT WITH PELVIS 2V Result Date: 06/18/2023 CLINICAL DATA:  Pain post fall EXAM: DG HIP (WITH OR WITHOUT PELVIS) 2V BILAT COMPARISON:  CT 05/14/2023 FINDINGS: Bony pelvis intact. Stable postop changes of left hip arthroplasty. No fracture or dislocation. Moderate right hip DJD. Lumbar spondylitic changes L4-S1. IMPRESSION: 1. No acute findings. 2. Left hip arthroplasty. Electronically Signed   By: Corlis Leak M.D.   On: 06/18/2023 17:31   DG Sacrum/Coccyx Result Date: 06/18/2023 CLINICAL DATA:  Pain post fall EXAM: SACRUM AND COCCYX - 2+ VIEW COMPARISON:  12/03/2016 FINDINGS: There is no evidence of fracture or other focal bone lesions. Degenerative disc disease L4-5. Left hip arthroplasty components partially visualized. IMPRESSION: 1. No acute findings. 2. L4-5 degenerative disc disease. Electronically Signed   By: Corlis Leak M.D.   On: 06/18/2023  17:28   MR LUMBAR SPINE WO CONTRAST Result Date: 06/17/2023 CLINICAL DATA:  Back trauma. Abnormal neurologic examination. Weakness. Can not walk. EXAM: MRI THORACIC AND LUMBAR SPINE WITHOUT CONTRAST TECHNIQUE: Multiplanar and multiecho pulse sequences of the thoracic and lumbar spine were obtained without intravenous contrast. COMPARISON:  Prior MRI lumbar spine 08/25/2022. FINDINGS: MRI THORACIC SPINE FINDINGS Alignment:  Normal Vertebrae: Normal marrow signal.  No bone lesions or fractures. Cord:  Normal cord signal intensity.  No cord lesions or syrinx. Paraspinal and other soft tissues: No significant paraspinal findings. No paraspinal hematoma. No posterior mediastinal process or posterior lung abnormality. Disc levels: No thoracic disc protrusions, spinal or foraminal stenosis. Small perineural cysts or dilated nerve root sheaths are noted on the left at T9 and on the right at T11. MRI LUMBAR SPINE FINDINGS Segmentation: There are five lumbar type vertebral bodies. The last full intervertebral disc space is labeled L5-S1. This correlates with the prior MRI. Alignment:  Normal Vertebrae:  Normal marrow signal.  No bone lesions or fractures. Conus medullaris and cauda equina: Conus extends to the bottom of L1 level. Conus and cauda equina appear normal. Paraspinal and other soft tissues: Numerous bilateral renal cysts are noted. No change since CT scan of 2023. The aorta is normal in caliber. No retroperitoneal mass or adenopathy. Disc levels: T12-L1: Mild annular bulge and slight osteophytic ridging but no spinal or foraminal stenosis. L1-2: Mild facet disease but no disc protrusions, spinal or foraminal stenosis. L2-3: Mild facet disease but no spinal or foraminal stenosis. L3-4: Mild diffuse annular bulge, moderate facet disease and ligamentum flavum thickening contributing to mild bilateral lateral recess stenosis and early spinal stenosis. No foraminal stenosis. L4-5: Bulging annulus, osteophytic ridging  and facet disease contributing to moderate right and mild left lateral recess stenosis. No spinal or foraminal stenosis. L5-S1: Bulging annulus, osteophytic ridging and facet disease with mild bilateral lateral recess encroachment. No significant spinal or foraminal stenosis. IMPRESSION: 1. Normal MRI appearance of the thoracic spine and spinal cord. 2. No thoracic disc protrusions, spinal or foraminal stenosis. 3. Small perineural cysts or dilated nerve root sheaths on the left at T9 and on the right at T11. 4. Mild bilateral lateral recess stenosis and early spinal stenosis at L3-4. 5. Moderate right and mild left lateral recess stenosis at L4-5. 6. Mild bilateral lateral recess encroachment at L5-S1. Electronically Signed   By: Rudie Meyer M.D.   On: 06/17/2023 23:13   MR THORACIC SPINE WO CONTRAST Result Date: 06/17/2023 CLINICAL DATA:  Back trauma. Abnormal neurologic examination. Weakness. Can not walk. EXAM: MRI THORACIC AND LUMBAR SPINE WITHOUT  CONTRAST TECHNIQUE: Multiplanar and multiecho pulse sequences of the thoracic and lumbar spine were obtained without intravenous contrast. COMPARISON:  Prior MRI lumbar spine 08/25/2022. FINDINGS: MRI THORACIC SPINE FINDINGS Alignment:  Normal Vertebrae: Normal marrow signal.  No bone lesions or fractures. Cord:  Normal cord signal intensity.  No cord lesions or syrinx. Paraspinal and other soft tissues: No significant paraspinal findings. No paraspinal hematoma. No posterior mediastinal process or posterior lung abnormality. Disc levels: No thoracic disc protrusions, spinal or foraminal stenosis. Small perineural cysts or dilated nerve root sheaths are noted on the left at T9 and on the right at T11. MRI LUMBAR SPINE FINDINGS Segmentation: There are five lumbar type vertebral bodies. The last full intervertebral disc space is labeled L5-S1. This correlates with the prior MRI. Alignment:  Normal Vertebrae:  Normal marrow signal.  No bone lesions or fractures.  Conus medullaris and cauda equina: Conus extends to the bottom of L1 level. Conus and cauda equina appear normal. Paraspinal and other soft tissues: Numerous bilateral renal cysts are noted. No change since CT scan of 2023. The aorta is normal in caliber. No retroperitoneal mass or adenopathy. Disc levels: T12-L1: Mild annular bulge and slight osteophytic ridging but no spinal or foraminal stenosis. L1-2: Mild facet disease but no disc protrusions, spinal or foraminal stenosis. L2-3: Mild facet disease but no spinal or foraminal stenosis. L3-4: Mild diffuse annular bulge, moderate facet disease and ligamentum flavum thickening contributing to mild bilateral lateral recess stenosis and early spinal stenosis. No foraminal stenosis. L4-5: Bulging annulus, osteophytic ridging and facet disease contributing to moderate right and mild left lateral recess stenosis. No spinal or foraminal stenosis. L5-S1: Bulging annulus, osteophytic ridging and facet disease with mild bilateral lateral recess encroachment. No significant spinal or foraminal stenosis. IMPRESSION: 1. Normal MRI appearance of the thoracic spine and spinal cord. 2. No thoracic disc protrusions, spinal or foraminal stenosis. 3. Small perineural cysts or dilated nerve root sheaths on the left at T9 and on the right at T11. 4. Mild bilateral lateral recess stenosis and early spinal stenosis at L3-4. 5. Moderate right and mild left lateral recess stenosis at L4-5. 6. Mild bilateral lateral recess encroachment at L5-S1. Electronically Signed   By: Rudie Meyer M.D.   On: 06/17/2023 23:13   CT HEAD WO CONTRAST Result Date: 06/17/2023 CLINICAL DATA:  Blunt facial trauma, fall striking head, anti coagulation. EXAM: CT HEAD WITHOUT CONTRAST TECHNIQUE: Contiguous axial images were obtained from the base of the skull through the vertex without intravenous contrast. RADIATION DOSE REDUCTION: This exam was performed according to the departmental dose-optimization  program which includes automated exposure control, adjustment of the mA and/or kV according to patient size and/or use of iterative reconstruction technique. COMPARISON:  None Available. FINDINGS: Brain: The brainstem, cerebellum, cerebral peduncles, thalami, basal ganglia, basilar cisterns, and ventricular system appear within normal limits. No intracranial hemorrhage, mass lesion, or acute CVA. Vascular: Unremarkable Skull: Unremarkable Sinuses/Orbits: Unremarkable Other: No supplemental non-categorized findings. IMPRESSION: 1. Negative head CT. Electronically Signed   By: Gaylyn Rong M.D.   On: 06/17/2023 20:33   DG Knee Complete 4 Views Right Result Date: 06/17/2023 CLINICAL DATA:  Fall, right knee pain EXAM: RIGHT KNEE - COMPLETE 4+ VIEW COMPARISON:  None Available. FINDINGS: No fracture or dislocation is seen. Mild tricompartmental degenerative changes, most prominent in the patellofemoral compartment. Visualized soft tissues are within normal limits. No suprapatellar knee joint effusion. IMPRESSION: Negative. Electronically Signed   By: Charline Bills M.D.   On:  06/17/2023 19:38    DISCHARGE EXAMINATION: Vitals:   06/20/23 2007 06/21/23 0418 06/21/23 0622 06/21/23 1237  BP: 119/74 115/89  (!) 123/93  Pulse: 82 79  91  Resp: 18 19  18   Temp: 97.9 F (36.6 C) (!) 97.5 F (36.4 C)  (!) 97.4 F (36.3 C)  TempSrc: Oral Oral  Oral  SpO2: 97% 95%  97%  Weight:   117.3 kg   Height:       General appearance: Awake alert.  In no distress Resp: Clear to auscultation bilaterally.  Normal effort Cardio: S1-S2 is normal regular.  No S3-S4.  No rubs murmurs or bruit GI: Abdomen is soft.  Nontender nondistended.  Bowel sounds are present normal.  No masses organomegaly Moving her extremities.  Physical deconditioning is noted.  DISPOSITION: Home  Discharge Instructions     Call MD for:  difficulty breathing, headache or visual disturbances   Complete by: As directed    Call MD for:   extreme fatigue   Complete by: As directed    Call MD for:  persistant dizziness or light-headedness   Complete by: As directed    Call MD for:  persistant nausea and vomiting   Complete by: As directed    Call MD for:  severe uncontrolled pain   Complete by: As directed    Call MD for:  temperature >100.4   Complete by: As directed    Diet - low sodium heart healthy   Complete by: As directed    Discharge instructions   Complete by: As directed    Please take your medications as prescribed.  Please be sure to follow-up with your primary care provider  You were cared for by a hospitalist during your hospital stay. If you have any questions about your discharge medications or the care you received while you were in the hospital after you are discharged, you can call the unit and asked to speak with the hospitalist on call if the hospitalist that took care of you is not available. Once you are discharged, your primary care physician will handle any further medical issues. Please note that NO REFILLS for any discharge medications will be authorized once you are discharged, as it is imperative that you return to your primary care physician (or establish a relationship with a primary care physician if you do not have one) for your aftercare needs so that they can reassess your need for medications and monitor your lab values. If you do not have a primary care physician, you can call [phone] for a physician referral.   Increase activity slowly   Complete by: As directed          Allergies as of 06/21/2023       Reactions   Anesthetics, Amide Nausea And Vomiting   Other    Oxybutynin Other (See Comments)   Hyoscyamine    Codeine Nausea And Vomiting, Nausea Only        Medication List     STOP taking these medications    amoxicillin-clavulanate 500-125 MG tablet Commonly known as: AUGMENTIN   cefdinir 300 MG capsule Commonly known as: OMNICEF   cephALEXin 500 MG  capsule Commonly known as: KEFLEX   dextromethorphan-guaiFENesin 30-600 MG 12hr tablet Commonly known as: MUCINEX DM   doxycycline 100 MG capsule Commonly known as: VIBRAMYCIN   furosemide 20 MG tablet Commonly known as: LASIX   gabapentin 300 MG capsule Commonly known as: NEURONTIN   lidocaine 2 % solution Commonly  known as: XYLOCAINE   methylPREDNISolone 4 MG Tbpk tablet Commonly known as: MEDROL DOSEPAK   nystatin 100000 UNIT/ML suspension Commonly known as: MYCOSTATIN   predniSONE 20 MG tablet Commonly known as: DELTASONE       TAKE these medications    ALPRAZolam 0.5 MG tablet Commonly known as: XANAX Take 1 tablet (0.5 mg total) by mouth 2 (two) times daily as needed for anxiety.   amitriptyline 25 MG tablet Commonly known as: ELAVIL Take 25 mg by mouth at bedtime.   cefadroxil 500 MG capsule Commonly known as: DURICEF Take 1 capsule (500 mg total) by mouth 2 (two) times daily for 4 days.   cyanocobalamin 1000 MCG tablet Take 1 tablet (1,000 mcg total) by mouth daily.   cyclobenzaprine 10 MG tablet Commonly known as: FLEXERIL Take 10 mg by mouth 3 (three) times daily as needed for muscle spasms.   divalproex 500 MG 24 hr tablet Commonly known as: DEPAKOTE ER Take 1 tablet (500 mg total) by mouth at bedtime. Take with a 250 mg tablet to equal total dose of 750 mg   divalproex 250 MG 24 hr tablet Commonly known as: Depakote ER Take 1 tablet (250 mg total) by mouth daily. Take with a 500 mg tablet to equal total dose of 750 mg   Eliquis 2.5 MG Tabs tablet Generic drug: apixaban TAKE 1 TABLET(2.5 MG) BY MOUTH TWICE DAILY   esomeprazole 40 MG capsule Commonly known as: NEXIUM Take 40 mg by mouth 2 (two) times daily before a meal.   fluticasone 50 MCG/ACT nasal spray Commonly known as: FLONASE Place 1 spray into the nose daily as needed for allergies or rhinitis.   folic acid 1 MG tablet Commonly known as: FOLVITE Take 1 tablet (1 mg total) by  mouth daily.   hydrOXYzine 25 MG tablet Commonly known as: ATARAX Take 25 mg by mouth at bedtime.   ipratropium 0.06 % nasal spray Commonly known as: ATROVENT Place 2 sprays into both nostrils 4 (four) times daily as needed for rhinitis.   metoprolol tartrate 25 MG tablet Commonly known as: LOPRESSOR TAKE 1 TABLET(25 MG) BY MOUTH TWICE DAILY   ondansetron 4 MG tablet Commonly known as: ZOFRAN as needed.   oxyCODONE 5 MG immediate release tablet Commonly known as: Oxy IR/ROXICODONE Take 5 mg by mouth in the morning and at bedtime.   pregabalin 75 MG capsule Commonly known as: LYRICA Take 1 capsule (75 mg total) by mouth 3 (three) times daily.   rizatriptan 10 MG tablet Commonly known as: MAXALT Take by mouth.   rosuvastatin 40 MG tablet Commonly known as: CRESTOR Take 1 tablet (40 mg total) by mouth daily.   tamsulosin 0.4 MG Caps capsule Commonly known as: FLOMAX Take 0.4 mg by mouth daily.   Tylenol 325 MG Caps Generic drug: Acetaminophen 3 capsules          Follow-up Information     [facility] Follow up.   Specialty: Home Health Services Why: Home Health will follow up with you 24hr to 48hrs after discharge. Contact information: [address] [place] [address] [phone]         Aliene Beams, MD. Schedule an appointment as soon as possible for a visit in 1 week(s).   Specialty: Family Medicine Why: post hospitalization follow up Contact information: [address] [place] [address] [phone]  TOTAL DISCHARGE TIME: 35 minutes  [employer] [employer] on [email or web address]  06/22/2023, 11:43 AM

## 2023-06-21 NOTE — Plan of Care (Signed)
  Problem: Coping: Goal: Level of anxiety will decrease Outcome: Progressing   Problem: Pain Management: Goal: General experience of comfort will improve Outcome: Progressing   Problem: Safety: Goal: Ability to remain free from injury will improve Outcome: Progressing   Problem: Skin Integrity: Goal: Risk for impaired skin integrity will decrease Outcome: Progressing

## 2023-06-23 DIAGNOSIS — N3000 Acute cystitis without hematuria: Secondary | ICD-10-CM | POA: Diagnosis not present

## 2023-06-23 DIAGNOSIS — M519 Unspecified thoracic, thoracolumbar and lumbosacral intervertebral disc disorder: Secondary | ICD-10-CM | POA: Diagnosis not present

## 2023-06-23 DIAGNOSIS — R292 Abnormal reflex: Secondary | ICD-10-CM | POA: Diagnosis not present

## 2023-06-23 DIAGNOSIS — N1832 Chronic kidney disease, stage 3b: Secondary | ICD-10-CM | POA: Diagnosis not present

## 2023-06-23 DIAGNOSIS — Z6841 Body Mass Index (BMI) 40.0 and over, adult: Secondary | ICD-10-CM | POA: Diagnosis not present

## 2023-06-23 DIAGNOSIS — B9620 Unspecified Escherichia coli [E. coli] as the cause of diseases classified elsewhere: Secondary | ICD-10-CM | POA: Diagnosis not present

## 2023-06-23 DIAGNOSIS — G894 Chronic pain syndrome: Secondary | ICD-10-CM | POA: Diagnosis not present

## 2023-06-23 DIAGNOSIS — I129 Hypertensive chronic kidney disease with stage 1 through stage 4 chronic kidney disease, or unspecified chronic kidney disease: Secondary | ICD-10-CM | POA: Diagnosis not present

## 2023-06-23 DIAGNOSIS — M542 Cervicalgia: Secondary | ICD-10-CM | POA: Diagnosis not present

## 2023-06-23 DIAGNOSIS — M47812 Spondylosis without myelopathy or radiculopathy, cervical region: Secondary | ICD-10-CM | POA: Diagnosis not present

## 2023-06-23 DIAGNOSIS — J449 Chronic obstructive pulmonary disease, unspecified: Secondary | ICD-10-CM | POA: Diagnosis not present

## 2023-06-23 DIAGNOSIS — F39 Unspecified mood [affective] disorder: Secondary | ICD-10-CM | POA: Diagnosis not present

## 2023-06-23 DIAGNOSIS — F319 Bipolar disorder, unspecified: Secondary | ICD-10-CM | POA: Diagnosis not present

## 2023-06-23 DIAGNOSIS — G629 Polyneuropathy, unspecified: Secondary | ICD-10-CM | POA: Diagnosis not present

## 2023-06-26 DIAGNOSIS — N1832 Chronic kidney disease, stage 3b: Secondary | ICD-10-CM | POA: Diagnosis not present

## 2023-06-26 DIAGNOSIS — F39 Unspecified mood [affective] disorder: Secondary | ICD-10-CM | POA: Diagnosis not present

## 2023-06-26 DIAGNOSIS — B9620 Unspecified Escherichia coli [E. coli] as the cause of diseases classified elsewhere: Secondary | ICD-10-CM | POA: Diagnosis not present

## 2023-06-26 DIAGNOSIS — N3000 Acute cystitis without hematuria: Secondary | ICD-10-CM | POA: Diagnosis not present

## 2023-06-26 DIAGNOSIS — G629 Polyneuropathy, unspecified: Secondary | ICD-10-CM | POA: Diagnosis not present

## 2023-06-26 DIAGNOSIS — I129 Hypertensive chronic kidney disease with stage 1 through stage 4 chronic kidney disease, or unspecified chronic kidney disease: Secondary | ICD-10-CM | POA: Diagnosis not present

## 2023-06-26 DIAGNOSIS — M519 Unspecified thoracic, thoracolumbar and lumbosacral intervertebral disc disorder: Secondary | ICD-10-CM | POA: Diagnosis not present

## 2023-06-26 DIAGNOSIS — F319 Bipolar disorder, unspecified: Secondary | ICD-10-CM | POA: Diagnosis not present

## 2023-06-26 DIAGNOSIS — J449 Chronic obstructive pulmonary disease, unspecified: Secondary | ICD-10-CM | POA: Diagnosis not present

## 2023-06-27 ENCOUNTER — Other Ambulatory Visit: Payer: Self-pay | Admitting: Neurosurgery

## 2023-06-27 DIAGNOSIS — M47812 Spondylosis without myelopathy or radiculopathy, cervical region: Secondary | ICD-10-CM

## 2023-06-29 ENCOUNTER — Encounter: Payer: Self-pay | Admitting: Neurosurgery

## 2023-06-30 DIAGNOSIS — S300XXA Contusion of lower back and pelvis, initial encounter: Secondary | ICD-10-CM | POA: Diagnosis not present

## 2023-06-30 DIAGNOSIS — N3000 Acute cystitis without hematuria: Secondary | ICD-10-CM | POA: Diagnosis not present

## 2023-06-30 DIAGNOSIS — Z742 Need for assistance at home and no other household member able to render care: Secondary | ICD-10-CM | POA: Diagnosis not present

## 2023-06-30 DIAGNOSIS — R296 Repeated falls: Secondary | ICD-10-CM | POA: Diagnosis not present

## 2023-07-04 ENCOUNTER — Other Ambulatory Visit: Payer: Medicare PPO

## 2023-07-04 DIAGNOSIS — R3915 Urgency of urination: Secondary | ICD-10-CM | POA: Diagnosis not present

## 2023-07-04 DIAGNOSIS — N390 Urinary tract infection, site not specified: Secondary | ICD-10-CM | POA: Diagnosis not present

## 2023-07-05 DIAGNOSIS — G894 Chronic pain syndrome: Secondary | ICD-10-CM | POA: Diagnosis not present

## 2023-07-05 DIAGNOSIS — R292 Abnormal reflex: Secondary | ICD-10-CM | POA: Diagnosis not present

## 2023-07-05 DIAGNOSIS — M47812 Spondylosis without myelopathy or radiculopathy, cervical region: Secondary | ICD-10-CM | POA: Diagnosis not present

## 2023-07-05 DIAGNOSIS — M5416 Radiculopathy, lumbar region: Secondary | ICD-10-CM | POA: Diagnosis not present

## 2023-07-06 DIAGNOSIS — F319 Bipolar disorder, unspecified: Secondary | ICD-10-CM | POA: Diagnosis not present

## 2023-07-06 DIAGNOSIS — F39 Unspecified mood [affective] disorder: Secondary | ICD-10-CM | POA: Diagnosis not present

## 2023-07-06 DIAGNOSIS — I129 Hypertensive chronic kidney disease with stage 1 through stage 4 chronic kidney disease, or unspecified chronic kidney disease: Secondary | ICD-10-CM | POA: Diagnosis not present

## 2023-07-06 DIAGNOSIS — B9620 Unspecified Escherichia coli [E. coli] as the cause of diseases classified elsewhere: Secondary | ICD-10-CM | POA: Diagnosis not present

## 2023-07-06 DIAGNOSIS — G629 Polyneuropathy, unspecified: Secondary | ICD-10-CM | POA: Diagnosis not present

## 2023-07-06 DIAGNOSIS — J449 Chronic obstructive pulmonary disease, unspecified: Secondary | ICD-10-CM | POA: Diagnosis not present

## 2023-07-06 DIAGNOSIS — M519 Unspecified thoracic, thoracolumbar and lumbosacral intervertebral disc disorder: Secondary | ICD-10-CM | POA: Diagnosis not present

## 2023-07-06 DIAGNOSIS — N1832 Chronic kidney disease, stage 3b: Secondary | ICD-10-CM | POA: Diagnosis not present

## 2023-07-06 DIAGNOSIS — N3000 Acute cystitis without hematuria: Secondary | ICD-10-CM | POA: Diagnosis not present

## 2023-07-07 DIAGNOSIS — F39 Unspecified mood [affective] disorder: Secondary | ICD-10-CM | POA: Diagnosis not present

## 2023-07-07 DIAGNOSIS — N1832 Chronic kidney disease, stage 3b: Secondary | ICD-10-CM | POA: Diagnosis not present

## 2023-07-07 DIAGNOSIS — J449 Chronic obstructive pulmonary disease, unspecified: Secondary | ICD-10-CM | POA: Diagnosis not present

## 2023-07-07 DIAGNOSIS — I129 Hypertensive chronic kidney disease with stage 1 through stage 4 chronic kidney disease, or unspecified chronic kidney disease: Secondary | ICD-10-CM | POA: Diagnosis not present

## 2023-07-07 DIAGNOSIS — M519 Unspecified thoracic, thoracolumbar and lumbosacral intervertebral disc disorder: Secondary | ICD-10-CM | POA: Diagnosis not present

## 2023-07-07 DIAGNOSIS — G629 Polyneuropathy, unspecified: Secondary | ICD-10-CM | POA: Diagnosis not present

## 2023-07-07 DIAGNOSIS — N3000 Acute cystitis without hematuria: Secondary | ICD-10-CM | POA: Diagnosis not present

## 2023-07-07 DIAGNOSIS — F319 Bipolar disorder, unspecified: Secondary | ICD-10-CM | POA: Diagnosis not present

## 2023-07-07 DIAGNOSIS — B9620 Unspecified Escherichia coli [E. coli] as the cause of diseases classified elsewhere: Secondary | ICD-10-CM | POA: Diagnosis not present

## 2023-07-10 DIAGNOSIS — I129 Hypertensive chronic kidney disease with stage 1 through stage 4 chronic kidney disease, or unspecified chronic kidney disease: Secondary | ICD-10-CM | POA: Diagnosis not present

## 2023-07-10 DIAGNOSIS — N1832 Chronic kidney disease, stage 3b: Secondary | ICD-10-CM | POA: Diagnosis not present

## 2023-07-10 DIAGNOSIS — M519 Unspecified thoracic, thoracolumbar and lumbosacral intervertebral disc disorder: Secondary | ICD-10-CM | POA: Diagnosis not present

## 2023-07-10 DIAGNOSIS — F39 Unspecified mood [affective] disorder: Secondary | ICD-10-CM | POA: Diagnosis not present

## 2023-07-10 DIAGNOSIS — N3000 Acute cystitis without hematuria: Secondary | ICD-10-CM | POA: Diagnosis not present

## 2023-07-10 DIAGNOSIS — N184 Chronic kidney disease, stage 4 (severe): Secondary | ICD-10-CM | POA: Diagnosis not present

## 2023-07-10 DIAGNOSIS — B9620 Unspecified Escherichia coli [E. coli] as the cause of diseases classified elsewhere: Secondary | ICD-10-CM | POA: Diagnosis not present

## 2023-07-10 DIAGNOSIS — G629 Polyneuropathy, unspecified: Secondary | ICD-10-CM | POA: Diagnosis not present

## 2023-07-10 DIAGNOSIS — F319 Bipolar disorder, unspecified: Secondary | ICD-10-CM | POA: Diagnosis not present

## 2023-07-10 DIAGNOSIS — J449 Chronic obstructive pulmonary disease, unspecified: Secondary | ICD-10-CM | POA: Diagnosis not present

## 2023-07-12 ENCOUNTER — Encounter: Payer: Self-pay | Admitting: *Deleted

## 2023-07-17 DIAGNOSIS — Z79899 Other long term (current) drug therapy: Secondary | ICD-10-CM | POA: Diagnosis not present

## 2023-07-17 DIAGNOSIS — R296 Repeated falls: Secondary | ICD-10-CM | POA: Diagnosis not present

## 2023-07-18 DIAGNOSIS — I129 Hypertensive chronic kidney disease with stage 1 through stage 4 chronic kidney disease, or unspecified chronic kidney disease: Secondary | ICD-10-CM | POA: Diagnosis not present

## 2023-07-18 DIAGNOSIS — J449 Chronic obstructive pulmonary disease, unspecified: Secondary | ICD-10-CM | POA: Diagnosis not present

## 2023-07-18 DIAGNOSIS — F319 Bipolar disorder, unspecified: Secondary | ICD-10-CM | POA: Diagnosis not present

## 2023-07-18 DIAGNOSIS — B9620 Unspecified Escherichia coli [E. coli] as the cause of diseases classified elsewhere: Secondary | ICD-10-CM | POA: Diagnosis not present

## 2023-07-18 DIAGNOSIS — F39 Unspecified mood [affective] disorder: Secondary | ICD-10-CM | POA: Diagnosis not present

## 2023-07-18 DIAGNOSIS — G629 Polyneuropathy, unspecified: Secondary | ICD-10-CM | POA: Diagnosis not present

## 2023-07-18 DIAGNOSIS — N1832 Chronic kidney disease, stage 3b: Secondary | ICD-10-CM | POA: Diagnosis not present

## 2023-07-18 DIAGNOSIS — N3000 Acute cystitis without hematuria: Secondary | ICD-10-CM | POA: Diagnosis not present

## 2023-07-18 DIAGNOSIS — M519 Unspecified thoracic, thoracolumbar and lumbosacral intervertebral disc disorder: Secondary | ICD-10-CM | POA: Diagnosis not present

## 2023-07-20 DIAGNOSIS — N3000 Acute cystitis without hematuria: Secondary | ICD-10-CM | POA: Diagnosis not present

## 2023-07-20 DIAGNOSIS — N1832 Chronic kidney disease, stage 3b: Secondary | ICD-10-CM | POA: Diagnosis not present

## 2023-07-20 DIAGNOSIS — F319 Bipolar disorder, unspecified: Secondary | ICD-10-CM | POA: Diagnosis not present

## 2023-07-20 DIAGNOSIS — G629 Polyneuropathy, unspecified: Secondary | ICD-10-CM | POA: Diagnosis not present

## 2023-07-20 DIAGNOSIS — J449 Chronic obstructive pulmonary disease, unspecified: Secondary | ICD-10-CM | POA: Diagnosis not present

## 2023-07-20 DIAGNOSIS — F39 Unspecified mood [affective] disorder: Secondary | ICD-10-CM | POA: Diagnosis not present

## 2023-07-20 DIAGNOSIS — M519 Unspecified thoracic, thoracolumbar and lumbosacral intervertebral disc disorder: Secondary | ICD-10-CM | POA: Diagnosis not present

## 2023-07-20 DIAGNOSIS — B9620 Unspecified Escherichia coli [E. coli] as the cause of diseases classified elsewhere: Secondary | ICD-10-CM | POA: Diagnosis not present

## 2023-07-20 DIAGNOSIS — I129 Hypertensive chronic kidney disease with stage 1 through stage 4 chronic kidney disease, or unspecified chronic kidney disease: Secondary | ICD-10-CM | POA: Diagnosis not present

## 2023-07-21 DIAGNOSIS — D631 Anemia in chronic kidney disease: Secondary | ICD-10-CM | POA: Diagnosis not present

## 2023-07-21 DIAGNOSIS — R609 Edema, unspecified: Secondary | ICD-10-CM | POA: Diagnosis not present

## 2023-07-21 DIAGNOSIS — R296 Repeated falls: Secondary | ICD-10-CM | POA: Diagnosis not present

## 2023-07-21 DIAGNOSIS — N390 Urinary tract infection, site not specified: Secondary | ICD-10-CM | POA: Diagnosis not present

## 2023-07-21 DIAGNOSIS — N2581 Secondary hyperparathyroidism of renal origin: Secondary | ICD-10-CM | POA: Diagnosis not present

## 2023-07-21 DIAGNOSIS — N184 Chronic kidney disease, stage 4 (severe): Secondary | ICD-10-CM | POA: Diagnosis not present

## 2023-07-21 DIAGNOSIS — I129 Hypertensive chronic kidney disease with stage 1 through stage 4 chronic kidney disease, or unspecified chronic kidney disease: Secondary | ICD-10-CM | POA: Diagnosis not present

## 2023-07-21 DIAGNOSIS — N1419 Nephropathy induced by other drugs, medicaments and biological substances: Secondary | ICD-10-CM | POA: Diagnosis not present

## 2023-07-21 DIAGNOSIS — T56891A Toxic effect of other metals, accidental (unintentional), initial encounter: Secondary | ICD-10-CM | POA: Diagnosis not present

## 2023-07-23 DIAGNOSIS — J449 Chronic obstructive pulmonary disease, unspecified: Secondary | ICD-10-CM | POA: Diagnosis not present

## 2023-07-23 DIAGNOSIS — G629 Polyneuropathy, unspecified: Secondary | ICD-10-CM | POA: Diagnosis not present

## 2023-07-23 DIAGNOSIS — N1832 Chronic kidney disease, stage 3b: Secondary | ICD-10-CM | POA: Diagnosis not present

## 2023-07-23 DIAGNOSIS — I129 Hypertensive chronic kidney disease with stage 1 through stage 4 chronic kidney disease, or unspecified chronic kidney disease: Secondary | ICD-10-CM | POA: Diagnosis not present

## 2023-07-23 DIAGNOSIS — F39 Unspecified mood [affective] disorder: Secondary | ICD-10-CM | POA: Diagnosis not present

## 2023-07-23 DIAGNOSIS — N3000 Acute cystitis without hematuria: Secondary | ICD-10-CM | POA: Diagnosis not present

## 2023-07-23 DIAGNOSIS — F319 Bipolar disorder, unspecified: Secondary | ICD-10-CM | POA: Diagnosis not present

## 2023-07-23 DIAGNOSIS — B9620 Unspecified Escherichia coli [E. coli] as the cause of diseases classified elsewhere: Secondary | ICD-10-CM | POA: Diagnosis not present

## 2023-07-23 DIAGNOSIS — M519 Unspecified thoracic, thoracolumbar and lumbosacral intervertebral disc disorder: Secondary | ICD-10-CM | POA: Diagnosis not present

## 2023-07-25 DIAGNOSIS — F319 Bipolar disorder, unspecified: Secondary | ICD-10-CM | POA: Diagnosis not present

## 2023-07-25 DIAGNOSIS — I129 Hypertensive chronic kidney disease with stage 1 through stage 4 chronic kidney disease, or unspecified chronic kidney disease: Secondary | ICD-10-CM | POA: Diagnosis not present

## 2023-07-25 DIAGNOSIS — M519 Unspecified thoracic, thoracolumbar and lumbosacral intervertebral disc disorder: Secondary | ICD-10-CM | POA: Diagnosis not present

## 2023-07-25 DIAGNOSIS — F39 Unspecified mood [affective] disorder: Secondary | ICD-10-CM | POA: Diagnosis not present

## 2023-07-25 DIAGNOSIS — J449 Chronic obstructive pulmonary disease, unspecified: Secondary | ICD-10-CM | POA: Diagnosis not present

## 2023-07-25 DIAGNOSIS — N3000 Acute cystitis without hematuria: Secondary | ICD-10-CM | POA: Diagnosis not present

## 2023-07-25 DIAGNOSIS — G629 Polyneuropathy, unspecified: Secondary | ICD-10-CM | POA: Diagnosis not present

## 2023-07-25 DIAGNOSIS — B9620 Unspecified Escherichia coli [E. coli] as the cause of diseases classified elsewhere: Secondary | ICD-10-CM | POA: Diagnosis not present

## 2023-07-25 DIAGNOSIS — N1832 Chronic kidney disease, stage 3b: Secondary | ICD-10-CM | POA: Diagnosis not present

## 2023-08-03 DIAGNOSIS — F319 Bipolar disorder, unspecified: Secondary | ICD-10-CM | POA: Diagnosis not present

## 2023-08-03 DIAGNOSIS — N3000 Acute cystitis without hematuria: Secondary | ICD-10-CM | POA: Diagnosis not present

## 2023-08-03 DIAGNOSIS — F39 Unspecified mood [affective] disorder: Secondary | ICD-10-CM | POA: Diagnosis not present

## 2023-08-03 DIAGNOSIS — R29898 Other symptoms and signs involving the musculoskeletal system: Secondary | ICD-10-CM | POA: Diagnosis not present

## 2023-08-03 DIAGNOSIS — B9620 Unspecified Escherichia coli [E. coli] as the cause of diseases classified elsewhere: Secondary | ICD-10-CM | POA: Diagnosis not present

## 2023-08-03 DIAGNOSIS — M519 Unspecified thoracic, thoracolumbar and lumbosacral intervertebral disc disorder: Secondary | ICD-10-CM | POA: Diagnosis not present

## 2023-08-03 DIAGNOSIS — J449 Chronic obstructive pulmonary disease, unspecified: Secondary | ICD-10-CM | POA: Diagnosis not present

## 2023-08-03 DIAGNOSIS — R296 Repeated falls: Secondary | ICD-10-CM | POA: Diagnosis not present

## 2023-08-03 DIAGNOSIS — G629 Polyneuropathy, unspecified: Secondary | ICD-10-CM | POA: Diagnosis not present

## 2023-08-03 DIAGNOSIS — I129 Hypertensive chronic kidney disease with stage 1 through stage 4 chronic kidney disease, or unspecified chronic kidney disease: Secondary | ICD-10-CM | POA: Diagnosis not present

## 2023-08-03 DIAGNOSIS — N1832 Chronic kidney disease, stage 3b: Secondary | ICD-10-CM | POA: Diagnosis not present

## 2023-08-07 DIAGNOSIS — F319 Bipolar disorder, unspecified: Secondary | ICD-10-CM | POA: Diagnosis not present

## 2023-08-07 DIAGNOSIS — I129 Hypertensive chronic kidney disease with stage 1 through stage 4 chronic kidney disease, or unspecified chronic kidney disease: Secondary | ICD-10-CM | POA: Diagnosis not present

## 2023-08-07 DIAGNOSIS — N3000 Acute cystitis without hematuria: Secondary | ICD-10-CM | POA: Diagnosis not present

## 2023-08-07 DIAGNOSIS — G629 Polyneuropathy, unspecified: Secondary | ICD-10-CM | POA: Diagnosis not present

## 2023-08-07 DIAGNOSIS — N1832 Chronic kidney disease, stage 3b: Secondary | ICD-10-CM | POA: Diagnosis not present

## 2023-08-07 DIAGNOSIS — M519 Unspecified thoracic, thoracolumbar and lumbosacral intervertebral disc disorder: Secondary | ICD-10-CM | POA: Diagnosis not present

## 2023-08-07 DIAGNOSIS — F39 Unspecified mood [affective] disorder: Secondary | ICD-10-CM | POA: Diagnosis not present

## 2023-08-07 DIAGNOSIS — J449 Chronic obstructive pulmonary disease, unspecified: Secondary | ICD-10-CM | POA: Diagnosis not present

## 2023-08-07 DIAGNOSIS — B9620 Unspecified Escherichia coli [E. coli] as the cause of diseases classified elsewhere: Secondary | ICD-10-CM | POA: Diagnosis not present

## 2023-08-09 DIAGNOSIS — M542 Cervicalgia: Secondary | ICD-10-CM | POA: Diagnosis not present

## 2023-08-09 DIAGNOSIS — R200 Anesthesia of skin: Secondary | ICD-10-CM | POA: Diagnosis not present

## 2023-08-09 DIAGNOSIS — M47812 Spondylosis without myelopathy or radiculopathy, cervical region: Secondary | ICD-10-CM | POA: Diagnosis not present

## 2023-08-09 DIAGNOSIS — M4802 Spinal stenosis, cervical region: Secondary | ICD-10-CM | POA: Diagnosis not present

## 2023-08-18 DIAGNOSIS — F319 Bipolar disorder, unspecified: Secondary | ICD-10-CM | POA: Diagnosis not present

## 2023-08-18 DIAGNOSIS — I129 Hypertensive chronic kidney disease with stage 1 through stage 4 chronic kidney disease, or unspecified chronic kidney disease: Secondary | ICD-10-CM | POA: Diagnosis not present

## 2023-08-18 DIAGNOSIS — F39 Unspecified mood [affective] disorder: Secondary | ICD-10-CM | POA: Diagnosis not present

## 2023-08-18 DIAGNOSIS — G629 Polyneuropathy, unspecified: Secondary | ICD-10-CM | POA: Diagnosis not present

## 2023-08-18 DIAGNOSIS — N3000 Acute cystitis without hematuria: Secondary | ICD-10-CM | POA: Diagnosis not present

## 2023-08-18 DIAGNOSIS — B9620 Unspecified Escherichia coli [E. coli] as the cause of diseases classified elsewhere: Secondary | ICD-10-CM | POA: Diagnosis not present

## 2023-08-18 DIAGNOSIS — M519 Unspecified thoracic, thoracolumbar and lumbosacral intervertebral disc disorder: Secondary | ICD-10-CM | POA: Diagnosis not present

## 2023-08-18 DIAGNOSIS — J449 Chronic obstructive pulmonary disease, unspecified: Secondary | ICD-10-CM | POA: Diagnosis not present

## 2023-08-18 DIAGNOSIS — N1832 Chronic kidney disease, stage 3b: Secondary | ICD-10-CM | POA: Diagnosis not present

## 2023-08-20 NOTE — Progress Notes (Deleted)
  Cardiology Office Note:   Date:  08/20/2023  ID:  Dana [name], DOB [birth year], MRN [id number] PCP: Aliene Beams, MD  [facility] Providers Cardiologist:  Rollene Rotunda, MD {  History of Present Illness:   Dana [name] is a 63 y.o. female who presented for evaluation of chest discomfort and palpitations. She had a negative perfusion study in 2023.  She had previously been seen at [facility] she wore a monitor for 3 days.  This demonstrated predominantly normal sinus rhythm.  She had a low supraventricular and ventricular ectopic beat burden.  There was very rare supraventricular tachycardia lasting only 5 beats.  She had normal heart rate variation.  An echocardiogram demonstrated well-preserved ejection fraction.  There were no valvular abnormalities. In Jan 2024 she was in the ED with palpitations and chest pain.   She does have coronary calcium and aortic atherosclerosis noted on a previous CT. she presents with continued palpitations and continuous pain in her heart.  She wore another monitor and she had NSR with PVCS.     Since I last saw her ***   ***  he denies any new cardiovascular.  She is having some lower extremity swelling related to amlodipine that was started by her nephrologist.  She is not having any new shortness of breath, PND or orthopnea.  She is not having any chest pressure, neck or arm discomfort.  She is lost about 7 pounds with diet.  She goes to her grandsons T-ball and her granddaughters softball games and she does a lot of walking there.    ROS: ***  Studies Reviewed:    EKG:       ***  Risk Assessment/Calculations:   {Does this patient have ATRIAL FIBRILLATION?[phone]} No BP recorded.  {Refresh Note OR Click here to enter BP  :1}***        Physical Exam:   VS:  There were no vitals taken for this visit.   Wt Readings from Last 3 Encounters:  06/21/23 258 lb 9.6 oz (117.3 kg)  05/14/23 255 lb (115.7 kg)  04/16/23 250 lb (113.4 kg)      GEN: Well nourished, well developed in no acute distress NECK: No JVD; No carotid bruits CARDIAC: ***RR, *** murmurs, rubs, gallops RESPIRATORY:  Clear to auscultation without rales, wheezing or rhonchi  ABDOMEN: Soft, non-tender, non-distended EXTREMITIES:  No edema; No deformity   ASSESSMENT AND PLAN:   Chest pain:   ***  She is not having any further chest discomfort.  No further workup.  She had a negative perfusion study in the facility in June 2023.   Palpitations:   ***  Beta-blockers have improved her palpitations.  No change in therapy.   Hypertension: Her blood pressure is ***  controlled and she is worried about the edema.  I am going to reduce her amlodipine to 2.5 mg and she can let me know if her blood pressure increases.  It is Controlled.    CKD IIIB:  ***  She follows with nephrology.        DVT:   ***  She continues chronic anticoagulation.    Dyslipidemia:   ***   I increased her Crestor over the phone this week as her LDL was not at the goal of 50.  I will repeat a lipid in 3 months.        Follow up ***  Signed, Rollene Rotunda, MD

## 2023-08-22 ENCOUNTER — Ambulatory Visit: Payer: Self-pay | Admitting: Cardiology

## 2023-08-22 DIAGNOSIS — N1832 Chronic kidney disease, stage 3b: Secondary | ICD-10-CM

## 2023-08-22 DIAGNOSIS — F319 Bipolar disorder, unspecified: Secondary | ICD-10-CM | POA: Diagnosis not present

## 2023-08-22 DIAGNOSIS — R931 Abnormal findings on diagnostic imaging of heart and coronary circulation: Secondary | ICD-10-CM

## 2023-08-22 DIAGNOSIS — F39 Unspecified mood [affective] disorder: Secondary | ICD-10-CM | POA: Diagnosis not present

## 2023-08-22 DIAGNOSIS — R002 Palpitations: Secondary | ICD-10-CM

## 2023-08-22 DIAGNOSIS — B9620 Unspecified Escherichia coli [E. coli] as the cause of diseases classified elsewhere: Secondary | ICD-10-CM | POA: Diagnosis not present

## 2023-08-22 DIAGNOSIS — N3000 Acute cystitis without hematuria: Secondary | ICD-10-CM | POA: Diagnosis not present

## 2023-08-22 DIAGNOSIS — I129 Hypertensive chronic kidney disease with stage 1 through stage 4 chronic kidney disease, or unspecified chronic kidney disease: Secondary | ICD-10-CM | POA: Diagnosis not present

## 2023-08-22 DIAGNOSIS — G629 Polyneuropathy, unspecified: Secondary | ICD-10-CM | POA: Diagnosis not present

## 2023-08-22 DIAGNOSIS — E785 Hyperlipidemia, unspecified: Secondary | ICD-10-CM

## 2023-08-22 DIAGNOSIS — M519 Unspecified thoracic, thoracolumbar and lumbosacral intervertebral disc disorder: Secondary | ICD-10-CM | POA: Diagnosis not present

## 2023-08-22 DIAGNOSIS — J449 Chronic obstructive pulmonary disease, unspecified: Secondary | ICD-10-CM | POA: Diagnosis not present

## 2023-08-25 DIAGNOSIS — F39 Unspecified mood [affective] disorder: Secondary | ICD-10-CM | POA: Diagnosis not present

## 2023-08-25 DIAGNOSIS — N1832 Chronic kidney disease, stage 3b: Secondary | ICD-10-CM | POA: Diagnosis not present

## 2023-08-25 DIAGNOSIS — J449 Chronic obstructive pulmonary disease, unspecified: Secondary | ICD-10-CM | POA: Diagnosis not present

## 2023-08-25 DIAGNOSIS — M519 Unspecified thoracic, thoracolumbar and lumbosacral intervertebral disc disorder: Secondary | ICD-10-CM | POA: Diagnosis not present

## 2023-08-25 DIAGNOSIS — F319 Bipolar disorder, unspecified: Secondary | ICD-10-CM | POA: Diagnosis not present

## 2023-08-25 DIAGNOSIS — I129 Hypertensive chronic kidney disease with stage 1 through stage 4 chronic kidney disease, or unspecified chronic kidney disease: Secondary | ICD-10-CM | POA: Diagnosis not present

## 2023-08-25 DIAGNOSIS — G629 Polyneuropathy, unspecified: Secondary | ICD-10-CM | POA: Diagnosis not present

## 2023-08-25 DIAGNOSIS — B9620 Unspecified Escherichia coli [E. coli] as the cause of diseases classified elsewhere: Secondary | ICD-10-CM | POA: Diagnosis not present

## 2023-08-25 DIAGNOSIS — N3000 Acute cystitis without hematuria: Secondary | ICD-10-CM | POA: Diagnosis not present

## 2023-08-29 DIAGNOSIS — K219 Gastro-esophageal reflux disease without esophagitis: Secondary | ICD-10-CM | POA: Diagnosis not present

## 2023-08-29 DIAGNOSIS — I82509 Chronic embolism and thrombosis of unspecified deep veins of unspecified lower extremity: Secondary | ICD-10-CM | POA: Diagnosis not present

## 2023-08-29 DIAGNOSIS — N184 Chronic kidney disease, stage 4 (severe): Secondary | ICD-10-CM | POA: Diagnosis not present

## 2023-08-29 DIAGNOSIS — Z809 Family history of malignant neoplasm, unspecified: Secondary | ICD-10-CM | POA: Diagnosis not present

## 2023-08-29 DIAGNOSIS — M1990 Unspecified osteoarthritis, unspecified site: Secondary | ICD-10-CM | POA: Diagnosis not present

## 2023-08-29 DIAGNOSIS — J309 Allergic rhinitis, unspecified: Secondary | ICD-10-CM | POA: Diagnosis not present

## 2023-08-29 DIAGNOSIS — F419 Anxiety disorder, unspecified: Secondary | ICD-10-CM | POA: Diagnosis not present

## 2023-08-29 DIAGNOSIS — I471 Supraventricular tachycardia, unspecified: Secondary | ICD-10-CM | POA: Diagnosis not present

## 2023-08-29 DIAGNOSIS — F319 Bipolar disorder, unspecified: Secondary | ICD-10-CM | POA: Diagnosis not present

## 2023-08-29 DIAGNOSIS — J439 Emphysema, unspecified: Secondary | ICD-10-CM | POA: Diagnosis not present

## 2023-08-29 DIAGNOSIS — G629 Polyneuropathy, unspecified: Secondary | ICD-10-CM | POA: Diagnosis not present

## 2023-08-29 DIAGNOSIS — I129 Hypertensive chronic kidney disease with stage 1 through stage 4 chronic kidney disease, or unspecified chronic kidney disease: Secondary | ICD-10-CM | POA: Diagnosis not present

## 2023-08-29 DIAGNOSIS — Z7901 Long term (current) use of anticoagulants: Secondary | ICD-10-CM | POA: Diagnosis not present

## 2023-08-29 DIAGNOSIS — M545 Low back pain, unspecified: Secondary | ICD-10-CM | POA: Diagnosis not present

## 2023-08-29 DIAGNOSIS — G43909 Migraine, unspecified, not intractable, without status migrainosus: Secondary | ICD-10-CM | POA: Diagnosis not present

## 2023-08-29 DIAGNOSIS — Z79899 Other long term (current) drug therapy: Secondary | ICD-10-CM | POA: Diagnosis not present

## 2023-08-29 DIAGNOSIS — E785 Hyperlipidemia, unspecified: Secondary | ICD-10-CM | POA: Diagnosis not present

## 2023-09-01 DIAGNOSIS — N390 Urinary tract infection, site not specified: Secondary | ICD-10-CM | POA: Diagnosis not present

## 2023-09-01 DIAGNOSIS — R609 Edema, unspecified: Secondary | ICD-10-CM | POA: Diagnosis not present

## 2023-09-01 DIAGNOSIS — N1419 Nephropathy induced by other drugs, medicaments and biological substances: Secondary | ICD-10-CM | POA: Diagnosis not present

## 2023-09-01 DIAGNOSIS — D631 Anemia in chronic kidney disease: Secondary | ICD-10-CM | POA: Diagnosis not present

## 2023-09-01 DIAGNOSIS — N2581 Secondary hyperparathyroidism of renal origin: Secondary | ICD-10-CM | POA: Diagnosis not present

## 2023-09-01 DIAGNOSIS — T56891A Toxic effect of other metals, accidental (unintentional), initial encounter: Secondary | ICD-10-CM | POA: Diagnosis not present

## 2023-09-01 DIAGNOSIS — N184 Chronic kidney disease, stage 4 (severe): Secondary | ICD-10-CM | POA: Diagnosis not present

## 2023-09-01 DIAGNOSIS — N189 Chronic kidney disease, unspecified: Secondary | ICD-10-CM | POA: Diagnosis not present

## 2023-09-01 DIAGNOSIS — I129 Hypertensive chronic kidney disease with stage 1 through stage 4 chronic kidney disease, or unspecified chronic kidney disease: Secondary | ICD-10-CM | POA: Diagnosis not present

## 2023-09-01 DIAGNOSIS — E669 Obesity, unspecified: Secondary | ICD-10-CM | POA: Diagnosis not present

## 2023-09-02 LAB — LAB REPORT - SCANNED: EGFR: 25

## 2023-09-04 DIAGNOSIS — G894 Chronic pain syndrome: Secondary | ICD-10-CM | POA: Diagnosis not present

## 2023-09-04 DIAGNOSIS — R2681 Unsteadiness on feet: Secondary | ICD-10-CM | POA: Diagnosis not present

## 2023-09-04 DIAGNOSIS — Z6841 Body Mass Index (BMI) 40.0 and over, adult: Secondary | ICD-10-CM | POA: Diagnosis not present

## 2023-09-06 DIAGNOSIS — F319 Bipolar disorder, unspecified: Secondary | ICD-10-CM | POA: Diagnosis not present

## 2023-09-06 DIAGNOSIS — M519 Unspecified thoracic, thoracolumbar and lumbosacral intervertebral disc disorder: Secondary | ICD-10-CM | POA: Diagnosis not present

## 2023-09-06 DIAGNOSIS — G629 Polyneuropathy, unspecified: Secondary | ICD-10-CM | POA: Diagnosis not present

## 2023-09-06 DIAGNOSIS — J449 Chronic obstructive pulmonary disease, unspecified: Secondary | ICD-10-CM | POA: Diagnosis not present

## 2023-09-06 DIAGNOSIS — N1832 Chronic kidney disease, stage 3b: Secondary | ICD-10-CM | POA: Diagnosis not present

## 2023-09-06 DIAGNOSIS — B9620 Unspecified Escherichia coli [E. coli] as the cause of diseases classified elsewhere: Secondary | ICD-10-CM | POA: Diagnosis not present

## 2023-09-06 DIAGNOSIS — N3000 Acute cystitis without hematuria: Secondary | ICD-10-CM | POA: Diagnosis not present

## 2023-09-06 DIAGNOSIS — I129 Hypertensive chronic kidney disease with stage 1 through stage 4 chronic kidney disease, or unspecified chronic kidney disease: Secondary | ICD-10-CM | POA: Diagnosis not present

## 2023-09-06 DIAGNOSIS — F39 Unspecified mood [affective] disorder: Secondary | ICD-10-CM | POA: Diagnosis not present

## 2023-09-07 DIAGNOSIS — M7061 Trochanteric bursitis, right hip: Secondary | ICD-10-CM | POA: Diagnosis not present

## 2023-09-13 DIAGNOSIS — R2681 Unsteadiness on feet: Secondary | ICD-10-CM | POA: Diagnosis not present

## 2023-09-13 DIAGNOSIS — M5416 Radiculopathy, lumbar region: Secondary | ICD-10-CM | POA: Diagnosis not present

## 2023-09-13 DIAGNOSIS — M47812 Spondylosis without myelopathy or radiculopathy, cervical region: Secondary | ICD-10-CM | POA: Diagnosis not present

## 2023-09-13 DIAGNOSIS — G894 Chronic pain syndrome: Secondary | ICD-10-CM | POA: Diagnosis not present

## 2023-09-14 ENCOUNTER — Encounter: Payer: Self-pay | Admitting: Podiatry

## 2023-09-14 ENCOUNTER — Ambulatory Visit: Admitting: Podiatry

## 2023-09-14 ENCOUNTER — Ambulatory Visit (INDEPENDENT_AMBULATORY_CARE_PROVIDER_SITE_OTHER)

## 2023-09-14 DIAGNOSIS — F319 Bipolar disorder, unspecified: Secondary | ICD-10-CM | POA: Diagnosis not present

## 2023-09-14 DIAGNOSIS — M7750 Other enthesopathy of unspecified foot: Secondary | ICD-10-CM | POA: Diagnosis not present

## 2023-09-14 DIAGNOSIS — R2681 Unsteadiness on feet: Secondary | ICD-10-CM | POA: Diagnosis not present

## 2023-09-14 DIAGNOSIS — M722 Plantar fascial fibromatosis: Secondary | ICD-10-CM

## 2023-09-14 DIAGNOSIS — F411 Generalized anxiety disorder: Secondary | ICD-10-CM | POA: Diagnosis not present

## 2023-09-14 DIAGNOSIS — S92511A Displaced fracture of proximal phalanx of right lesser toe(s), initial encounter for closed fracture: Secondary | ICD-10-CM

## 2023-09-14 DIAGNOSIS — G4700 Insomnia, unspecified: Secondary | ICD-10-CM | POA: Diagnosis not present

## 2023-09-14 NOTE — Progress Notes (Signed)
 Subjective: Chief Complaint  Patient presents with   Foot Pain    RM#12 Bilateral foot numbness unsteady on feet ongoing issue since 12/2033   63 year old female presents the office today with the above concerns.  She is a saw her last she states that she been having ongoing falls.she has follow-up with her neurologist as well as neurosurgeon.  She has been doing classes at the [facility] which to help with her balance.  She is currently using a rollator.  She has been getting swelling to the right fifth toe.  Objective: AAO x3, NAD DP/PT pulses palpable bilaterally, CRT less than 3 seconds Sensation absent with SWFM. Unable to appreciate any area pinpoint tenderness.  Some chronic appearing edema present at the bilateral lower extremities but there is no erythema or warmth.  There is no open lesions.  MMT 4/5. Edema to the right fifth toe with mild tenderness palpation. No pain with calf compression, swelling, warmth, erythema  Assessment: Gait instability, neuropathy like symptoms; right fifth toe fracture; history of tendon repair  Plan: -All treatment options discussed with the patient including all alternatives, risks, complications.  X-rays obtained reviewed bilaterally.  X-rays obtained reviewed.  Healing fracture noted at the fifth toe with mild displacement. -She has previously followed up with her neurologist as well as neurosurgery services for gait instability.  She states that that there is now been the reason why she has the neuropathy like symptoms and numbness to her feet.  She previously had a nerve conduction test as well.  We discussed possibly repeating nerve biopsy if needed.  -For now continue classes to help with balance as well as using a rollator to help prevent falls. -Patient encouraged to call the office with any questions, concerns, change in symptoms.   Charity Conch DPM

## 2023-09-15 DIAGNOSIS — F39 Unspecified mood [affective] disorder: Secondary | ICD-10-CM | POA: Diagnosis not present

## 2023-09-15 DIAGNOSIS — N1832 Chronic kidney disease, stage 3b: Secondary | ICD-10-CM | POA: Diagnosis not present

## 2023-09-15 DIAGNOSIS — N3000 Acute cystitis without hematuria: Secondary | ICD-10-CM | POA: Diagnosis not present

## 2023-09-15 DIAGNOSIS — F319 Bipolar disorder, unspecified: Secondary | ICD-10-CM | POA: Diagnosis not present

## 2023-09-15 DIAGNOSIS — B9620 Unspecified Escherichia coli [E. coli] as the cause of diseases classified elsewhere: Secondary | ICD-10-CM | POA: Diagnosis not present

## 2023-09-15 DIAGNOSIS — G629 Polyneuropathy, unspecified: Secondary | ICD-10-CM | POA: Diagnosis not present

## 2023-09-15 DIAGNOSIS — J449 Chronic obstructive pulmonary disease, unspecified: Secondary | ICD-10-CM | POA: Diagnosis not present

## 2023-09-15 DIAGNOSIS — I129 Hypertensive chronic kidney disease with stage 1 through stage 4 chronic kidney disease, or unspecified chronic kidney disease: Secondary | ICD-10-CM | POA: Diagnosis not present

## 2023-09-15 DIAGNOSIS — M519 Unspecified thoracic, thoracolumbar and lumbosacral intervertebral disc disorder: Secondary | ICD-10-CM | POA: Diagnosis not present

## 2023-09-22 DIAGNOSIS — N3941 Urge incontinence: Secondary | ICD-10-CM | POA: Diagnosis not present

## 2023-09-27 ENCOUNTER — Telehealth: Payer: Self-pay

## 2023-09-27 NOTE — Telephone Encounter (Signed)
 Patient called and left a message - she is wondering about the tests you talked about at her visit - I did not see an order for NCV - did you want to do a nerve biopsy?

## 2023-09-28 NOTE — Telephone Encounter (Signed)
 Gave a kit to Dr. Clydia Dart, but patient will need to get in before 4/30

## 2023-09-29 ENCOUNTER — Inpatient Hospital Stay: Payer: Medicare HMO | Attending: Hematology and Oncology

## 2023-09-29 ENCOUNTER — Inpatient Hospital Stay (HOSPITAL_BASED_OUTPATIENT_CLINIC_OR_DEPARTMENT_OTHER): Payer: Medicare HMO | Admitting: Hematology and Oncology

## 2023-09-29 ENCOUNTER — Other Ambulatory Visit: Payer: Self-pay | Admitting: Hematology and Oncology

## 2023-09-29 VITALS — BP 135/68 | HR 66 | Temp 98.0°F | Resp 13 | Wt 255.5 lb

## 2023-09-29 DIAGNOSIS — N183 Chronic kidney disease, stage 3 unspecified: Secondary | ICD-10-CM

## 2023-09-29 DIAGNOSIS — D649 Anemia, unspecified: Secondary | ICD-10-CM

## 2023-09-29 DIAGNOSIS — Z7901 Long term (current) use of anticoagulants: Secondary | ICD-10-CM | POA: Diagnosis not present

## 2023-09-29 DIAGNOSIS — I82599 Chronic embolism and thrombosis of other specified deep vein of unspecified lower extremity: Secondary | ICD-10-CM

## 2023-09-29 DIAGNOSIS — Z86718 Personal history of other venous thrombosis and embolism: Secondary | ICD-10-CM | POA: Insufficient documentation

## 2023-09-29 LAB — CMP (CANCER CENTER ONLY)
ALT: 10 U/L (ref 0–44)
AST: 11 U/L — ABNORMAL LOW (ref 15–41)
Albumin: 4.1 g/dL (ref 3.5–5.0)
Alkaline Phosphatase: 69 U/L (ref 38–126)
Anion gap: 5 (ref 5–15)
BUN: 23 mg/dL (ref 8–23)
CO2: 25 mmol/L (ref 22–32)
Calcium: 9.3 mg/dL (ref 8.9–10.3)
Chloride: 115 mmol/L — ABNORMAL HIGH (ref 98–111)
Creatinine: 2.12 mg/dL — ABNORMAL HIGH (ref 0.44–1.00)
GFR, Estimated: 26 mL/min — ABNORMAL LOW (ref >60)
Glucose, Bld: 114 mg/dL — ABNORMAL HIGH (ref 70–99)
Potassium: 4.3 mmol/L (ref 3.5–5.1)
Sodium: 145 mmol/L (ref 135–145)
Total Bilirubin: 0.4 mg/dL (ref 0.0–1.2)
Total Protein: 6.3 g/dL — ABNORMAL LOW (ref 6.5–8.1)

## 2023-09-29 LAB — CBC WITH DIFFERENTIAL (CANCER CENTER ONLY)
Abs Immature Granulocytes: 0.01 10*3/uL (ref 0.00–0.07)
Basophils Absolute: 0.1 10*3/uL (ref 0.0–0.1)
Basophils Relative: 1 %
Eosinophils Absolute: 0 10*3/uL (ref 0.0–0.5)
Eosinophils Relative: 1 %
HCT: 37.4 % (ref 36.0–46.0)
Hemoglobin: 12.7 g/dL (ref 12.0–15.0)
Immature Granulocytes: 0 %
Lymphocytes Relative: 35 %
Lymphs Abs: 2 10*3/uL (ref 0.7–4.0)
MCH: 29.4 pg (ref 26.0–34.0)
MCHC: 34 g/dL (ref 30.0–36.0)
MCV: 86.6 fL (ref 80.0–100.0)
Monocytes Absolute: 0.4 10*3/uL (ref 0.1–1.0)
Monocytes Relative: 7 %
Neutro Abs: 3.2 10*3/uL (ref 1.7–7.7)
Neutrophils Relative %: 56 %
Platelet Count: 145 10*3/uL — ABNORMAL LOW (ref 150–400)
RBC: 4.32 MIL/uL (ref 3.87–5.11)
RDW: 13.6 % (ref 11.5–15.5)
WBC Count: 5.8 10*3/uL (ref 4.0–10.5)
nRBC: 0 % (ref 0.0–0.2)

## 2023-09-29 NOTE — Progress Notes (Signed)
 [facility] [phone]   [phone]  PROGRESS NOTE  Patient Care Team: Dorena Gander, MD as PCP - General (Family Medicine) Eilleen Grates, MD as PCP - Cardiology (Cardiology)  Hematological/Oncological History # Recurrent DVT 11/18/2021: Doppler US : Nonocclusive DVT of intdeterminate age in left lower extremity involving common femoral vein. Treated with Eliquis  for 3 months. 03/09/2022: Doppler US : Acute, occlusive DVT in right lower extremity involving the gastrocnemius veins. Restarted Eliquis  therapy 09/27/2022: Establish care with [facility]  Interval History:  Dana [name] 63 y.o. female with medical history significant for recurrent DVT presents for a follow up visit. The patient's last visit was on 03/31/2023. In the interim since the last visit she has had no major changes in her health.  Dana [name] reports he has been okay overall in the interim since our last visit.  She reports that she unfortunately has had numerous falls this year.  She was following in November 2020 and with most recent fall in January.  She reports that she also had COVID in September after which time she was having difficulty with losing her balance and falling.  She has had several medications rearranged by her neurologist due to these episodes.  She notes that she does have some occasional arm bruising but no bruising anywhere else.  She does not having any trouble with overt bleeding such as nosebleeds, gum bleeding, or blood in the urine/stool.  She reports that she tolerates her Eliquis  well and is only $40 per month.  She reports that she is excited that she will be soon visiting her son who is in [employer] in [place] .  She reports that she would like to be on Wegovy and will be talking to her primary care doctor about it.  She is also working with physical therapy at home in order to improve her balance and strength.  She denies any fevers, chills, sweats,  nausea, Oni or diarrhea.  A full 10 point ROS is otherwise negative.  MEDICAL HISTORY:  Past Medical History:  Diagnosis Date   Arthritis    oa   Bipolar disorder (HCC)    Chronic foot pain    Complication of anesthesia    COPD (chronic obstructive pulmonary disease) (HCC)    DVT (deep venous thrombosis) (HCC)    Involving bilateral lower extremities   GERD (gastroesophageal reflux disease)    Headache    migraines   Hyperlipidemia    Hypertension    Neuropathy    Palpitations last 1 month ago   PONV (postoperative nausea and vomiting)    Sleep apnea     SURGICAL HISTORY: Past Surgical History:  Procedure Laterality Date   ABDOMINAL HYSTERECTOMY  1998   complete   CESAREAN SECTION  1991   CHOLECYSTECTOMY  2005   FOOT SURGERY     left hip muscle tear surgery  2008   shoulder scope Left 2015   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   Procedure: LEFT TOTAL HIP ARTHROPLASTY ANTERIOR APPROACH;  Surgeon: Liliane Rei, MD;  Location: [facility];  Service: Orthopedics;  Laterality: Left;   TUBAL LIGATION  1992    SOCIAL HISTORY: Social History   Socioeconomic History   Marital status: Widowed    Spouse name: Not on file   Number of children: 2   Years of education: Not on file   Highest education level: High school graduate  Occupational History    Comment: disabled  Tobacco Use   Smoking status:  Former    Current packs/day: 0.00    Average packs/day: 1 pack/day for 34.0 years (34.0 ttl pk-yrs)    Types: Cigarettes, E-cigarettes    Start date: 06/06/1977    Quit date: 06/07/2011    Years since quitting: 12.3   Smokeless tobacco: Never  Vaping Use   Vaping status: Every Day   Start date: 02/04/2022   Substances: Nicotine, Flavoring  Substance and Sexual Activity   Alcohol use: No   Drug use: No   Sexual activity: Not on file  Other Topics Concern   Not on file  Social History Narrative   Lives alone   Social Drivers of Health   Financial Resource Strain: Low  Risk  (09/29/2021)   Received from [facility], [facility]   Overall Financial Resource Strain (CARDIA)    Difficulty of Paying Living Expenses: Not hard at all  Food Insecurity: No Food Insecurity (06/18/2023)   Hunger Vital Sign    Worried About Running Out of Food in the Last Year: Never true    Ran Out of Food in the Last Year: Never true  Transportation Needs: No Transportation Needs (06/18/2023)   PRAPARE - Administrator, Civil Service (Medical): No    Lack of Transportation (Non-Medical): No  Physical Activity: Inactive (09/29/2021)   Received from [facility], [facility]   Exercise Vital Sign    Days of Exercise per Week: 0 days    Minutes of Exercise per Session: 0 min  Stress: No Stress Concern Present (09/29/2021)   Received from [facility], [facility] - Occupational Stress Questionnaire    Feeling of Stress : Not at all  Social Connections: Unknown (10/05/2022)   Received from [facility], [facility]   Social Network    Social Network: Not on file  Intimate Partner Violence: Not At Risk (06/18/2023)   Humiliation, Afraid, Rape, and Kick questionnaire    Fear of Current or Ex-Partner: No    Emotionally Abused: No    Physically Abused: No    Sexually Abused: No    FAMILY HISTORY: Family History  Problem Relation Age of Onset   Mood Disorder Mother    Colon cancer Mother    Pulmonary fibrosis Father    Idiopathic pulmonary fibrosis Father        worked in a mill/asbestos exp   Pulmonary fibrosis Sister    Bipolar disorder Brother    Bipolar disorder Son     ALLERGIES:  is allergic to anesthetics, amide; other; oxybutynin; hyoscyamine; and codeine.  MEDICATIONS:  Current Outpatient Medications  Medication Sig Dispense Refill   Acetaminophen  (TYLENOL ) 325 MG CAPS 3 capsules     ALPRAZolam  (XANAX ) 0.5 MG tablet Take 1 tablet (0.5 mg total) by mouth 2 (two) times daily as needed for anxiety.  60 tablet 5   amitriptyline  (ELAVIL ) 25 MG tablet Take 25 mg by mouth at bedtime.     cyanocobalamin  1000 MCG tablet Take 1 tablet (1,000 mcg total) by mouth daily. 30 tablet 2   cyclobenzaprine  (FLEXERIL ) 10 MG tablet Take 10 mg by mouth 3 (three) times daily as needed for muscle spasms. (Patient not taking: Reported on 09/14/2023)     divalproex  (DEPAKOTE  ER) 250 MG 24 hr tablet Take 1 tablet (250 mg total) by mouth daily. Take with a 500 mg tablet to equal total dose of 750 mg 90 tablet 1   divalproex  (DEPAKOTE  ER) 500 MG 24 hr tablet  Take 1 tablet (500 mg total) by mouth at bedtime. Take with a 250 mg tablet to equal total dose of 750 mg 90 tablet 1   ELIQUIS  2.5 MG TABS tablet TAKE 1 TABLET(2.5 MG) BY MOUTH TWICE DAILY 60 tablet 6   esomeprazole (NEXIUM) 40 MG capsule Take 40 mg by mouth 2 (two) times daily before a meal.     fluticasone (FLONASE) 50 MCG/ACT nasal spray Place 1 spray into the nose daily as needed for allergies or rhinitis.     folic acid  (FOLVITE ) 1 MG tablet Take 1 tablet (1 mg total) by mouth daily. 30 tablet 2   ipratropium (ATROVENT) 0.06 % nasal spray Place 2 sprays into both nostrils 4 (four) times daily as needed for rhinitis. (Patient not taking: Reported on 09/14/2023)     metoprolol  tartrate (LOPRESSOR ) 25 MG tablet TAKE 1 TABLET(25 MG) BY MOUTH TWICE DAILY 180 tablet 1   ondansetron  (ZOFRAN ) 4 MG tablet as needed.     oxyCODONE  (OXY IR/ROXICODONE ) 5 MG immediate release tablet Take 5 mg by mouth in the morning and at bedtime. (Patient not taking: Reported on 09/14/2023)     pregabalin  (LYRICA ) 75 MG capsule Take 1 capsule (75 mg total) by mouth 3 (three) times daily. (Patient not taking: Reported on 09/14/2023) 90 capsule 0   rizatriptan (MAXALT) 10 MG tablet Take by mouth.     rosuvastatin  (CRESTOR ) 40 MG tablet Take 1 tablet (40 mg total) by mouth daily. 90 tablet 3   tamsulosin (FLOMAX) 0.4 MG CAPS capsule Take 0.4 mg by mouth daily.     No current  facility-administered medications for this visit.    REVIEW OF SYSTEMS:   Constitutional: ( - ) fevers, ( - )  chills , ( - ) night sweats Eyes: ( - ) blurriness of vision, ( - ) double vision, ( - ) watery eyes Ears, nose, mouth, throat, and face: ( - ) mucositis, ( - ) sore throat Respiratory: ( - ) cough, ( - ) dyspnea, ( - ) wheezes Cardiovascular: ( - ) palpitation, ( - ) chest discomfort, ( - ) lower extremity swelling Gastrointestinal:  ( - ) nausea, ( - ) heartburn, ( - ) change in bowel habits Skin: ( - ) abnormal skin rashes Lymphatics: ( - ) new lymphadenopathy, ( - ) easy bruising Neurological: ( - ) numbness, ( - ) tingling, ( - ) new weaknesses Behavioral/Psych: ( - ) mood change, ( - ) new changes  All other systems were reviewed with the patient and are negative.  PHYSICAL EXAMINATION:  There were no vitals filed for this visit.  There were no vitals filed for this visit.   GENERAL: Well-appearing middle-aged female, alert, no distress and comfortable SKIN: skin color, texture, turgor are normal, no rashes or significant lesions EYES: conjunctiva are pink and non-injected, sclera clear LUNGS: clear to auscultation and percussion with normal breathing effort HEART: regular rate & rhythm and no murmurs and no lower extremity edema Musculoskeletal: no cyanosis of digits and no clubbing  PSYCH: alert & oriented x 3, fluent speech NEURO: no focal motor/sensory deficits  LABORATORY DATA:  I have reviewed the data as listed    Latest Ref Rng & Units 06/21/2023    4:15 AM 06/19/2023    9:20 AM 06/18/2023    4:36 AM  CBC  WBC 4.0 - 10.5 K/uL 6.2  5.6  6.1   Hemoglobin 12.0 - 15.0 g/dL 16.1  09.6  11.9  Hematocrit 36.0 - 46.0 % 37.6  38.3  36.0   Platelets 150 - 400 K/uL 220  200  182        Latest Ref Rng & Units 06/21/2023    4:15 AM 06/19/2023    9:20 AM 06/18/2023    4:36 AM  CMP  Glucose 70 - 99 mg/dL 161  096  045   BUN 6 - 20 mg/dL 31  26  22    Creatinine  0.44 - 1.00 mg/dL 4.09  8.11  9.14   Sodium 135 - 145 mmol/L 144  143  144   Potassium 3.5 - 5.1 mmol/L 4.1  4.3  4.0   Chloride 98 - 111 mmol/L 113  113  114   CO2 22 - 32 mmol/L 22  20  21    Calcium  8.9 - 10.3 mg/dL 9.3  9.3  9.5   Total Protein 6.5 - 8.1 g/dL   6.8   Total Bilirubin 0.0 - 1.2 mg/dL   0.5   Alkaline Phos 38 - 126 U/L   67   AST 15 - 41 U/L   12   ALT 0 - 44 U/L   16    RADIOGRAPHIC STUDIES: DG Foot Complete Right Result Date: 09/14/2023 Please see detailed radiograph report in office note.  DG Foot Complete Left Result Date: 09/14/2023 Please see detailed radiograph report in office note.   ASSESSMENT & PLAN Dana [name] 63 y.o. female with medical history significant for recurrent DVT presents for a follow up visit.   We reviewed risk factors that can provoke a venous thromboembolism (VTE) including prolonged travel/immobility, surgery (particular abdominal or orthropedic), trauma,  and pregnancy/ estrogen containing birth control. After a detailed history and review of the records there is no clear provoking factor for this patient's VTE.  Patients with unprovoked VTEs have up to 25% recurrence after 5 years and 36% at 10 years, with 4% of these clots being fatal ([phone]). Therefore the formal recommendation for unprovoked VTE's is lifelong anticoagulation, as the cause may not be transient or reversible. We recommend 6 months or full strength anticoagulation with a re-evaluation after that time.  The patient's will then have a choice of maintenance dose DOAC (preferred, recommended), 81mg  ASA PO daily (non-preferred), or no further anticoagulation (not recommended).    #Unprovoked DVT --First episode occurred on 11/18/2021. Nonocclusive DVT of intdeterminate age in left lower extremity involving common femoral vein. Felt to be unprovoked. Treated with Eliquis  for 3 months. --Second episode occurred on 03/09/2022.Acute, occlusive DVT in right lower  extremity involving the gastrocnemius veins. Felt to be unprovoked. Restarted Eliquis  therapy --Findings at this time are consistent with a unprovoked VTE PLAN: --Recommend indefinite anticoagulation. Patient is currently on eliquis  maintenance dose of 2.5 mg BID.  --patient denies any bleeding, bruising, or dark stools on this medication. It is well tolerated. No difficulties accessing/affording the medication --labs today show white blood cell 5.8, hemoglobin 12.7, MCV 86.6, platelets 145.  Creatinine 2.12, LFTs within normal limits. --RTC in 6 months' time with strict return precautions for overt signs of bleeding.    No orders of the defined types were placed in this encounter.   All questions were answered. The patient knows to call the clinic with any problems, questions or concerns.  A total of more than 25 minutes were spent on this encounter with face-to-face time and non-face-to-face time, including preparing to see the patient, ordering tests and/or medications, counseling the patient and coordination of  care as outlined above.   Rogerio Clay, MD Department of Hematology/Oncology [facility] Phone: [phone] Pager: [phone] Email: [email or web address]  09/29/2023 7:35 AM

## 2023-10-03 DIAGNOSIS — H16223 Keratoconjunctivitis sicca, not specified as Sjogren's, bilateral: Secondary | ICD-10-CM | POA: Diagnosis not present

## 2023-10-03 DIAGNOSIS — R2689 Other abnormalities of gait and mobility: Secondary | ICD-10-CM | POA: Diagnosis not present

## 2023-10-04 DIAGNOSIS — I2510 Atherosclerotic heart disease of native coronary artery without angina pectoris: Secondary | ICD-10-CM | POA: Diagnosis not present

## 2023-10-04 DIAGNOSIS — I10 Essential (primary) hypertension: Secondary | ICD-10-CM | POA: Diagnosis not present

## 2023-10-04 DIAGNOSIS — N189 Chronic kidney disease, unspecified: Secondary | ICD-10-CM | POA: Diagnosis not present

## 2023-10-10 DIAGNOSIS — R2689 Other abnormalities of gait and mobility: Secondary | ICD-10-CM | POA: Diagnosis not present

## 2023-10-19 DIAGNOSIS — M25561 Pain in right knee: Secondary | ICD-10-CM | POA: Diagnosis not present

## 2023-10-26 NOTE — Progress Notes (Signed)
 Cardiology Office Note:   Date:  10/27/2023  ID:  Dana [name], DOB Apr 21, 1961, MRN [id number] PCP: Dana Gander, MD  [facility] Providers Cardiologist:  Dana Grates, MD {  History of Present Illness:   Dana [name] is a 63 y.o. female who presented for evaluation of chest discomfort and palpitations.  She had previously been seen at [facility] she wore a monitor for 3 days.  This demonstrated predominantly normal sinus rhythm.  She had a low supraventricular and ventricular ectopic beat burden.  There was very rare supraventricular tachycardia lasting only 5 beats.  She had normal heart rate variation.  An echocardiogram demonstrated well-preserved ejection fraction.  There were no valvular abnormalities. In Jan 2024 she was in the ED with palpitations and chest pain.  She had chest pain without any evidence for ischemia.  She had palpitations.  She does have coronary calcium  and aortic atherosclerosis noted on a previous CT. she presents with continued palpitations.    She wore another monitor and she had NSR with PVCS.      Since I last saw her she was in the hospital in Jan with a UTI.  Since I last saw her she has had no new cardiovascular complaints but she has had problems with falls.  She is having lots of orthopedic issues.  She has chronic knee pain and she had a knee injection.  This seemed to help.  She is seeing her neurologist, orthopedics, spine surgeon and PT.  She walks with a cane and a walker.  She has had 11 falls.  Of note she did see her hematologist who told her she could not stop her anticoagulant with her history of DVTs.  ROS: As stated in the HPI and negative for all other systems.  Studies Reviewed:    EKG:   Sinus tachycardia, rate 120s, axis within normal limits, intervals within normal limits, no acute ST-T wave changes, 04/16/2024.  Risk Assessment/Calculations:              Physical Exam:   VS:  BP 132/72   Pulse 75   Ht 5\' 6"  (1.676 m)    Wt 250 lb (113.4 kg)   SpO2 97%   BMI 40.35 kg/m    Wt Readings from Last 3 Encounters:  10/27/23 250 lb (113.4 kg)  09/29/23 255 lb 8 oz (115.9 kg)  06/21/23 258 lb 9.6 oz (117.3 kg)     GEN: Well nourished, well developed in no acute distress NECK: No JVD; No carotid bruits CARDIAC: RRR, no murmurs, rubs, gallops RESPIRATORY:  Clear to auscultation without rales, wheezing or rhonchi  ABDOMEN: Soft, non-tender, non-distended EXTREMITIES:  No edema; No deformity   ASSESSMENT AND PLAN:   Chest pain:    The patient has non anginal chest pain.  It has not changed since she had a perfusion study in 2023.   At this point no further cardiovascular testing is suggested.    Palpitations: The patient's doing well with the meds as listed.  No change in therapy.   Hypertension: At target.  No change in therapy.   CKD IIIb: Creatinine was 2.12 and she is followed by nephrology.    DVT:   This is prescribed by her hematologist and she was suggested not to come off of this despite the follow-up.  I think she is more careful with her walker and her canes now.  Plans per hematology.   Dyslipidemia: LDL was 68.  No change in therapy.  Follow up with me in 12 months.   Signed, Dana Grates, MD

## 2023-10-27 ENCOUNTER — Encounter: Payer: Self-pay | Admitting: Cardiology

## 2023-10-27 ENCOUNTER — Ambulatory Visit: Attending: Cardiology | Admitting: Cardiology

## 2023-10-27 VITALS — BP 132/72 | HR 75 | Ht 66.0 in | Wt 250.0 lb

## 2023-10-27 DIAGNOSIS — R072 Precordial pain: Secondary | ICD-10-CM

## 2023-10-27 DIAGNOSIS — R002 Palpitations: Secondary | ICD-10-CM

## 2023-10-27 DIAGNOSIS — E785 Hyperlipidemia, unspecified: Secondary | ICD-10-CM

## 2023-10-27 DIAGNOSIS — I824Y3 Acute embolism and thrombosis of unspecified deep veins of proximal lower extremity, bilateral: Secondary | ICD-10-CM | POA: Diagnosis not present

## 2023-10-27 DIAGNOSIS — I10 Essential (primary) hypertension: Secondary | ICD-10-CM

## 2023-10-27 NOTE — Patient Instructions (Signed)
 Medication Instructions:  The current medical regimen is effective;  continue present plan and medications.  *If you need a refill on your cardiac medications before your next appointment, please call your pharmacy*  Follow-Up: At [facility], you and your health needs are our priority.  As part of our continuing mission to provide you with exceptional heart care, our providers are all part of one team.  This team includes your primary Cardiologist (physician) and Advanced Practice Providers or APPs (Physician Assistants and Nurse Practitioners) who all work together to provide you with the care you need, when you need it.  Your next appointment:   1 year(s)  Provider:   Rollene Rotunda, MD    We recommend signing up for the patient portal called "MyChart".  Sign up information is provided on this After Visit Summary.  MyChart is used to connect with patients for Virtual Visits (Telemedicine).  Patients are able to view lab/test results, encounter notes, upcoming appointments, etc.  Non-urgent messages can be sent to your provider as well.   To learn more about what you can do with MyChart, go to [email or web address].

## 2023-10-31 ENCOUNTER — Telehealth: Payer: Self-pay

## 2023-10-31 NOTE — Telephone Encounter (Signed)
 Patient called and left a message - she is ready to schedule the nerve biopsy  (her son has gone back over seas)     Do we have BAKO kits that are not expired?

## 2023-11-01 DIAGNOSIS — G4733 Obstructive sleep apnea (adult) (pediatric): Secondary | ICD-10-CM | POA: Diagnosis not present

## 2023-11-03 DIAGNOSIS — N184 Chronic kidney disease, stage 4 (severe): Secondary | ICD-10-CM | POA: Diagnosis not present

## 2023-11-03 DIAGNOSIS — E669 Obesity, unspecified: Secondary | ICD-10-CM | POA: Diagnosis not present

## 2023-11-03 DIAGNOSIS — N1419 Nephropathy induced by other drugs, medicaments and biological substances: Secondary | ICD-10-CM | POA: Diagnosis not present

## 2023-11-03 DIAGNOSIS — N2581 Secondary hyperparathyroidism of renal origin: Secondary | ICD-10-CM | POA: Diagnosis not present

## 2023-11-03 DIAGNOSIS — D631 Anemia in chronic kidney disease: Secondary | ICD-10-CM | POA: Diagnosis not present

## 2023-11-03 DIAGNOSIS — T56891A Toxic effect of other metals, accidental (unintentional), initial encounter: Secondary | ICD-10-CM | POA: Diagnosis not present

## 2023-11-03 DIAGNOSIS — I10 Essential (primary) hypertension: Secondary | ICD-10-CM | POA: Diagnosis not present

## 2023-11-04 ENCOUNTER — Other Ambulatory Visit: Payer: Self-pay | Admitting: Cardiology

## 2023-11-04 DIAGNOSIS — R072 Precordial pain: Secondary | ICD-10-CM

## 2023-11-04 DIAGNOSIS — N1832 Chronic kidney disease, stage 3b: Secondary | ICD-10-CM | POA: Diagnosis not present

## 2023-11-04 DIAGNOSIS — R002 Palpitations: Secondary | ICD-10-CM

## 2023-11-04 DIAGNOSIS — J449 Chronic obstructive pulmonary disease, unspecified: Secondary | ICD-10-CM | POA: Diagnosis not present

## 2023-11-04 DIAGNOSIS — M1990 Unspecified osteoarthritis, unspecified site: Secondary | ICD-10-CM | POA: Diagnosis not present

## 2023-11-04 DIAGNOSIS — I10 Essential (primary) hypertension: Secondary | ICD-10-CM | POA: Diagnosis not present

## 2023-11-04 LAB — LAB REPORT - SCANNED: EGFR: 24

## 2023-11-06 DIAGNOSIS — J449 Chronic obstructive pulmonary disease, unspecified: Secondary | ICD-10-CM | POA: Diagnosis not present

## 2023-11-06 DIAGNOSIS — N184 Chronic kidney disease, stage 4 (severe): Secondary | ICD-10-CM | POA: Diagnosis not present

## 2023-11-06 DIAGNOSIS — N189 Chronic kidney disease, unspecified: Secondary | ICD-10-CM | POA: Diagnosis not present

## 2023-11-06 DIAGNOSIS — I10 Essential (primary) hypertension: Secondary | ICD-10-CM | POA: Diagnosis not present

## 2023-11-08 ENCOUNTER — Ambulatory Visit: Admitting: Podiatry

## 2023-11-11 ENCOUNTER — Other Ambulatory Visit: Payer: Self-pay | Admitting: Hematology and Oncology

## 2023-11-15 ENCOUNTER — Ambulatory Visit: Payer: Self-pay | Admitting: Cardiology

## 2023-11-16 DIAGNOSIS — L814 Other melanin hyperpigmentation: Secondary | ICD-10-CM | POA: Diagnosis not present

## 2023-11-16 DIAGNOSIS — D225 Melanocytic nevi of trunk: Secondary | ICD-10-CM | POA: Diagnosis not present

## 2023-11-16 DIAGNOSIS — L821 Other seborrheic keratosis: Secondary | ICD-10-CM | POA: Diagnosis not present

## 2023-11-16 DIAGNOSIS — D692 Other nonthrombocytopenic purpura: Secondary | ICD-10-CM | POA: Diagnosis not present

## 2023-11-16 DIAGNOSIS — L918 Other hypertrophic disorders of the skin: Secondary | ICD-10-CM | POA: Diagnosis not present

## 2023-11-20 DIAGNOSIS — M51362 Other intervertebral disc degeneration, lumbar region with discogenic back pain and lower extremity pain: Secondary | ICD-10-CM | POA: Diagnosis not present

## 2023-11-20 DIAGNOSIS — Z79899 Other long term (current) drug therapy: Secondary | ICD-10-CM | POA: Diagnosis not present

## 2023-11-20 DIAGNOSIS — R296 Repeated falls: Secondary | ICD-10-CM | POA: Diagnosis not present

## 2023-11-21 ENCOUNTER — Telehealth: Payer: Self-pay | Admitting: Internal Medicine

## 2023-11-21 DIAGNOSIS — H11441 Conjunctival cysts, right eye: Secondary | ICD-10-CM | POA: Diagnosis not present

## 2023-11-21 NOTE — Telephone Encounter (Signed)
 There is no mention of a PFT on the AVS for this patient.  She also has not had an office visit in a year and would need an office visit.  She can schedule an appointment to see Dr. Dione Franks.  Dr. Dione Franks stated her issue was not with her lungs and to call back if her symptoms return or worsen.  Thank you.

## 2023-11-21 NOTE — Telephone Encounter (Signed)
 PT needs a PFT before next appt per last AVS. Arlin Benes Resp states no order. Can one be put in please? Thanks.

## 2023-11-22 NOTE — Telephone Encounter (Signed)
 PFT was on 11/14/22 AVS. I will call her though to set up an appt. Thanks for your reply.

## 2023-11-29 DIAGNOSIS — R2681 Unsteadiness on feet: Secondary | ICD-10-CM | POA: Diagnosis not present

## 2023-11-29 DIAGNOSIS — M5416 Radiculopathy, lumbar region: Secondary | ICD-10-CM | POA: Diagnosis not present

## 2023-11-29 DIAGNOSIS — G894 Chronic pain syndrome: Secondary | ICD-10-CM | POA: Diagnosis not present

## 2023-12-04 ENCOUNTER — Ambulatory Visit: Admitting: Podiatry

## 2023-12-04 DIAGNOSIS — N184 Chronic kidney disease, stage 4 (severe): Secondary | ICD-10-CM | POA: Diagnosis not present

## 2023-12-04 DIAGNOSIS — M1990 Unspecified osteoarthritis, unspecified site: Secondary | ICD-10-CM | POA: Diagnosis not present

## 2023-12-04 DIAGNOSIS — I10 Essential (primary) hypertension: Secondary | ICD-10-CM | POA: Diagnosis not present

## 2023-12-04 DIAGNOSIS — J449 Chronic obstructive pulmonary disease, unspecified: Secondary | ICD-10-CM | POA: Diagnosis not present

## 2023-12-04 DIAGNOSIS — N189 Chronic kidney disease, unspecified: Secondary | ICD-10-CM | POA: Diagnosis not present

## 2023-12-04 DIAGNOSIS — N1832 Chronic kidney disease, stage 3b: Secondary | ICD-10-CM | POA: Diagnosis not present

## 2023-12-12 DIAGNOSIS — N6325 Unspecified lump in the left breast, overlapping quadrants: Secondary | ICD-10-CM | POA: Diagnosis not present

## 2023-12-21 DIAGNOSIS — M7061 Trochanteric bursitis, right hip: Secondary | ICD-10-CM | POA: Diagnosis not present

## 2023-12-22 ENCOUNTER — Other Ambulatory Visit: Payer: Self-pay | Admitting: Cardiology

## 2023-12-26 DIAGNOSIS — M79604 Pain in right leg: Secondary | ICD-10-CM | POA: Diagnosis not present

## 2023-12-29 ENCOUNTER — Ambulatory Visit (INDEPENDENT_AMBULATORY_CARE_PROVIDER_SITE_OTHER)

## 2023-12-29 ENCOUNTER — Ambulatory Visit: Admitting: Podiatry

## 2023-12-29 DIAGNOSIS — M7750 Other enthesopathy of unspecified foot: Secondary | ICD-10-CM

## 2023-12-29 DIAGNOSIS — M7751 Other enthesopathy of right foot: Secondary | ICD-10-CM

## 2023-12-29 NOTE — Progress Notes (Signed)
 Subjective: Chief Complaint  Patient presents with   Foot Pain    RM Patient is here for right foot and ankle pain. Pain started one week ago.  Pain is described as spasm.   63 year old female presents the office with above concerns.  She states that she been getting increased discomfort in the right.  She does not recall any recent injuries activities.  No recent treatment for this.  At first she thought she was having a new blood clot as how her symptoms started.  She saw her vein specialist and had a ultrasound done which is negative.  She was counseled to have a hip injection on the right side.  She does not recall any recent falls but she has a history of falls affecting her right lower extremity.  Objective: AAO x3, NAD DP/PT pulses palpable bilaterally, CRT less than 3 seconds Majority tenderness today is localized along the sinus tarsi there is localized edema present there is no erythema or warmth.  Unable to appreciate any area pinpoint tenderness.  She does get some discomfort in the course of the peroneal tendon. No pain with calf compression, swelling, warmth, erythema  Assessment: Capsulitis right ankle  Plan: -All treatment options discussed with the patient including all alternatives, risks, complications.  -X-rays obtained reviewed of the foot and ankle on the right side.  Not able to appreciate any evidence of acute fracture.  Decreased calcaneal clinician no. -Discussed steroid injection verbal consent obtained.  Cleaned the skin with Betadine, alcohol.  A mixture of 1 cc Kenalog  10, 1 cc lidocaine  plain (had before, no issues) was infiltrated into the sinus tarsi without complications.  Postinjection care discussed.  Dry well. -Recommend elevation and cam boot. -Patient encouraged to call the office with any questions, concerns, change in symptoms.   Donnice JONELLE Fees DPM

## 2024-01-02 DIAGNOSIS — N184 Chronic kidney disease, stage 4 (severe): Secondary | ICD-10-CM | POA: Diagnosis not present

## 2024-01-03 ENCOUNTER — Other Ambulatory Visit: Payer: Self-pay | Admitting: Podiatry

## 2024-01-03 ENCOUNTER — Telehealth: Payer: Self-pay | Admitting: Podiatry

## 2024-01-03 MED ORDER — METHYLPREDNISOLONE 4 MG PO TBPK: ORAL_TABLET | ORAL | 0 refills | Status: AC

## 2024-01-03 NOTE — Telephone Encounter (Signed)
 Patient decided she would like for Dr. gershon to order steroid pack. Please send prescription to [facility] [address] in [place], [place]

## 2024-01-04 DIAGNOSIS — J449 Chronic obstructive pulmonary disease, unspecified: Secondary | ICD-10-CM | POA: Diagnosis not present

## 2024-01-04 DIAGNOSIS — N2581 Secondary hyperparathyroidism of renal origin: Secondary | ICD-10-CM | POA: Diagnosis not present

## 2024-01-04 DIAGNOSIS — N189 Chronic kidney disease, unspecified: Secondary | ICD-10-CM | POA: Diagnosis not present

## 2024-01-04 DIAGNOSIS — I10 Essential (primary) hypertension: Secondary | ICD-10-CM | POA: Diagnosis not present

## 2024-01-04 DIAGNOSIS — I129 Hypertensive chronic kidney disease with stage 1 through stage 4 chronic kidney disease, or unspecified chronic kidney disease: Secondary | ICD-10-CM | POA: Diagnosis not present

## 2024-01-04 DIAGNOSIS — M549 Dorsalgia, unspecified: Secondary | ICD-10-CM | POA: Diagnosis not present

## 2024-01-04 DIAGNOSIS — T56891A Toxic effect of other metals, accidental (unintentional), initial encounter: Secondary | ICD-10-CM | POA: Diagnosis not present

## 2024-01-04 DIAGNOSIS — M1990 Unspecified osteoarthritis, unspecified site: Secondary | ICD-10-CM | POA: Diagnosis not present

## 2024-01-04 DIAGNOSIS — N1832 Chronic kidney disease, stage 3b: Secondary | ICD-10-CM | POA: Diagnosis not present

## 2024-01-04 DIAGNOSIS — N184 Chronic kidney disease, stage 4 (severe): Secondary | ICD-10-CM | POA: Diagnosis not present

## 2024-01-04 DIAGNOSIS — D631 Anemia in chronic kidney disease: Secondary | ICD-10-CM | POA: Diagnosis not present

## 2024-01-04 DIAGNOSIS — N1419 Nephropathy induced by other drugs, medicaments and biological substances: Secondary | ICD-10-CM | POA: Diagnosis not present

## 2024-01-21 IMAGING — CT CT ANGIO CHEST
2 of 6 series · 18 of 36 positions shown · IV contrast (agent unspecified)
Comparison: Chest x-ray earlier the same day

CLINICAL DATA: Suspected pulmonary embolism

EXAM:
CT ANGIOGRAPHY CHEST WITH CONTRAST
TECHNIQUE: Multidetector CT imaging of the chest was performed using the
standard protocol during bolus administration of intravenous
contrast. Multiplanar CT image reconstructions and MIPs were
obtained to evaluate the vascular anatomy.

[Series 7: pe thins · axial · 0.80mm/px · z∈[-449,-179]mm · 17 of 429 slices shown]
[im 22/429  lung]
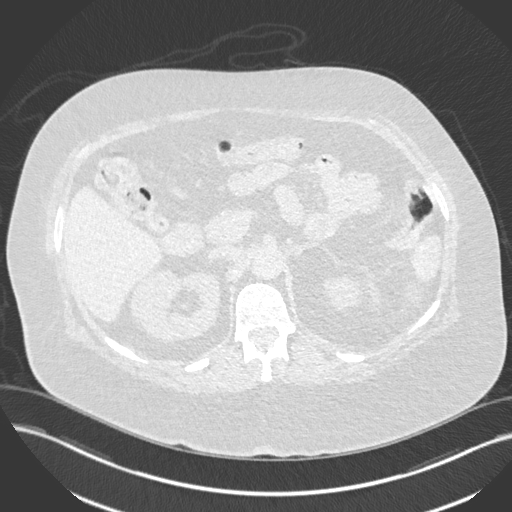
[im 43/429  mediastinal]
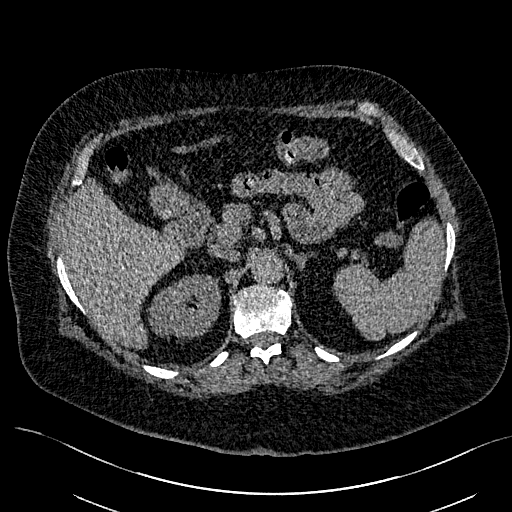
[im 65/429  lung]
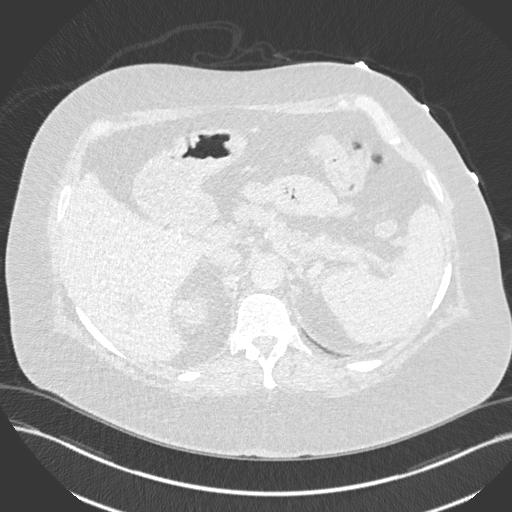
[im 86/429  mediastinal]
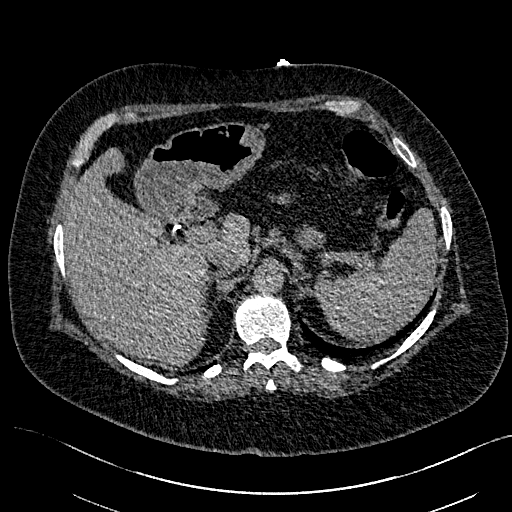
[im 129/429  lung]
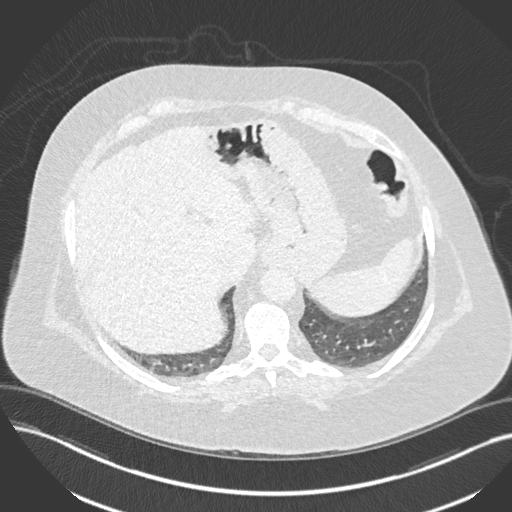
[im 150/429  mediastinal]
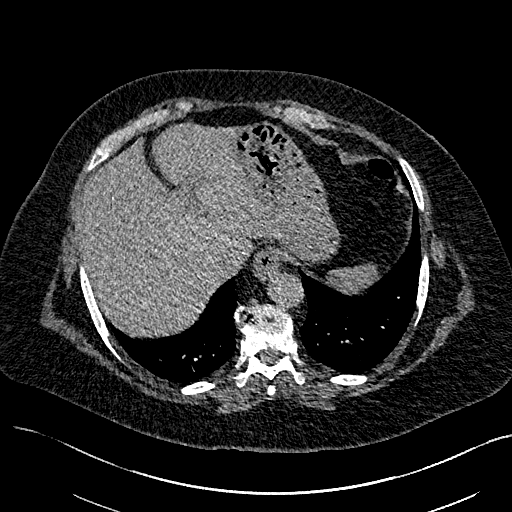
[im 172/429  lung]
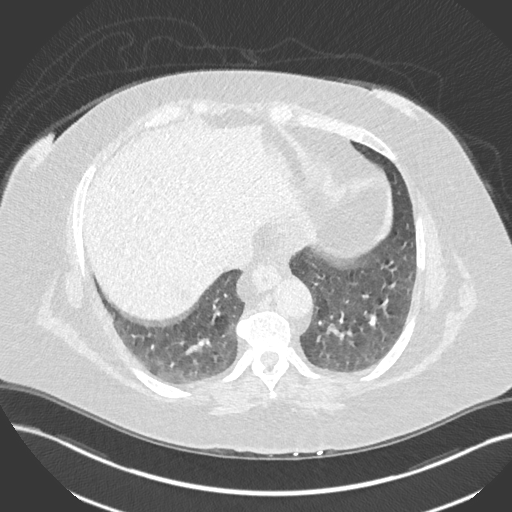
[im 193/429  mediastinal]
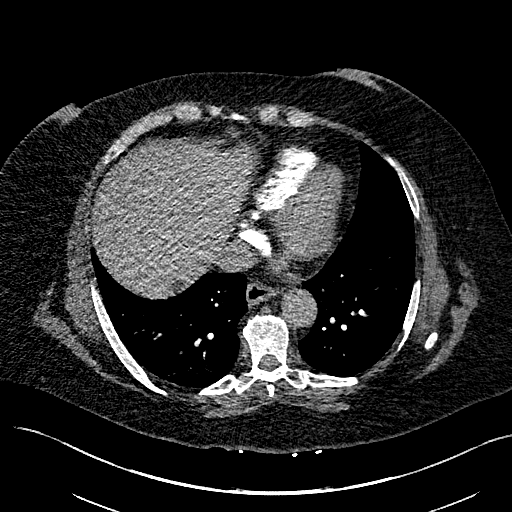
[im 215/429  lung]
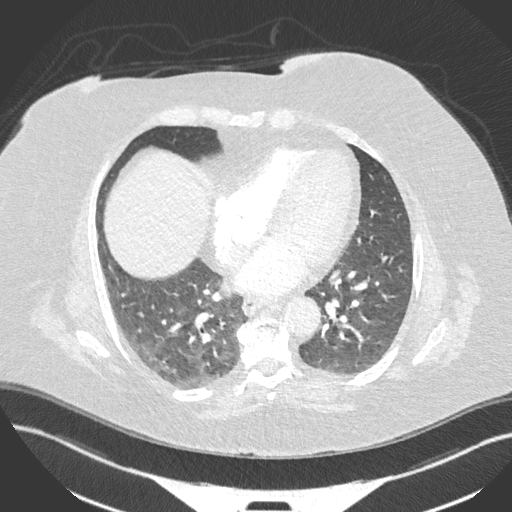
[im 236/429  mediastinal]
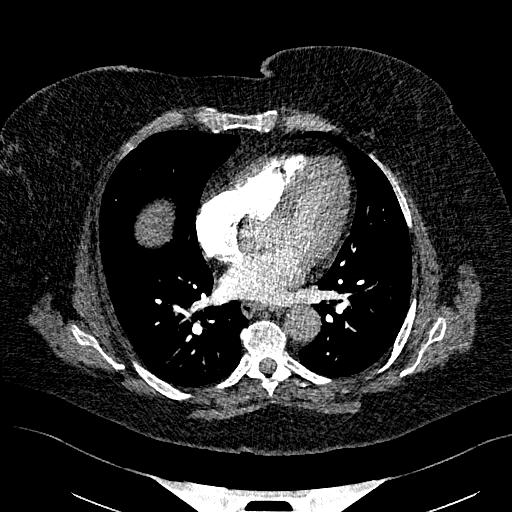
[im 257/429  lung]
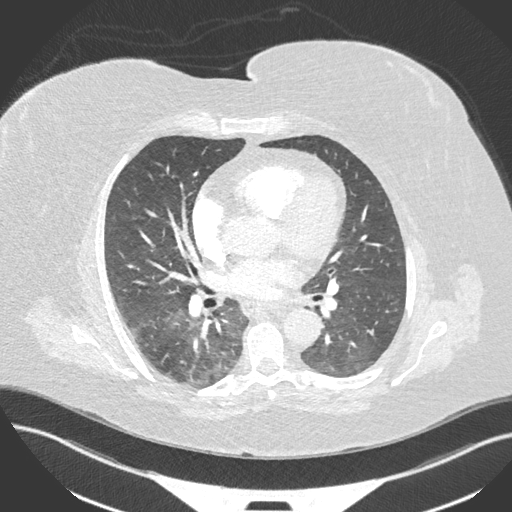
[im 279/429  mediastinal]
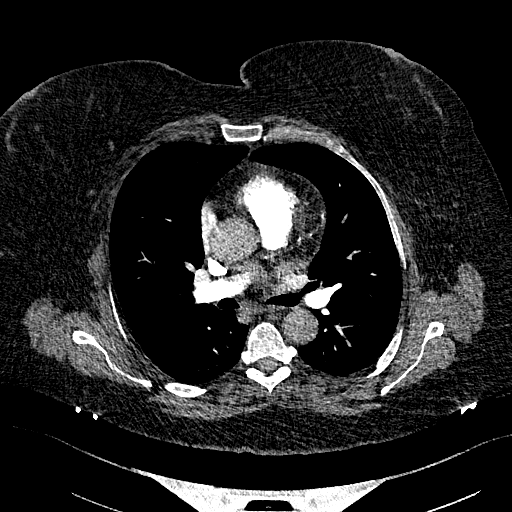
[im 300/429  lung]
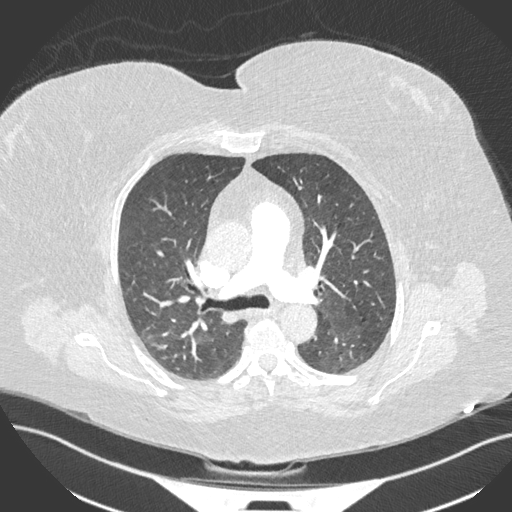
[im 343/429  mediastinal]
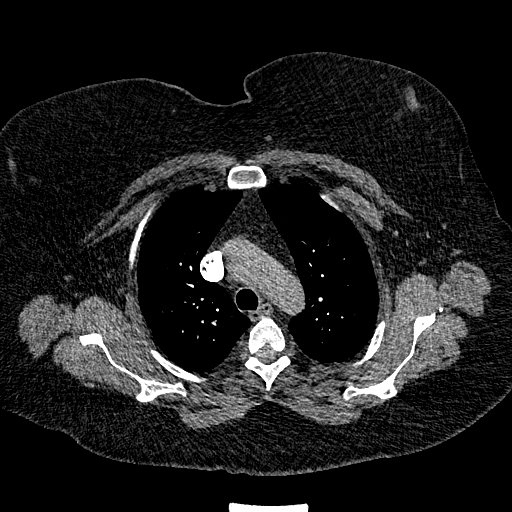
[im 364/429  lung]
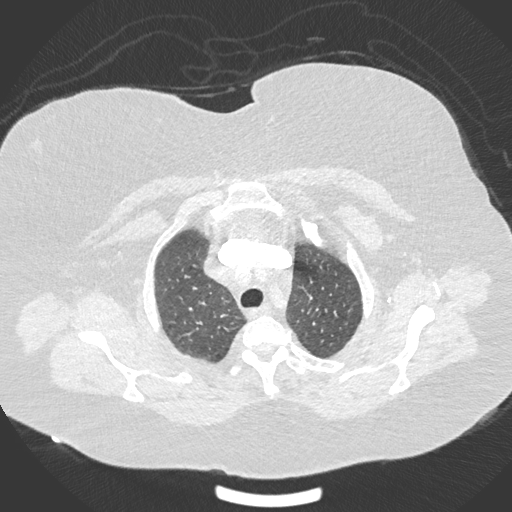
[im 386/429  mediastinal]
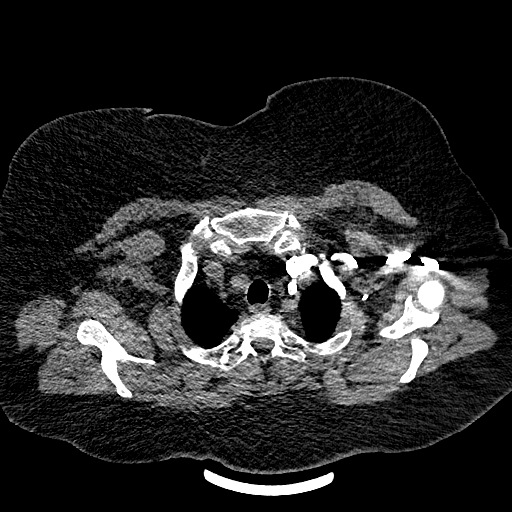
[im 407/429  lung]
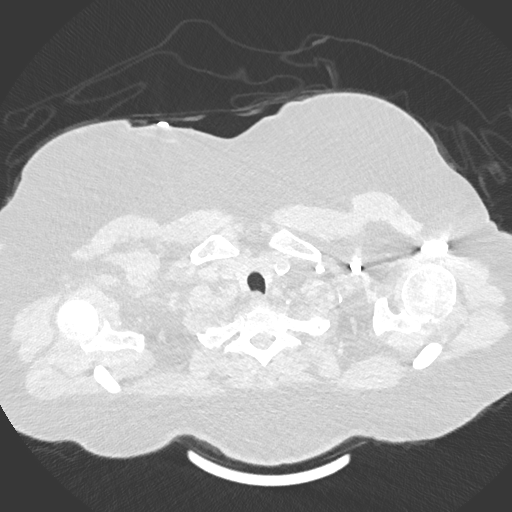

[Series 8: pe 2mm cor · coronal · 0.59mm/px · 1 of 151 slices shown]
[im 76/151  mediastinal]
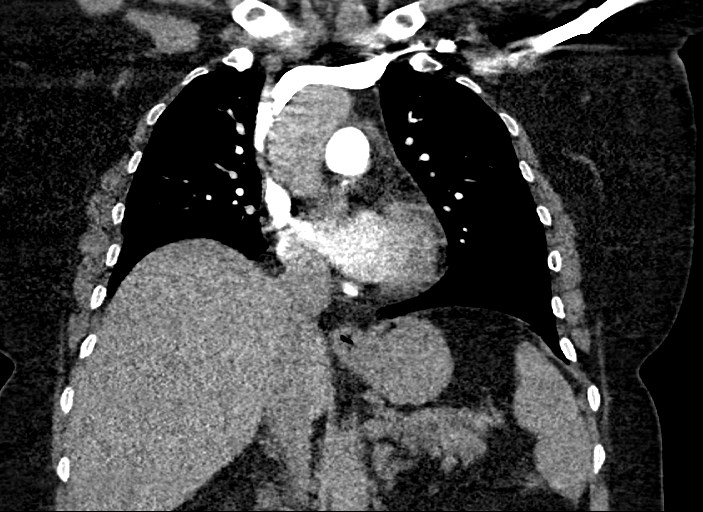

[18 of 36 positions shown; findings below may reference images not displayed]

RADIATION DOSE REDUCTION: This exam was performed according to the
departmental dose-optimization program which includes automated
exposure control, adjustment of the mA and/or kV according to
patient size and/or use of iterative reconstruction technique.

CONTRAST:  65mL OMNIPAQUE IOHEXOL 350 MG/ML SOLN
FINDINGS: Cardiovascular: No pulmonary embolism identified. Main pulmonary
artery is normal caliber. Heart size appears normal. No pericardial
effusion identified. Coronary artery calcifications noted. Thoracic
aorta is normal in course and caliber.

Mediastinum/Nodes: No bulky axillary, hilar or mediastinal
lymphadenopathy identified.

Lungs/Pleura: No focal consolidation identified in the lungs. Minor
dependent subsegmental atelectatic changes. No pleural effusion or
pneumothorax. No endobronchial lesions.

Upper Abdomen: No acute abnormality.  Small hiatal hernia.

Musculoskeletal: Degenerative changes in the spine. No suspicious
bony lesions identified.

Review of the MIP images confirms the above findings.
IMPRESSION: 1. No pulmonary embolism or acute intrathoracic process identified.
2. Coronary artery calcifications.
3. Small hiatal hernia.

## 2024-01-21 IMAGING — CT CT HEAD W/O CM
4 series · 17 of 47 positions shown, 19 images · non-contrast
Comparison: None Available.

CLINICAL DATA: Dizziness



[Series 3: head without · axial · non-contrast · 0.45mm/px · z∈[-57,+68]mm · 7 of 35 slices shown, 9 images]
[im 5/35  brain]
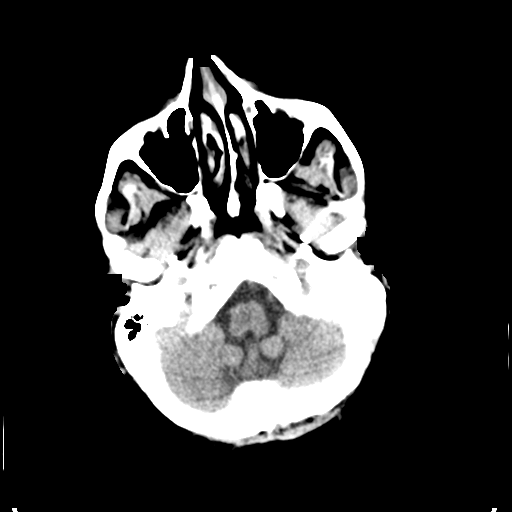
[im 5/35  bone]
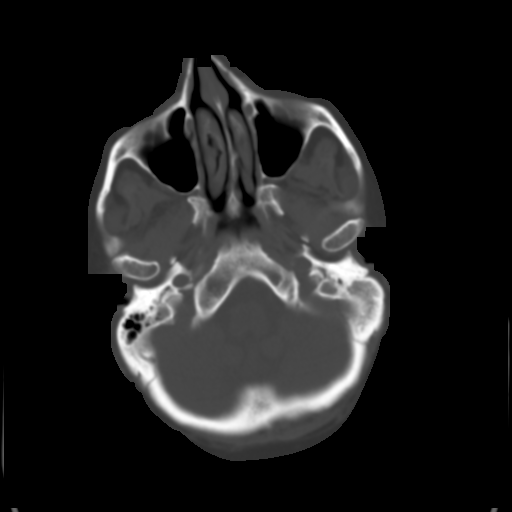
[im 9/35  brain]
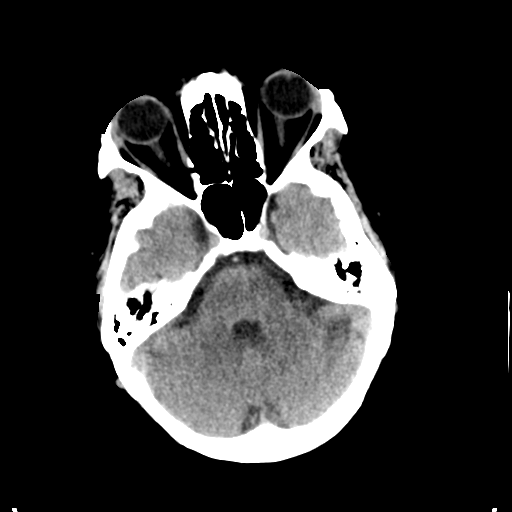
[im 13/35  brain]
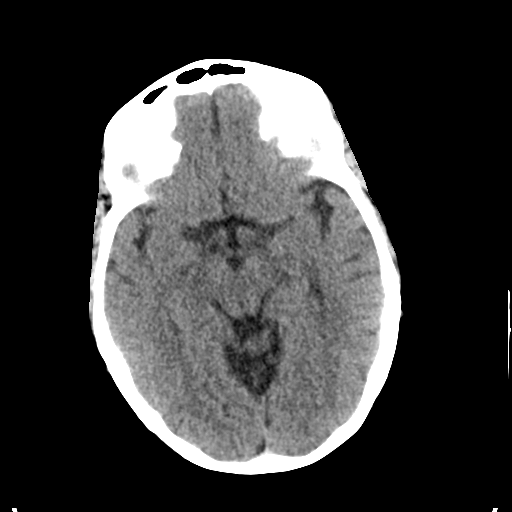
[im 18/35  brain]
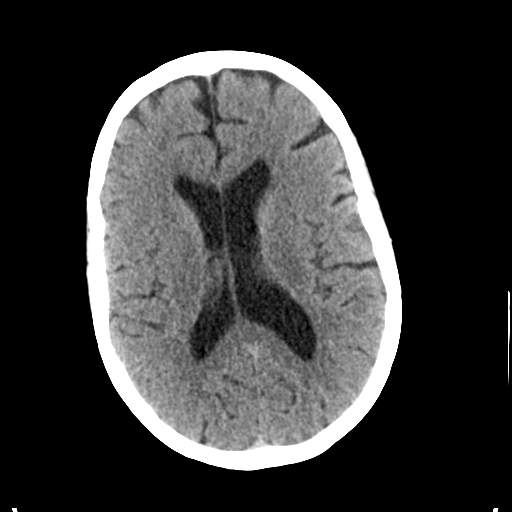
[im 22/35  brain]
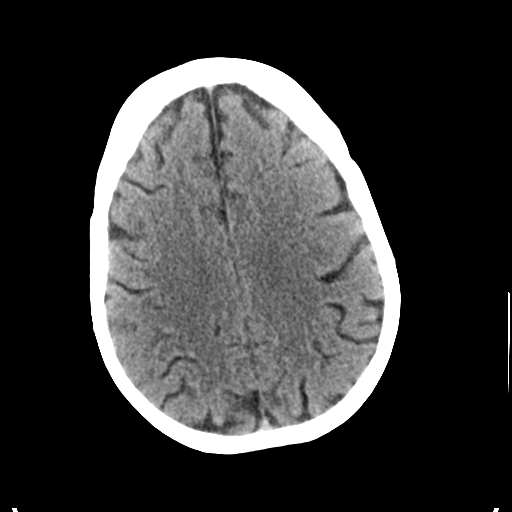
[im 22/35  bone]
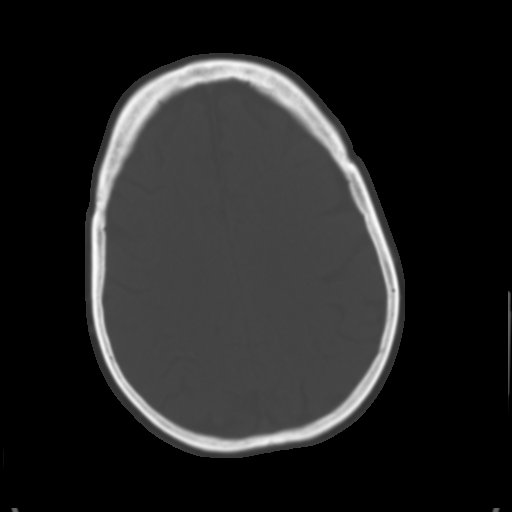
[im 26/35  brain]
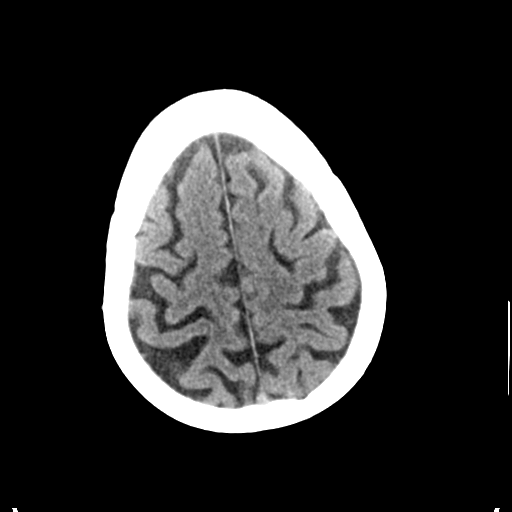
[im 30/35  brain]
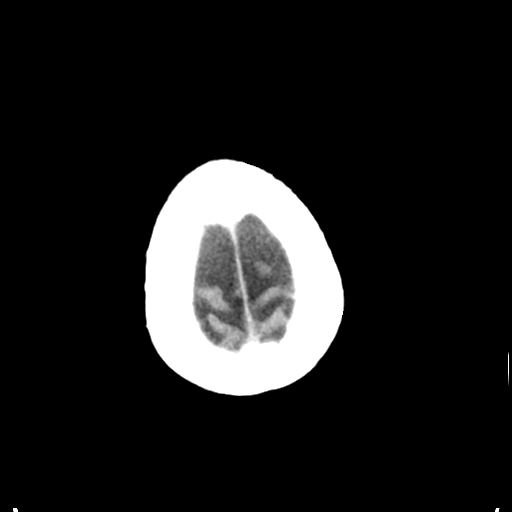

[Series 4: head bone · axial · 0.45mm/px · z∈[-61,+1]mm · 4 of 88 slices shown]
[im 9/88  bone]
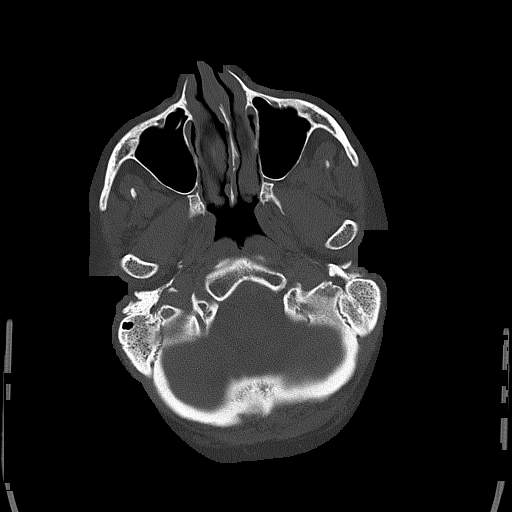
[im 18/88  bone]
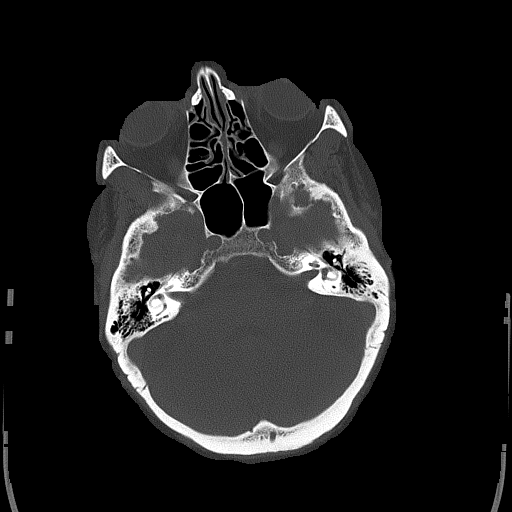
[im 27/88  bone]
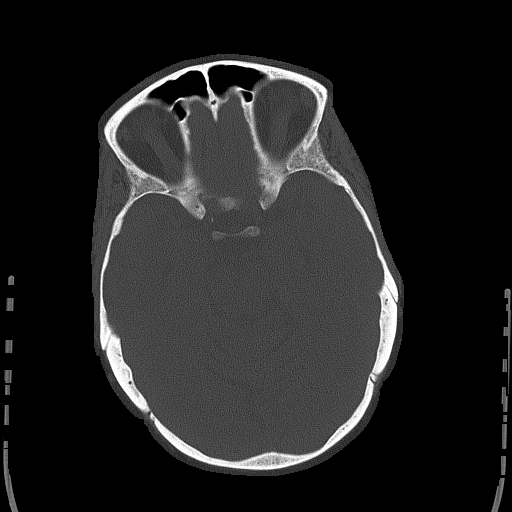
[im 40/88  bone]
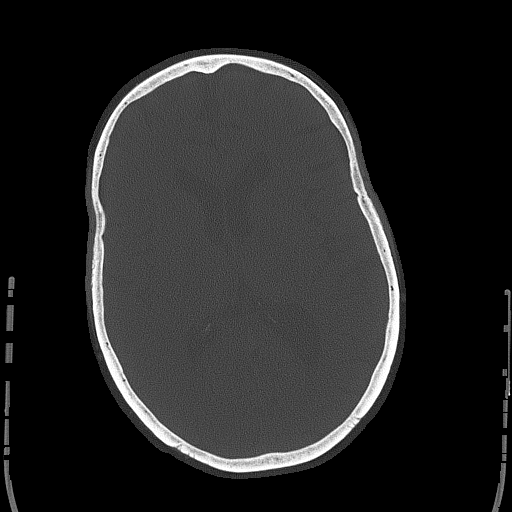

[Series 5: head without cor · coronal · non-contrast · 0.31mm/px · 3 of 71 slices shown]
[im 24/71  brain]
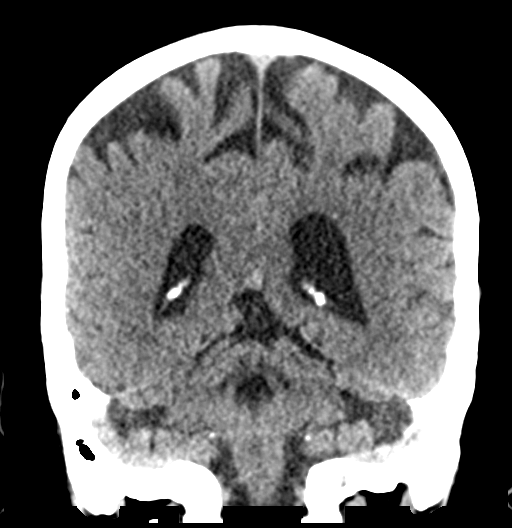
[im 32/71  brain]
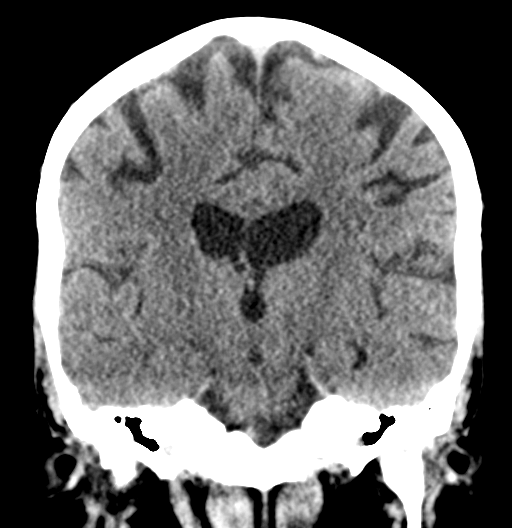
[im 39/71  brain]
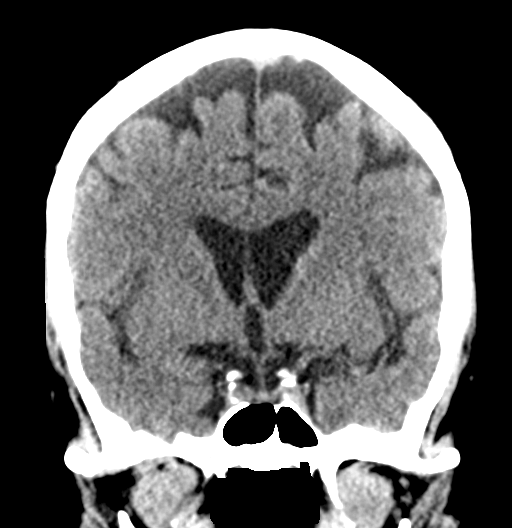

[Series 6: head without sag · sagittal · non-contrast · 0.32mm/px · 3 of 54 slices shown]
[im 18/54  brain]
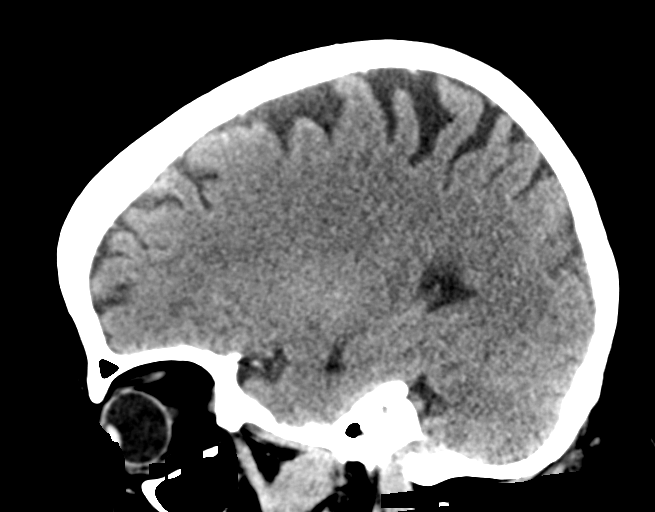
[im 27/54  brain]
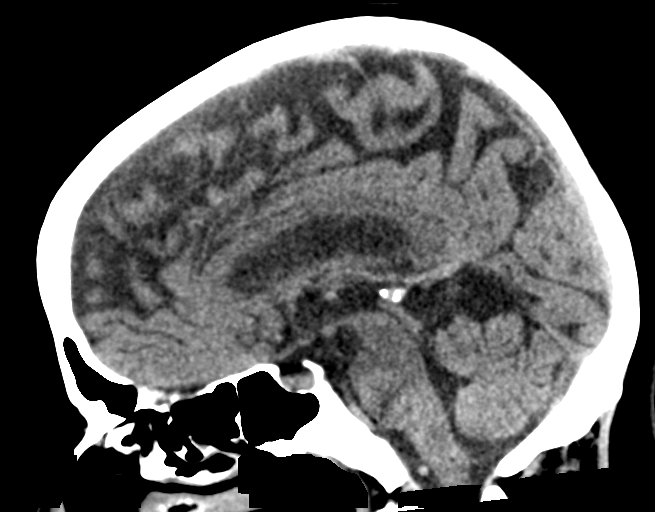
[im 36/54  brain]
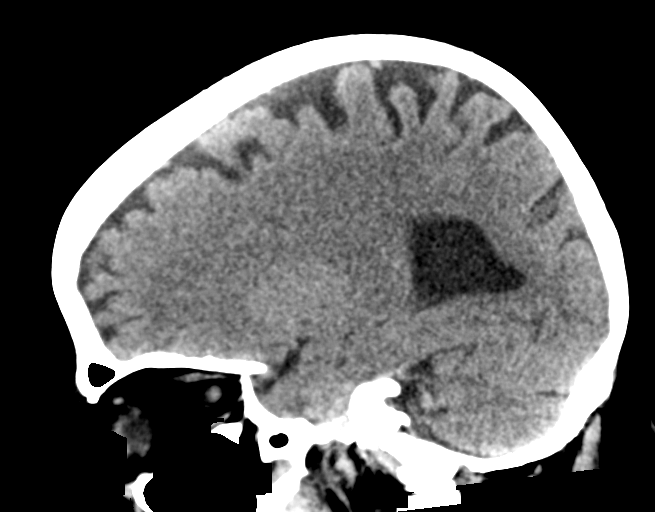

[17 of 47 positions shown; findings below may reference images not displayed]

FINDINGS: Brain: No acute infarct or hemorrhage. Lateral ventricles and
midline structures are unremarkable. No acute extra-axial fluid
collections. No mass effect.

Vascular: No hyperdense vessel or unexpected calcification.

Skull: Normal. Negative for fracture or focal lesion.

Sinuses/Orbits: No acute finding.

Other: None.
IMPRESSION: 1. No acute intracranial process.

## 2024-01-26 DIAGNOSIS — M51362 Other intervertebral disc degeneration, lumbar region with discogenic back pain and lower extremity pain: Secondary | ICD-10-CM | POA: Diagnosis not present

## 2024-01-26 DIAGNOSIS — R202 Paresthesia of skin: Secondary | ICD-10-CM | POA: Diagnosis not present

## 2024-01-29 ENCOUNTER — Ambulatory Visit: Admitting: Podiatry

## 2024-02-03 DIAGNOSIS — J449 Chronic obstructive pulmonary disease, unspecified: Secondary | ICD-10-CM | POA: Diagnosis not present

## 2024-02-03 DIAGNOSIS — N189 Chronic kidney disease, unspecified: Secondary | ICD-10-CM | POA: Diagnosis not present

## 2024-02-03 DIAGNOSIS — I10 Essential (primary) hypertension: Secondary | ICD-10-CM | POA: Diagnosis not present

## 2024-02-03 DIAGNOSIS — N184 Chronic kidney disease, stage 4 (severe): Secondary | ICD-10-CM | POA: Diagnosis not present

## 2024-02-04 DIAGNOSIS — M1990 Unspecified osteoarthritis, unspecified site: Secondary | ICD-10-CM | POA: Diagnosis not present

## 2024-02-04 DIAGNOSIS — I10 Essential (primary) hypertension: Secondary | ICD-10-CM | POA: Diagnosis not present

## 2024-02-04 DIAGNOSIS — N189 Chronic kidney disease, unspecified: Secondary | ICD-10-CM | POA: Diagnosis not present

## 2024-02-04 DIAGNOSIS — N184 Chronic kidney disease, stage 4 (severe): Secondary | ICD-10-CM | POA: Diagnosis not present

## 2024-02-04 DIAGNOSIS — N1832 Chronic kidney disease, stage 3b: Secondary | ICD-10-CM | POA: Diagnosis not present

## 2024-02-04 DIAGNOSIS — J449 Chronic obstructive pulmonary disease, unspecified: Secondary | ICD-10-CM | POA: Diagnosis not present

## 2024-02-07 DIAGNOSIS — M25559 Pain in unspecified hip: Secondary | ICD-10-CM | POA: Diagnosis not present

## 2024-02-07 DIAGNOSIS — G8929 Other chronic pain: Secondary | ICD-10-CM | POA: Diagnosis not present

## 2024-02-07 DIAGNOSIS — M461 Sacroiliitis, not elsewhere classified: Secondary | ICD-10-CM | POA: Diagnosis not present

## 2024-02-07 DIAGNOSIS — M5441 Lumbago with sciatica, right side: Secondary | ICD-10-CM | POA: Diagnosis not present

## 2024-02-12 ENCOUNTER — Ambulatory Visit: Admitting: Podiatry

## 2024-02-12 ENCOUNTER — Ambulatory Visit (INDEPENDENT_AMBULATORY_CARE_PROVIDER_SITE_OTHER)

## 2024-02-12 DIAGNOSIS — M7751 Other enthesopathy of right foot: Secondary | ICD-10-CM | POA: Diagnosis not present

## 2024-02-12 DIAGNOSIS — S92511A Displaced fracture of proximal phalanx of right lesser toe(s), initial encounter for closed fracture: Secondary | ICD-10-CM | POA: Diagnosis not present

## 2024-02-12 DIAGNOSIS — S92511D Displaced fracture of proximal phalanx of right lesser toe(s), subsequent encounter for fracture with routine healing: Secondary | ICD-10-CM

## 2024-02-12 NOTE — Progress Notes (Unsigned)
 Subjective: Chief Complaint  Patient presents with   Foot Pain    Pt stated that she is still having pain with her foot she stated that the pain is about the same      63 year old female presents the office with above concerns.  She said the injection did help a little bit but she has most pain in her foot mostly in the toe that she broke.  She does not recall any recent injury or changes to her feet.  She does typically wear sandals.  She does have a orthotics that we made previously for her but she has not been wearing them recently.  She has follow-up with her neurologist as well as her back doctor.  She was recently started physical therapy.  Objective: AAO x3, NAD DP/PT pulses palpable bilaterally, CRT less than 3 seconds On the right side there is some mild tenderness in the sinus tarsi but majority tenderness today is along the fifth toe and along the fifth metatarsal laterally.  There is no edema, erythema.  Flexor, extensor tendons appear to be intact.  There is no other areas of pinpoint tenderness. No pain with calf compression, swelling, warmth, erythema  Assessment: Capsulitis right ankle; history of right fifth toe fracture  Plan: -All treatment options discussed with the patient including all alternatives, risks, complications.  -X-rays obtained reviewed of the right foot.  Fracture noted along the fifth digit with increased callus formation.  Chronic sclerosis in the metatarsal head but unchanged compared to prior x-ray.  There is no evidence of acute fracture noted otherwise. -I do think a lot of her symptoms are coming more from gait related changes.  Discussed to wear shoe with better arch support and she has the inserts.  If needed we can add a lateral post help decrease the lateral pressure of the foot.  Advised not wearing sandals or flip-flops.  Also she is can restart physical therapy will see how this helps her foot as well.  Return for foot pain in 6-8  weeks.  Donnice JONELLE Fees DPM

## 2024-02-13 ENCOUNTER — Telehealth: Admitting: Internal Medicine

## 2024-02-13 ENCOUNTER — Encounter: Payer: Self-pay | Admitting: Internal Medicine

## 2024-02-13 ENCOUNTER — Ambulatory Visit: Admitting: Internal Medicine

## 2024-02-13 VITALS — BP 125/73 | HR 80 | Ht 66.0 in | Wt 245.0 lb

## 2024-02-13 DIAGNOSIS — F17290 Nicotine dependence, other tobacco product, uncomplicated: Secondary | ICD-10-CM

## 2024-02-13 DIAGNOSIS — R002 Palpitations: Secondary | ICD-10-CM

## 2024-02-13 DIAGNOSIS — G8929 Other chronic pain: Secondary | ICD-10-CM

## 2024-02-13 DIAGNOSIS — R079 Chest pain, unspecified: Secondary | ICD-10-CM | POA: Diagnosis not present

## 2024-02-13 DIAGNOSIS — N183 Chronic kidney disease, stage 3 unspecified: Secondary | ICD-10-CM

## 2024-02-13 DIAGNOSIS — Z7289 Other problems related to lifestyle: Secondary | ICD-10-CM

## 2024-02-13 NOTE — Addendum Note (Signed)
 Addended by: Kerrie Latour on: 02/13/2024 02:40 PM   Modules accepted: Level of Service

## 2024-02-13 NOTE — Progress Notes (Signed)
 I connected with Dana [name] on 02/13/2024 by video enabled telemedicine application and verified that I am speaking with the correct person using two identifiers. Patient is at home, Physician is in office.    I discussed the limitations of evaluation and management by telemedicine. The patient expressed understanding and agreed to proceed.        Dana [name]    [id number]    10-Feb-1961  Primary Care Physician:Hagler, Vernell, MD Date of Appointment: 02/13/2024 Established Patient Visit  Chief complaint:   Chief Complaint  Patient presents with   Medical Management of Chronic Issues    Needs a PFT ASAP chest discomfort is doing better      HPI: Dana [name] is a 62 y.o. woman with chronic non-anginal chest pain.   Interval Updates: Here for a follow up visit. She is wondering if she needs pulmonary function testing and was told she needs to have pft.   She denies any dyspnea. No interval pneumonia or bronchitis. Still vaping. One cartridge lasts her two weeks.   She is having problems with balance and falls as well as chronic kidney disease.   She feels balance issues started when she had covid infection. Is referred for physical therapy for this.   Says BP has been ok when she is feeling dizzy.   I have reviewed the patient's family social and past medical history and updated as appropriate.   Past Medical History:  Diagnosis Date   Arthritis    oa   Bipolar disorder (HCC)    Chronic foot pain    Complication of anesthesia    COPD (chronic obstructive pulmonary disease) (HCC)    DVT (deep venous thrombosis) (HCC)    Involving bilateral lower extremities   GERD (gastroesophageal reflux disease)    Headache    migraines   Hyperlipidemia    Hypertension    Neuropathy    Palpitations last 1 month ago   PONV (postoperative nausea and vomiting)    Sleep apnea     Past Surgical History:  Procedure Laterality Date   ABDOMINAL HYSTERECTOMY  1998    complete   CESAREAN SECTION  1991   CHOLECYSTECTOMY  2005   FOOT SURGERY     left hip muscle tear surgery  2008   shoulder scope Left 2015   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   Procedure: LEFT TOTAL HIP ARTHROPLASTY ANTERIOR APPROACH;  Surgeon: Dempsey Moan, MD;  Location: [facility];  Service: Orthopedics;  Laterality: Left;   TUBAL LIGATION  1992    Family History  Problem Relation Age of Onset   Mood Disorder Mother    Colon cancer Mother    Pulmonary fibrosis Father    Idiopathic pulmonary fibrosis Father        worked in a mill/asbestos exp   Pulmonary fibrosis Sister    Bipolar disorder Brother    Bipolar disorder Son     Social History   Occupational History    Comment: disabled  Tobacco Use   Smoking status: Former    Current packs/day: 0.00    Average packs/day: 1 pack/day for 34.0 years (34.0 ttl pk-yrs)    Types: Cigarettes, E-cigarettes    Start date: 06/06/1977    Quit date: 06/07/2011    Years since quitting: 12.6   Smokeless tobacco: Never  Vaping Use   Vaping status: Every Day   Start date: 02/04/2022   Substances: Nicotine, Flavoring  Substance and Sexual Activity   Alcohol use:  No   Drug use: No   Sexual activity: Not on file     Physical Exam: Blood pressure 125/73, pulse 80, height 5' 6 (1.676 m), weight 245 lb (111.1 kg). Per patient.   Gen:      No distress ENT:  mmm, no thrush Lungs:    breathing non labored no audible wheeze   Data Reviewed: Imaging:   PFTs:   Labs: Lab Results  Component Value Date   NA 145 09/29/2023   K 4.3 09/29/2023   CO2 25 09/29/2023   GLUCOSE 114 (H) 09/29/2023   BUN 23 09/29/2023   CREATININE 2.12 (H) 09/29/2023   CALCIUM  9.3 09/29/2023   GFR 33.20 (L) 12/16/2019   EGFR 24.0 11/04/2023   GFRNONAA 26 (L) 09/29/2023   Lab Results  Component Value Date   WBC 5.8 09/29/2023   HGB 12.7 09/29/2023   HCT 37.4 09/29/2023   MCV 86.6 09/29/2023   PLT 145 (L) 09/29/2023    Immunization  status: Immunization History  Administered Date(s) Administered   Fluzone Influenza virus vaccine,trivalent (IIV3), split virus 03/16/2022   Influenza Inj Mdck Quad Pf 02/21/2020, 03/08/2021   Influenza Split 03/10/2014, 03/08/2018   Influenza Whole 03/08/2018   Influenza,inj,Quad PF,6+ Mos 03/08/2018, 03/07/2019, 03/22/2019   Influenza,inj,quad, With Preservative 03/07/2019   Influenza,trivalent, recombinat, inj, PF 03/10/2014   Moderna Sars-Covid-2 Vaccination 09/05/2019, 10/01/2019   PFIZER(Purple Top)SARS-COV-2 Vaccination 09/05/2019, 10/03/2019, 10/24/2019   Tdap 05/03/2022    External Records Personally Reviewed: cardiology, pcp  Assessment:  Chronic non anginal chest pain Palpitations, controlled Vaping dependence  Plan/Recommendations: No urgent need for pfts given no respiratory symptoms.  Happy to see her again or order pfts if change in condition Recommended discontinuation of vaping.  Agree with PT follow up for falls.     Return to Care: Return if symptoms worsen or fail to improve.   Verdon Gore, MD Pulmonary and Critical Care Medicine [facility] Office:[phone]

## 2024-02-20 DIAGNOSIS — M5441 Lumbago with sciatica, right side: Secondary | ICD-10-CM | POA: Diagnosis not present

## 2024-02-20 DIAGNOSIS — G8929 Other chronic pain: Secondary | ICD-10-CM | POA: Diagnosis not present

## 2024-02-22 DIAGNOSIS — G8929 Other chronic pain: Secondary | ICD-10-CM | POA: Diagnosis not present

## 2024-02-22 DIAGNOSIS — M5441 Lumbago with sciatica, right side: Secondary | ICD-10-CM | POA: Diagnosis not present

## 2024-02-22 NOTE — Progress Notes (Signed)
 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 No bad pain today.   Change in Status Since Last Visit: No Pain:  Pain Assessment Pain Assessment: No/denies pain  OBJECTIVE  General Observation/Objective Measures: - Patient arrives in NAD              Interventions:  Therapeutic Exercise: 40' - Recumbent Bike level 3 for 5 min  - 25 inch STS unsupported 3 x 10  - Seated bilateral hip abduction 3 x 10 green band  - Seated LAQ with 5# aw 3 x 10 each R/L  *all of the following interventions were were done on mat with head of bed elevated 30 degrees* - Straight leg bridges with legs elevated on large blue ball 3 x 8  - 6 inch straight leg. Leg lifts for 5 second holds with core bracing - Hooklying 5 sec isometric ball squeezes with 5 sec exhale 10 rounds  - Education on form and performance of interventions within today's visit - Patient requires increased time spent on rest breaks   Education: Yes, as described in interventions  ASSESSMENT Patient responded well to today treatment session with occasional complaints of pain. Today's treatment session had primary focus on BLE hip strengthening and with increased time spent on core strengthening interventions. For future sessions mat work for core strengthening needs to be performed with head elevated in order to prevent patient experiencing vertigo symptoms. Patient remains a good candidate for skilled physical therapy in order to address her impairments and maximize her level of  functioning.  Therapy Diagnosis:     ICD-10-CM   1. Chronic bilateral low back pain with right-sided sciatica  G89.29, M54.41    Progress Towards Goals:   Goals Addressed             This Visit's Progress   . PT Goal       Short Term Goals (STG) - Time Frame visits 5 visits                STG 1: Patient will be independent with initial HEP in order to modulate symptoms and improve function   Baseline: Newly established    STG 2: Patient will report no more than 2/10 back pain with performance of more strenous higher level activities   Baseline: up to 7/10 pain    Long Term Goals (LTG) - Time Frame 10 visits                           LTG 1: Patient will be fully independent with advanced HEP in order to maximize function and reduce symptom occurrence   Baseline: Not yet established                LTG 2: Patient will be ambulatory without the use of straight cane in order to improve patient overall functional mobility   Baseline: Ambulatory with single point cane               LTG 3: Patient will demonstrate imroved Lumbar  AROM WFL in order to improve patient performance of ADL's and self care task   Baseline:   Lumbar AROM Flexion   50% limited   Extension   50% limited   LEFT lateral flexion    25% limited   RIGHT lateral flexion  25% limited   LEFT rotation   25% limited   RIGHT rotation   25% limited                LTG 4: Patient will have an improved score on the LEFS by 25 point indicating improved performance of LE functional mobility task   Baseline: 9/80        PLAN Treatment Frequency and Duration:  Treatment Plan Details: 1 x a week for up to 10 visits  Recommended PT Treatment/Interventions: ADL skills [phone]); Electrical stimulation-attended (02967); Neuromuscular re-education [phone]); Self-care/home management [phone]); Manual therapy (97140); Therapeutic activity (97530); Electrical stimulation-unattended (02985); Therapeutic exercise (97110); Dry  needling (1-2 muscles) (79439); Ultrasound (02964)  Recommended Consults:  None currently  Development of Plan of Care:  Patient participated in plan of care development.  Total Treatment Time (Time & Untimed): Total Treatment Time: 40 Total Time in Timed Codes: Time in Timed Codes: 40     Treatment/Procedures Therapeutic Exercises minutes: 40             The patient has been instructed to contact our clinic if any questions or problems should arise.

## 2024-02-26 DIAGNOSIS — M461 Sacroiliitis, not elsewhere classified: Secondary | ICD-10-CM | POA: Diagnosis not present

## 2024-03-04 ENCOUNTER — Ambulatory Visit: Admitting: Podiatry

## 2024-03-04 DIAGNOSIS — M5441 Lumbago with sciatica, right side: Secondary | ICD-10-CM | POA: Diagnosis not present

## 2024-03-04 DIAGNOSIS — G8929 Other chronic pain: Secondary | ICD-10-CM | POA: Diagnosis not present

## 2024-03-04 DIAGNOSIS — J449 Chronic obstructive pulmonary disease, unspecified: Secondary | ICD-10-CM | POA: Diagnosis not present

## 2024-03-04 DIAGNOSIS — N184 Chronic kidney disease, stage 4 (severe): Secondary | ICD-10-CM | POA: Diagnosis not present

## 2024-03-04 DIAGNOSIS — I10 Essential (primary) hypertension: Secondary | ICD-10-CM | POA: Diagnosis not present

## 2024-03-04 DIAGNOSIS — N189 Chronic kidney disease, unspecified: Secondary | ICD-10-CM | POA: Diagnosis not present

## 2024-03-04 NOTE — Progress Notes (Signed)
 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   Change in Status Since Last Visit: No Pain:  Pain Assessment Pain Assessment: No/denies pain  OBJECTIVE  General Observation/Objective Measures: - Patient arrives in NAD              Interventions:  Therapeutic Exercise: 40' - Recumbent Bike level 3 for 3 min  - Standing march 2# aw 2 x 10 each R/L  - LAQ 2# aw 2 x `10 each R/L  - Supine Bridge 2 x 10  - Hooklying purple banded clamshell 3 x 10 - Supine Single Leg lift 5 sec holds with core brace  - Education on form and performance of today's treatment interventions  - Patient requires increased time spent on rest breaks   Education: Yes, as described in interventions  ASSESSMENT Patient responded well to today treatment session with occasional complaints of pain. Continued treatment emphasis on LE and Core strengthening for today's interventions. Patient had increased difficulty with performance of in place marches requiring longer extended breaks between sets. Patient able to progress in resistance for purple banded clamshell. Patient remains a good candidate for skilled physical therapy in order to address her impairments and maximize her level of functioning.  Therapy Diagnosis:     ICD-10-CM   1. Chronic bilateral low back pain with right-sided sciatica  G89.29, M54.41    Progress Towards Goals:   Goals Addressed             This Visit's Progress   . PT Goal       Short Term Goals (STG) - Time Frame  visits 5 visits                STG 1: Patient will be independent with initial HEP in order to modulate symptoms and improve function   Baseline: Newly established    STG 2: Patient will report no more than 2/10 back pain with performance of more strenous higher level activities   Baseline: up to 7/10 pain    Long Term Goals (LTG) - Time Frame 10 visits                           LTG 1: Patient will be fully independent with advanced HEP in order to maximize function and reduce symptom occurrence   Baseline: Not yet established                LTG 2: Patient will be ambulatory without the use of straight cane in order to improve patient overall functional mobility   Baseline: Ambulatory with single point cane               LTG 3: Patient will demonstrate imroved Lumbar AROM WFL in order to improve patient performance of ADL's and self care task   Baseline:   Lumbar AROM Flexion   50% limited   Extension   50% limited   LEFT lateral flexion    25% limited   RIGHT lateral flexion  25% limited   LEFT  rotation   25% limited   RIGHT rotation   25% limited                LTG 4: Patient will have an improved score on the LEFS by 25 point indicating improved performance of LE functional mobility task   Baseline: 9/80        PLAN Treatment Frequency and Duration:  Treatment Plan Details: 1 x a week for up to 10 visits  Recommended PT Treatment/Interventions: ADL skills [phone]); Electrical stimulation-attended (02967); Neuromuscular re-education [phone]); Self-care/home management [phone]); Manual therapy (97140); Therapeutic activity (97530); Electrical stimulation-unattended (02985); Therapeutic exercise (97110); Dry needling (1-2 muscles) (79439); Ultrasound (02964)  Recommended Consults:  None currently  Development of Plan of Care:  Patient participated in plan of care development.  Total Treatment Time (Time & Untimed): Total Treatment Time: 40 Total Time in Timed Codes: Time in  Timed Codes: 40     Treatment/Procedures Therapeutic Exercises minutes: 40           The patient has been instructed to contact our clinic if any questions or problems should arise.

## 2024-03-05 DIAGNOSIS — J449 Chronic obstructive pulmonary disease, unspecified: Secondary | ICD-10-CM | POA: Diagnosis not present

## 2024-03-05 DIAGNOSIS — N189 Chronic kidney disease, unspecified: Secondary | ICD-10-CM | POA: Diagnosis not present

## 2024-03-05 DIAGNOSIS — N184 Chronic kidney disease, stage 4 (severe): Secondary | ICD-10-CM | POA: Diagnosis not present

## 2024-03-05 DIAGNOSIS — G4733 Obstructive sleep apnea (adult) (pediatric): Secondary | ICD-10-CM | POA: Diagnosis not present

## 2024-03-05 DIAGNOSIS — I10 Essential (primary) hypertension: Secondary | ICD-10-CM | POA: Diagnosis not present

## 2024-03-05 DIAGNOSIS — N1832 Chronic kidney disease, stage 3b: Secondary | ICD-10-CM | POA: Diagnosis not present

## 2024-03-05 DIAGNOSIS — M1990 Unspecified osteoarthritis, unspecified site: Secondary | ICD-10-CM | POA: Diagnosis not present

## 2024-03-07 ENCOUNTER — Encounter (HOSPITAL_BASED_OUTPATIENT_CLINIC_OR_DEPARTMENT_OTHER): Payer: Self-pay

## 2024-03-07 ENCOUNTER — Other Ambulatory Visit: Payer: Self-pay

## 2024-03-07 ENCOUNTER — Emergency Department (HOSPITAL_BASED_OUTPATIENT_CLINIC_OR_DEPARTMENT_OTHER)
Admission: EM | Admit: 2024-03-07 | Discharge: 2024-03-07 | Disposition: A | Discharge status: Home / Self Care | Source: Ambulatory Visit | Attending: Emergency Medicine | Admitting: Emergency Medicine

## 2024-03-07 ENCOUNTER — Emergency Department (HOSPITAL_BASED_OUTPATIENT_CLINIC_OR_DEPARTMENT_OTHER)

## 2024-03-07 DIAGNOSIS — S0990XA Unspecified injury of head, initial encounter: Secondary | ICD-10-CM | POA: Diagnosis not present

## 2024-03-07 DIAGNOSIS — N3001 Acute cystitis with hematuria: Secondary | ICD-10-CM | POA: Diagnosis not present

## 2024-03-07 DIAGNOSIS — R519 Headache, unspecified: Secondary | ICD-10-CM | POA: Diagnosis not present

## 2024-03-07 DIAGNOSIS — Z7901 Long term (current) use of anticoagulants: Secondary | ICD-10-CM | POA: Diagnosis not present

## 2024-03-07 DIAGNOSIS — R300 Dysuria: Secondary | ICD-10-CM | POA: Diagnosis present

## 2024-03-07 DIAGNOSIS — W01198A Fall on same level from slipping, tripping and stumbling with subsequent striking against other object, initial encounter: Secondary | ICD-10-CM | POA: Diagnosis not present

## 2024-03-07 LAB — CBC WITH DIFFERENTIAL/PLATELET
Abs Immature Granulocytes: 0.04 K/uL (ref 0.00–0.07)
Basophils Absolute: 0.1 K/uL (ref 0.0–0.1)
Basophils Relative: 1 %
Eosinophils Absolute: 0 K/uL (ref 0.0–0.5)
Eosinophils Relative: 0 %
HCT: 38.9 % (ref 36.0–46.0)
Hemoglobin: 13 g/dL (ref 12.0–15.0)
Immature Granulocytes: 0 %
Lymphocytes Relative: 20 %
Lymphs Abs: 2 K/uL (ref 0.7–4.0)
MCH: 30.4 pg (ref 26.0–34.0)
MCHC: 33.4 g/dL (ref 30.0–36.0)
MCV: 91.1 fL (ref 80.0–100.0)
Monocytes Absolute: 0.8 K/uL (ref 0.1–1.0)
Monocytes Relative: 8 %
Neutro Abs: 7 K/uL (ref 1.7–7.7)
Neutrophils Relative %: 71 %
Platelets: 149 K/uL — ABNORMAL LOW (ref 150–400)
RBC: 4.27 MIL/uL (ref 3.87–5.11)
RDW: 13.7 % (ref 11.5–15.5)
WBC: 9.9 K/uL (ref 4.0–10.5)
nRBC: 0 % (ref 0.0–0.2)

## 2024-03-07 LAB — URINALYSIS, W/ REFLEX TO CULTURE (INFECTION SUSPECTED)
Bilirubin Urine: NEGATIVE
Glucose, UA: NEGATIVE mg/dL
Ketones, ur: NEGATIVE mg/dL
Nitrite: NEGATIVE
Protein, ur: 30 mg/dL — AB
Specific Gravity, Urine: 1.005 (ref 1.005–1.030)
WBC, UA: >50 WBC/hpf (ref 0–5)
pH: 6 (ref 5.0–8.0)

## 2024-03-07 LAB — BASIC METABOLIC PANEL WITH GFR
Anion gap: 12 (ref 5–15)
BUN: 23 mg/dL (ref 8–23)
CO2: 19 mmol/L — ABNORMAL LOW (ref 22–32)
Calcium: 9.9 mg/dL (ref 8.9–10.3)
Chloride: 113 mmol/L — ABNORMAL HIGH (ref 98–111)
Creatinine, Ser: 2.18 mg/dL — ABNORMAL HIGH (ref 0.44–1.00)
GFR, Estimated: 25 mL/min — ABNORMAL LOW (ref >60)
Glucose, Bld: 85 mg/dL (ref 70–99)
Potassium: 4.3 mmol/L (ref 3.5–5.1)
Sodium: 144 mmol/L (ref 135–145)

## 2024-03-07 MED ORDER — ACETAMINOPHEN 500 MG PO TABS
1000.0000 mg | ORAL_TABLET | Freq: Once | ORAL | Status: AC
  Administered 2024-03-07: 1000 mg via ORAL
  Filled 2024-03-07: qty 2

## 2024-03-07 MED ORDER — ONDANSETRON 4 MG PO TBDP
4.0000 mg | ORAL_TABLET | Freq: Once | ORAL | Status: AC
Start: 2024-03-07 — End: 2024-03-07
  Administered 2024-03-07: 4 mg via ORAL
  Filled 2024-03-07: qty 1

## 2024-03-07 MED ORDER — SULFAMETHOXAZOLE-TRIMETHOPRIM 800-160 MG PO TABS
1.0000 | ORAL_TABLET | Freq: Two times a day (BID) | ORAL | 0 refills | Status: AC
Start: 2024-03-07 — End: 2024-03-14

## 2024-03-07 NOTE — ED Notes (Signed)
 Pt given discharge instructions and reviewed prescriptions. Opportunities given for questions. Pt verbalizes understanding. PIV removed x1. Bethena Powell SAUNDERS, RN

## 2024-03-07 NOTE — Discharge Instructions (Addendum)
 Head CT was reassuring today.  Your lab work was suggestive of UTI.  We have sent it off for culture and I have prescribed you Bactrim to take twice daily for 7 days.  It is advised to follow-up with primary care or your nephrologist to monitor course of UTI.  If symptoms worsen such as fever, worsening pain, shortness of breath, chest pain, dizziness, or weakness return to the ED for further evaluation.  Your creatinine was 2.18 and your GFR was 25 today.  It is advised to continue to follow-up with nephrology for management of kidney disease.  We hope you feel better.

## 2024-03-07 NOTE — ED Provider Notes (Signed)
 [place] EMERGENCY DEPARTMENT AT [facility] Provider Note   CSN: [id number] Arrival date & time: 03/07/24  1148     Patient presents with: Dana [name]   KRISTENE LIBERATI is a 63 y.o. female.  63 year old female presents to the ED with complaints of UTI-like symptoms including frequent urination and painful urination.  Patient also reports a fall on September 8 with mild achy pain in the occipital region of her head.  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  Patient denies any other blood clots since being placed on Eliquis .  Patient denies any fever but does endorse some mild associated nausea but started today.  Patient also reports some lower abdominal pain that started this morning as well.     Prior to Admission medications   Medication Sig Start Date End Date Taking? Authorizing Provider  sulfamethoxazole-trimethoprim (BACTRIM DS) 800-160 MG tablet Take 1 tablet by mouth 2 (two) times daily for 7 days. 03/07/24 03/14/24 Yes Myriam Fonda RAMAN, PA-C  Acetaminophen  (TYLENOL ) 325 MG CAPS 3 capsules    [provider]  ALPRAZolam  (XANAX ) 0.5 MG tablet Take 1 tablet (0.5 mg total) by mouth 2 (two) times daily as needed for anxiety. 01/19/22 02/13/24  Franchot Harlene SQUIBB, PMHNP  amitriptyline  (ELAVIL ) 25 MG tablet Take 25 mg by mouth at bedtime.    [provider]  divalproex  (DEPAKOTE  ER) 250 MG 24 hr tablet Take 1 tablet (250 mg total) by mouth daily. Take with a 500 mg tablet to equal total dose of 750 mg 04/24/23 02/13/24  Franchot Harlene SQUIBB, PMHNP  divalproex  (DEPAKOTE  ER) 500 MG 24 hr tablet Take 1 tablet (500 mg total) by mouth at bedtime. Take with a 250 mg tablet to equal total dose of 750 mg 04/24/23 02/13/24  Franchot Harlene SQUIBB, PMHNP  ELIQUIS  2.5 MG TABS tablet TAKE 1 TABLET(2.5 MG) BY MOUTH TWICE DAILY 11/11/23   Dorsey, John T IV, MD  esomeprazole (NEXIUM) 40 MG capsule Take 40 mg by mouth 2 (two) times daily before a meal. 09/08/19    [provider]  fluticasone (FLONASE) 50 MCG/ACT nasal spray Place 1 spray into the nose daily as needed for allergies or rhinitis. 08/09/22   [provider]  folic acid  (FOLVITE ) 1 MG tablet Take 1 tablet (1 mg total) by mouth daily. 06/22/23   Krishnan, Gokul, MD  furosemide (LASIX) 20 MG tablet PRN    [provider]  methylPREDNISolone  (MEDROL  DOSEPAK) 4 MG TBPK tablet Take as directed 01/03/24   Gershon Donnice SAUNDERS, DPM  metoprolol  tartrate (LOPRESSOR ) 25 MG tablet TAKE 1 TABLET(25 MG) BY MOUTH TWICE DAILY 11/06/23   Lavona Agent, MD  ondansetron  (ZOFRAN ) 4 MG tablet as needed. 01/30/20   [provider]  oxyCODONE  (OXY IR/ROXICODONE ) 5 MG immediate release tablet Take 5 mg by mouth in the morning and at bedtime. Patient not taking: Reported on 02/13/2024    [provider]  rizatriptan (MAXALT) 10 MG tablet Take by mouth. 01/20/21   [provider]  rosuvastatin  (CRESTOR ) 40 MG tablet TAKE 1 TABLET(40 MG) BY MOUTH DAILY 12/22/23   Lavona Agent, MD  tamsulosin (FLOMAX) 0.4 MG CAPS capsule Take 0.4 mg by mouth daily. 07/18/21   [provider]    Allergies: Anesthetics, amide; Other; Oxybutynin; Hyoscyamine; and Codeine    Review of Systems  Genitourinary:  Positive for dysuria and frequency.  All other systems reviewed and are negative.  Updated Vital Signs BP 134/82 (BP Location: Right Arm)   Pulse 73   Temp 98.9 F (37.2 C) (Oral)   Resp 17   Ht 5' 6 (1.676 m)   Wt 110.7 kg   SpO2 94%   BMI 39.38 kg/m   Physical Exam Vitals and nursing note reviewed.  Constitutional:      Appearance: Normal appearance.  HENT:     Head: Normocephalic and atraumatic.     Nose: Nose normal.  Eyes:     Extraocular Movements: Extraocular movements intact.     Conjunctiva/sclera: Conjunctivae normal.     Pupils: Pupils are equal, round, and reactive to light.  Cardiovascular:     Rate and Rhythm: Normal rate.  Pulmonary:      Effort: Pulmonary effort is normal. No respiratory distress.     Breath sounds: Normal breath sounds.  Abdominal:     Tenderness: There is abdominal tenderness. There is no right CVA tenderness, left CVA tenderness, guarding or rebound.  Musculoskeletal:        General: Normal range of motion.     Cervical back: Normal range of motion.  Skin:    General: Skin is warm.     Capillary Refill: Capillary refill takes less than 2 seconds.  Neurological:     General: No focal deficit present.     Mental Status: She is alert.  Psychiatric:        Mood and Affect: Mood normal.        Behavior: Behavior normal.     (all labs ordered are listed, but only abnormal results are displayed) Labs Reviewed  URINALYSIS, W/ REFLEX TO CULTURE (INFECTION SUSPECTED) - Abnormal; Notable for the following components:      Result Value   APPearance CLOUDY (*)    Hgb urine dipstick MODERATE (*)    Protein, ur 30 (*)    Leukocytes,Ua LARGE (*)    Bacteria, UA RARE (*)    All other components within normal limits  BASIC METABOLIC PANEL WITH GFR - Abnormal; Notable for the following components:   Chloride 113 (*)    CO2 19 (*)    Creatinine, Ser 2.18 (*)    GFR, Estimated 25 (*)    All other components within normal limits  CBC WITH DIFFERENTIAL/PLATELET - Abnormal; Notable for the following components:   Platelets 149 (*)    All other components within normal limits  URINE CULTURE    EKG: None  Radiology: CT Head Wo Contrast Result Date: 03/07/2024 EXAM: CT HEAD WITHOUT CONTRAST 03/07/2024 12:54:00 PM TECHNIQUE: CT of the head was performed without the administration of intravenous contrast. Automated exposure control, iterative reconstruction, and/or weight based adjustment of the mA/kV was utilized to reduce the radiation dose to as low as reasonably achievable. COMPARISON: MRI head 04/27/2024. CLINICAL HISTORY: Head trauma, coagulopathy (Age [age]y); on eliquis . Pt to er, pt states that two weeks  ago she fell and hit her head, states that she has seen her pmd twice and was told that she needed to get checked out. Pt denies confusion, no vomiting, reports head pain. Pt states that she is also here for a UTI, ; states that it hurts to urinate and urinates frequently. FINDINGS: BRAIN AND VENTRICLES: No acute hemorrhage. No evidence of acute infarct. No hydrocephalus. No extra-axial collection. No mass effect or midline shift. Mild parenchymal volume loss. Atherosclerosis of the carotid siphons. ORBITS: No acute abnormality. SINUSES: No acute abnormality. SOFT TISSUES AND SKULL: No acute soft tissue  abnormality. No skull fracture. IMPRESSION: 1. No acute intracranial abnormality. Electronically signed by: Donnice Mania MD 03/07/2024 01:10 PM EDT RP Workstation: [id number]     Procedures   Medications Ordered in the ED  ondansetron  (ZOFRAN -ODT) disintegrating tablet 4 mg (4 mg Oral Given 03/07/24 1333)  acetaminophen  (TYLENOL ) tablet 1,000 mg (1,000 mg Oral Given 03/07/24 1409)    63 y.o. female presents to the ED with complaints of frequent urination with painful urination and a fall on blood thinners on September 8., this involves an extensive number of treatment options, and is a complaint that carries with it a high risk of complications and morbidity.  The differential diagnosis includes intracranial hemorrhage, UTI, pyelonephritis, hydronephrosis, nephrolithiasis, appendicitis, diverticulitis, gastritis, (Ddx)  On arrival pt is nontoxic, vitals unremarkable. Exam significant for lower bilateral abdominal pain.  Additional history obtained from chart review significant for patient being followed by hematology and placed on Eliquis  2.5 mg.  I ordered medication Tylenol  and Zofran  for pain and nausea  Lab Tests:  I Ordered, reviewed, and interpreted labs, which included:   Imaging Studies ordered:  I ordered imaging studies which included CT head without contrast, I independently visualized  and interpreted imaging which showed no acute findings  ED Course:    63 year old female presents to ED with complaints of urinary symptoms consistent with her typical UTIs including frequent urination and dysuria.  Patient also has some suprapubic pain.  Patient advises she fell on September 8 while weighing herself in the bathroom and fell straight back and hit her head on toilet.  At the time patient denies any LOC or dizziness.  Patient advises she did not have any lacerations or bleeding from the incident.  Patient is on Eliquis  for blood clots in the past.  She has had 1 DVT and 2 blood clots in her calf.  On exam patient has no obvious hematoma or lacerations to the back of the head.  Mild pain to palpation.  Patient does have suprapubic pain bilaterally which started this morning.  Patient has no other abdominal pain noted to palpation.  Patient has significant history of chronic kidney disease, chronic eye disease, vertigo, and coagulopathy.  Patient has had numerous UTIs in the past and stats this feels very similar.  Patient has CKD due to multiple cyst on her kidneys and is followed by nephrology.  Patient has no CVA tenderness.  Lungs are clear to auscultation in all fields.  Patient denies fever or nausea/vomiting.  On reevaluation patient is sitting comfortably in ED bed playing on phone.  Patient was advised of findings and advised she will be started on Bactrim due to kidney disease and previous cultures.  Patient was also advised to follow-up with PCP or nephrology for further management of course of UTI.  Patient was given strict return precautions for worsening symptoms such as fever, weakness, dizziness, confusion, chest pain, shortness of breath.  Patient advises she usually follows with nephrology for UTIs.  Patient advises she is comfortable with treatment plan and comfortable with discharge.  Portions of this note were generated with Scientist, clinical (histocompatibility and immunogenetics). Dictation errors may  occur despite best attempts at proofreading.   Final diagnoses:  Acute cystitis with hematuria    ED Discharge Orders          Ordered    sulfamethoxazole-trimethoprim (BACTRIM DS) 800-160 MG tablet  2 times daily        03/07/24 1430  Myriam Fonda RAMAN, PA-C 03/07/24 1440    Rogelia Jerilynn RAMAN, MD 03/07/24 [phone]

## 2024-03-07 NOTE — ED Notes (Signed)
 Pt ambulated with standby asist

## 2024-03-07 NOTE — ED Triage Notes (Signed)
 Pt to er, pt states that two weeks ago she fell and hit her head, states that she has seen her pmd twice and was told that she needed to get checked out.  Pt denies confusion, no vomiting, reports head pain. Pt states that she is also here for a UTI, states that it hurts to urinate and urinates frequently

## 2024-03-08 DIAGNOSIS — N3001 Acute cystitis with hematuria: Secondary | ICD-10-CM | POA: Diagnosis not present

## 2024-03-09 LAB — URINE CULTURE: Culture: 80000 — AB

## 2024-03-10 ENCOUNTER — Telehealth (HOSPITAL_BASED_OUTPATIENT_CLINIC_OR_DEPARTMENT_OTHER): Payer: Self-pay | Admitting: *Deleted

## 2024-03-10 NOTE — Telephone Encounter (Signed)
 Post ED Visit - Positive Culture Follow-up  Culture report reviewed by antimicrobial stewardship pharmacist: [facility] []  Rankin Dee, Pharm.D. []  Venetia Gully, Pharm.D., BCPS AQ-ID []  Garrel Crews, Pharm.D., BCPS []  Almarie Lunger, Pharm.D., BCPS []  [place], [address].D., BCPS, AAHIVP []  Rosaline Bihari, Pharm.D., BCPS, AAHIVP []  Vernell Meier, PharmD, BCPS []  Latanya Hint, PharmD, BCPS []  Donald Medley, PharmD, BCPS []  Rocky Bold, PharmD []  Dorothyann Alert, PharmD, BCPS [x]  Dorn Poot, PharmD  [facility] []  Rosaline Edison, PharmD []  Romona Bliss, PharmD []  Dolphus Roller, PharmD []  Veva Seip, Rph []  Vernell Daunt) Leonce, PharmD []  Eva Allis, PharmD []  Rosaline Millet, PharmD []  Iantha Batch, PharmD []  Arvin Gauss, PharmD []  Wanda Hasting, PharmD []  Ronal Rav, PharmD []  Rocky Slade, PharmD []  Bard Jeans, PharmD   Positive urine culture Treated with Sulfamethoxazole-Trimethoprim, organism sensitive to the same and no further patient follow-up is required at this time.  Dana [name] 03/10/2024, 10:14 AM

## 2024-03-11 DIAGNOSIS — F411 Generalized anxiety disorder: Secondary | ICD-10-CM | POA: Diagnosis not present

## 2024-03-11 DIAGNOSIS — G4700 Insomnia, unspecified: Secondary | ICD-10-CM | POA: Diagnosis not present

## 2024-03-11 DIAGNOSIS — F319 Bipolar disorder, unspecified: Secondary | ICD-10-CM | POA: Diagnosis not present

## 2024-03-11 DIAGNOSIS — Z79899 Other long term (current) drug therapy: Secondary | ICD-10-CM | POA: Diagnosis not present

## 2024-03-12 NOTE — Telephone Encounter (Signed)
 Patient left vm requesting return call for continued UTI symptoms following abx ordered by her PCP. Routed to clinical support.

## 2024-03-14 DIAGNOSIS — M5441 Lumbago with sciatica, right side: Secondary | ICD-10-CM | POA: Diagnosis not present

## 2024-03-14 DIAGNOSIS — G8929 Other chronic pain: Secondary | ICD-10-CM | POA: Diagnosis not present

## 2024-03-14 DIAGNOSIS — N184 Chronic kidney disease, stage 4 (severe): Secondary | ICD-10-CM | POA: Diagnosis not present

## 2024-03-14 DIAGNOSIS — Z79899 Other long term (current) drug therapy: Secondary | ICD-10-CM | POA: Diagnosis not present

## 2024-03-14 NOTE — Progress Notes (Signed)
 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 She reports back feels good   Change in Status Since Last Visit: No Pain:  Pain Assessment Pain Assessment: No/denies pain  OBJECTIVE  General Observation/Objective Measures: - Patient arrives in NAD              Interventions:  Therapeutic Exercise: 40' - LAQ 3# AW 3 x 10 each R/L  - Walking marches 3# AW 6 ft down and back 5 times x 3  - Back wards walking 3# AW 38ft x 5  - Standing hip extension 3# AW 3 x 10 each R/L  - Standing SB calf stretch  - Education on form and performance of today's treatment interventions  - Patient requires increased time spent on rest breaks   Education: Yes, as described in interventions  ASSESSMENT Patient responded well to today treatment session with occasional complaints of pain. Today's treatment session focused primarily on hip strengthening interventions in standing which is a progression for the patient as we have transition from primarily seated and supine activities. Patient does requires frequent and extended rest breaks between interventions. Patient remains a good candidate for skilled physical therapy in order to address her impairments and maximize her level of functioning.  Therapy Diagnosis:     ICD-10-CM   1. Chronic bilateral low back pain with right-sided sciatica  G89.29, M54.41    Progress Towards Goals:   Goals Addressed             This Visit's Progress   . PT Goal       Short Term  Goals (STG) - Time Frame visits 5 visits                STG 1: Patient will be independent with initial HEP in order to modulate symptoms and improve function   Baseline: Newly established    STG 2: Patient will report no more than 2/10 back pain with performance of more strenous higher level activities   Baseline: up to 7/10 pain    Long Term Goals (LTG) - Time Frame 10 visits                           LTG 1: Patient will be fully independent with advanced HEP in order to maximize function and reduce symptom occurrence   Baseline: Not yet established                LTG 2: Patient will be ambulatory without the use of straight cane in order to improve patient overall functional mobility   Baseline: Ambulatory with single point cane               LTG 3: Patient will demonstrate imroved Lumbar AROM WFL in order to improve patient performance of ADL's and self care task   Baseline:   Lumbar AROM Flexion   50% limited   Extension   50% limited   LEFT lateral flexion    25% limited  RIGHT lateral flexion  25% limited   LEFT rotation   25% limited   RIGHT rotation   25% limited                LTG 4: Patient will have an improved score on the LEFS by 25 point indicating improved performance of LE functional mobility task   Baseline: 9/80        PLAN Treatment Frequency and Duration:  Treatment Plan Details: 1 x a week for up to 10 visits  Recommended PT Treatment/Interventions: ADL skills [phone]); Electrical stimulation-attended (02967); Neuromuscular re-education [phone]); Self-care/home management [phone]); Manual therapy (97140); Therapeutic activity (97530); Electrical stimulation-unattended (02985); Therapeutic exercise (97110); Dry needling (1-2 muscles) (79439); Ultrasound (02964)  Recommended Consults:  None currently  Development of Plan of Care:  Patient participated in plan of care development.  Total Treatment Time (Time & Untimed): Total Treatment Time: 40 Total Time in  Timed Codes: Time in Timed Codes: 40     Treatment/Procedures Therapeutic Exercises minutes: 40           The patient has been instructed to contact our clinic if any questions or problems should arise.

## 2024-03-19 DIAGNOSIS — N2581 Secondary hyperparathyroidism of renal origin: Secondary | ICD-10-CM | POA: Diagnosis not present

## 2024-03-19 DIAGNOSIS — M5441 Lumbago with sciatica, right side: Secondary | ICD-10-CM | POA: Diagnosis not present

## 2024-03-19 DIAGNOSIS — N1419 Nephropathy induced by other drugs, medicaments and biological substances: Secondary | ICD-10-CM | POA: Diagnosis not present

## 2024-03-19 DIAGNOSIS — N184 Chronic kidney disease, stage 4 (severe): Secondary | ICD-10-CM | POA: Diagnosis not present

## 2024-03-19 DIAGNOSIS — N281 Cyst of kidney, acquired: Secondary | ICD-10-CM | POA: Diagnosis not present

## 2024-03-19 DIAGNOSIS — T56891A Toxic effect of other metals, accidental (unintentional), initial encounter: Secondary | ICD-10-CM | POA: Diagnosis not present

## 2024-03-19 DIAGNOSIS — I129 Hypertensive chronic kidney disease with stage 1 through stage 4 chronic kidney disease, or unspecified chronic kidney disease: Secondary | ICD-10-CM | POA: Diagnosis not present

## 2024-03-19 DIAGNOSIS — D631 Anemia in chronic kidney disease: Secondary | ICD-10-CM | POA: Diagnosis not present

## 2024-03-19 DIAGNOSIS — M549 Dorsalgia, unspecified: Secondary | ICD-10-CM | POA: Diagnosis not present

## 2024-03-26 DIAGNOSIS — K219 Gastro-esophageal reflux disease without esophagitis: Secondary | ICD-10-CM | POA: Diagnosis not present

## 2024-03-28 ENCOUNTER — Encounter: Payer: Self-pay | Admitting: Podiatry

## 2024-03-28 ENCOUNTER — Ambulatory Visit: Admitting: Podiatry

## 2024-03-28 VITALS — Ht 66.0 in | Wt 244.0 lb

## 2024-03-28 DIAGNOSIS — M7751 Other enthesopathy of right foot: Secondary | ICD-10-CM | POA: Diagnosis not present

## 2024-03-28 DIAGNOSIS — M7750 Other enthesopathy of unspecified foot: Secondary | ICD-10-CM | POA: Diagnosis not present

## 2024-03-28 DIAGNOSIS — G8929 Other chronic pain: Secondary | ICD-10-CM | POA: Diagnosis not present

## 2024-03-28 DIAGNOSIS — M5441 Lumbago with sciatica, right side: Secondary | ICD-10-CM | POA: Diagnosis not present

## 2024-03-28 NOTE — Progress Notes (Unsigned)
Working on losing weight

## 2024-03-29 ENCOUNTER — Other Ambulatory Visit: Payer: Self-pay | Admitting: Hematology and Oncology

## 2024-03-29 ENCOUNTER — Inpatient Hospital Stay

## 2024-03-29 ENCOUNTER — Inpatient Hospital Stay: Admitting: Hematology and Oncology

## 2024-03-29 DIAGNOSIS — I82599 Chronic embolism and thrombosis of other specified deep vein of unspecified lower extremity: Secondary | ICD-10-CM

## 2024-03-29 NOTE — Progress Notes (Deleted)
 [facility] [phone]   [phone]  PROGRESS NOTE  Patient Care Team: Rolinda Millman, MD as PCP - General (Family Medicine) Lavona Agent, MD as PCP - Cardiology (Cardiology)  Hematological/Oncological History # Recurrent DVT 11/18/2021: Doppler US : Nonocclusive DVT of intdeterminate age in left lower extremity involving common femoral vein. Treated with Eliquis  for 3 months. 03/09/2022: Doppler US : Acute, occlusive DVT in right lower extremity involving the gastrocnemius veins. Restarted Eliquis  therapy 09/27/2022: Establish care with [facility]  Interval History:  Dana [name] 63 y.o. female with medical history significant for recurrent DVT presents for a follow up visit. The patient's last visit was on 03/31/2023. In the interim since the last visit she has had no major changes in her health.  Dana [name] reports he has been okay overall in the interim since our last visit.  She reports that she unfortunately has had numerous falls this year.  She was following in November 2020 and with most recent fall in January.  She reports that she also had COVID in September after which time she was having difficulty with losing her balance and falling.  She has had several medications rearranged by her neurologist due to these episodes.  She notes that she does have some occasional arm bruising but no bruising anywhere else.  She does not having any trouble with overt bleeding such as nosebleeds, gum bleeding, or blood in the urine/stool.  She reports that she tolerates her Eliquis  well and is only $40 per month.  She reports that she is excited that she will be soon visiting her son who is in [employer] in [place] .  She reports that she would like to be on Wegovy and will be talking to her primary care doctor about it.  She is also working with physical therapy at home in order to improve her balance and strength.  She denies any fevers, chills, sweats,  nausea, Oni or diarrhea.  A full 10 point ROS is otherwise negative.  MEDICAL HISTORY:  Past Medical History:  Diagnosis Date   Arthritis    oa   Bipolar disorder (HCC)    Chronic foot pain    Complication of anesthesia    COPD (chronic obstructive pulmonary disease) (HCC)    DVT (deep venous thrombosis) (HCC)    Involving bilateral lower extremities   GERD (gastroesophageal reflux disease)    Headache    migraines   Hyperlipidemia    Hypertension    Neuropathy    Palpitations last 1 month ago   PONV (postoperative nausea and vomiting)    Sleep apnea     SURGICAL HISTORY: Past Surgical History:  Procedure Laterality Date   ABDOMINAL HYSTERECTOMY  1998   complete   CESAREAN SECTION  1991   CHOLECYSTECTOMY  2005   FOOT SURGERY     left hip muscle tear surgery  2008   shoulder scope Left 2015   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   Procedure: LEFT TOTAL HIP ARTHROPLASTY ANTERIOR APPROACH;  Surgeon: Dempsey Moan, MD;  Location: [facility];  Service: Orthopedics;  Laterality: Left;   TUBAL LIGATION  1992    SOCIAL HISTORY: Social History   Socioeconomic History   Marital status: Widowed    Spouse name: Not on file   Number of children: 2   Years of education: Not on file   Highest education level: High school graduate  Occupational History    Comment: disabled  Tobacco Use   Smoking status:  Former    Current packs/day: 0.00    Average packs/day: 1 pack/day for 34.0 years (34.0 ttl pk-yrs)    Types: Cigarettes, E-cigarettes    Start date: 06/06/1977    Quit date: 06/07/2011    Years since quitting: 12.8   Smokeless tobacco: Never  Vaping Use   Vaping status: Every Day   Start date: 02/04/2022   Substances: Nicotine, Flavoring  Substance and Sexual Activity   Alcohol use: No   Drug use: No   Sexual activity: Not on file  Other Topics Concern   Not on file  Social History Narrative   Lives alone   Social Drivers of Health   Financial Resource Strain: Low  Risk  (09/29/2021)   Received from [facility]   Overall Financial Resource Strain (CARDIA)    Difficulty of Paying Living Expenses: Not hard at all  Food Insecurity: No Food Insecurity (06/18/2023)   Hunger Vital Sign    Worried About Running Out of Food in the Last Year: Never true    Ran Out of Food in the Last Year: Never true  Transportation Needs: No Transportation Needs (06/18/2023)   PRAPARE - Administrator, Civil Service (Medical): No    Lack of Transportation (Non-Medical): No  Physical Activity: Inactive (09/29/2021)   Received from [facility]   Exercise Vital Sign    On average, how many days per week do you engage in moderate to strenuous exercise (like a brisk walk)?: 0 days    On average, how many minutes do you engage in exercise at this level?: 0 min  Stress: No Stress Concern Present (09/29/2021)   Received from [facility] - Occupational Stress Questionnaire    Feeling of Stress : Not at all  Social Connections: Unknown (10/05/2022)   Received from [facility]   Social Network    Social Network: Not on file  Intimate Partner Violence: Not At Risk (06/18/2023)   Humiliation, Afraid, Rape, and Kick questionnaire    Fear of Current or Ex-Partner: No    Emotionally Abused: No    Physically Abused: No    Sexually Abused: No    FAMILY HISTORY: Family History  Problem Relation Age of Onset   Mood Disorder Mother    Colon cancer Mother    Pulmonary fibrosis Father    Idiopathic pulmonary fibrosis Father        worked in a mill/asbestos exp   Pulmonary fibrosis Sister    Bipolar disorder Brother    Bipolar disorder Son     ALLERGIES:  is allergic to anesthetics, amide; other; oxybutynin; hyoscyamine; and codeine.  MEDICATIONS:  Current Outpatient Medications  Medication Sig Dispense Refill   Acetaminophen  (TYLENOL ) 325 MG CAPS 3 capsules     ALPRAZolam  (XANAX ) 0.5 MG tablet Take 1 tablet (0.5 mg  total) by mouth 2 (two) times daily as needed for anxiety. 60 tablet 5   amitriptyline  (ELAVIL ) 25 MG tablet Take 25 mg by mouth at bedtime.     divalproex  (DEPAKOTE  ER) 250 MG 24 hr tablet Take 1 tablet (250 mg total) by mouth daily. Take with a 500 mg tablet to equal total dose of 750 mg 90 tablet 1   divalproex  (DEPAKOTE  ER) 500 MG 24 hr tablet Take 1 tablet (500 mg total) by mouth at bedtime. Take with a 250 mg tablet to equal total dose of 750 mg 90 tablet 1   ELIQUIS  2.5 MG TABS tablet  TAKE 1 TABLET(2.5 MG) BY MOUTH TWICE DAILY 60 tablet 6   esomeprazole (NEXIUM) 40 MG capsule Take 40 mg by mouth 2 (two) times daily before a meal.     fluticasone (FLONASE) 50 MCG/ACT nasal spray Place 1 spray into the nose daily as needed for allergies or rhinitis.     folic acid  (FOLVITE ) 1 MG tablet Take 1 tablet (1 mg total) by mouth daily. 30 tablet 2   furosemide (LASIX) 20 MG tablet PRN     methylPREDNISolone  (MEDROL  DOSEPAK) 4 MG TBPK tablet Take as directed 21 tablet 0   metoprolol  tartrate (LOPRESSOR ) 25 MG tablet TAKE 1 TABLET(25 MG) BY MOUTH TWICE DAILY 180 tablet 1   ondansetron  (ZOFRAN ) 4 MG tablet as needed.     oxyCODONE  (OXY IR/ROXICODONE ) 5 MG immediate release tablet Take 5 mg by mouth in the morning and at bedtime.     rizatriptan (MAXALT) 10 MG tablet Take by mouth.     rosuvastatin  (CRESTOR ) 40 MG tablet TAKE 1 TABLET(40 MG) BY MOUTH DAILY 90 tablet 3   tamsulosin (FLOMAX) 0.4 MG CAPS capsule Take 0.4 mg by mouth daily.     No current facility-administered medications for this visit.    REVIEW OF SYSTEMS:   Constitutional: ( - ) fevers, ( - )  chills , ( - ) night sweats Eyes: ( - ) blurriness of vision, ( - ) double vision, ( - ) watery eyes Ears, nose, mouth, throat, and face: ( - ) mucositis, ( - ) sore throat Respiratory: ( - ) cough, ( - ) dyspnea, ( - ) wheezes Cardiovascular: ( - ) palpitation, ( - ) chest discomfort, ( - ) lower extremity swelling Gastrointestinal:  ( - )  nausea, ( - ) heartburn, ( - ) change in bowel habits Skin: ( - ) abnormal skin rashes Lymphatics: ( - ) new lymphadenopathy, ( - ) easy bruising Neurological: ( - ) numbness, ( - ) tingling, ( - ) new weaknesses Behavioral/Psych: ( - ) mood change, ( - ) new changes  All other systems were reviewed with the patient and are negative.  PHYSICAL EXAMINATION:  There were no vitals filed for this visit.  There were no vitals filed for this visit.   GENERAL: Well-appearing middle-aged female, alert, no distress and comfortable SKIN: skin color, texture, turgor are normal, no rashes or significant lesions EYES: conjunctiva are pink and non-injected, sclera clear LUNGS: clear to auscultation and percussion with normal breathing effort HEART: regular rate & rhythm and no murmurs and no lower extremity edema Musculoskeletal: no cyanosis of digits and no clubbing  PSYCH: alert & oriented x 3, fluent speech NEURO: no focal motor/sensory deficits  LABORATORY DATA:  I have reviewed the data as listed    Latest Ref Rng & Units 03/07/2024    1:34 PM 09/29/2023   10:31 AM 06/21/2023    4:15 AM  CBC  WBC 4.0 - 10.5 K/uL 9.9  5.8  6.2   Hemoglobin 12.0 - 15.0 g/dL 86.9  87.2  87.8   Hematocrit 36.0 - 46.0 % 38.9  37.4  37.6   Platelets 150 - 400 K/uL 149  145  220        Latest Ref Rng & Units 03/07/2024    1:34 PM 09/29/2023   10:31 AM 06/21/2023    4:15 AM  CMP  Glucose 70 - 99 mg/dL 85  885  898   BUN 8 - 23 mg/dL 23  23  31   Creatinine 0.44 - 1.00 mg/dL 7.81  7.87  7.92   Sodium 135 - 145 mmol/L 144  145  144   Potassium 3.5 - 5.1 mmol/L 4.3  4.3  4.1   Chloride 98 - 111 mmol/L 113  115  113   CO2 22 - 32 mmol/L 19  25  22    Calcium  8.9 - 10.3 mg/dL 9.9  9.3  9.3   Total Protein 6.5 - 8.1 g/dL  6.3    Total Bilirubin 0.0 - 1.2 mg/dL  0.4    Alkaline Phos 38 - 126 U/L  69    AST 15 - 41 U/L  11    ALT 0 - 44 U/L  10     RADIOGRAPHIC STUDIES: CT Head Wo Contrast Result Date:  03/07/2024 EXAM: CT HEAD WITHOUT CONTRAST 03/07/2024 12:54:00 PM TECHNIQUE: CT of the head was performed without the administration of intravenous contrast. Automated exposure control, iterative reconstruction, and/or weight based adjustment of the mA/kV was utilized to reduce the radiation dose to as low as reasonably achievable. COMPARISON: MRI head 04/27/2024. CLINICAL HISTORY: Head trauma, coagulopathy (Age [age]y); on eliquis . Pt to er, pt states that two weeks ago she fell and hit her head, states that she has seen her pmd twice and was told that she needed to get checked out. Pt denies confusion, no vomiting, reports head pain. Pt states that she is also here for a UTI, ; states that it hurts to urinate and urinates frequently. FINDINGS: BRAIN AND VENTRICLES: No acute hemorrhage. No evidence of acute infarct. No hydrocephalus. No extra-axial collection. No mass effect or midline shift. Mild parenchymal volume loss. Atherosclerosis of the carotid siphons. ORBITS: No acute abnormality. SINUSES: No acute abnormality. SOFT TISSUES AND SKULL: No acute soft tissue abnormality. No skull fracture. IMPRESSION: 1. No acute intracranial abnormality. Electronically signed by: Donnice Mania MD 03/07/2024 01:10 PM EDT RP Workstation: [id number]    ASSESSMENT & PLAN Dana [name] 63 y.o. female with medical history significant for recurrent DVT presents for a follow up visit.   We reviewed risk factors that can provoke a venous thromboembolism (VTE) including prolonged travel/immobility, surgery (particular abdominal or orthropedic), trauma,  and pregnancy/ estrogen containing birth control. After a detailed history and review of the records there is no clear provoking factor for this patient's VTE.  Patients with unprovoked VTEs have up to 25% recurrence after 5 years and 36% at 10 years, with 4% of these clots being fatal ([phone]). Therefore the formal recommendation for unprovoked VTE's is lifelong  anticoagulation, as the cause may not be transient or reversible. We recommend 6 months or full strength anticoagulation with a re-evaluation after that time.  The patient's will then have a choice of maintenance dose DOAC (preferred, recommended), 81mg  ASA PO daily (non-preferred), or no further anticoagulation (not recommended).    #Unprovoked DVT --First episode occurred on 11/18/2021. Nonocclusive DVT of intdeterminate age in left lower extremity involving common femoral vein. Felt to be unprovoked. Treated with Eliquis  for 3 months. --Second episode occurred on 03/09/2022.Acute, occlusive DVT in right lower extremity involving the gastrocnemius veins. Felt to be unprovoked. Restarted Eliquis  therapy --Findings at this time are consistent with a unprovoked VTE PLAN: --Recommend indefinite anticoagulation. Patient is currently on eliquis  maintenance dose of 2.5 mg BID.  --patient denies any bleeding, bruising, or dark stools on this medication. It is well tolerated. No difficulties accessing/affording the medication --labs today show white blood cell 5.8, hemoglobin 12.7, MCV  86.6, platelets 145.  Creatinine 2.12, LFTs within normal limits. --RTC in 6 months' time with strict return precautions for overt signs of bleeding.    No orders of the defined types were placed in this encounter.   All questions were answered. The patient knows to call the clinic with any problems, questions or concerns.  A total of more than 25 minutes were spent on this encounter with face-to-face time and non-face-to-face time, including preparing to see the patient, ordering tests and/or medications, counseling the patient and coordination of care as outlined above.   Norleen IVAR Kidney, MD Department of Hematology/Oncology [facility] at [facility] Phone: [phone] Pager: [phone] Email: [email or web address]  03/29/2024 7:50 AM

## 2024-04-02 ENCOUNTER — Telehealth: Payer: Self-pay | Admitting: Hematology and Oncology

## 2024-04-03 ENCOUNTER — Inpatient Hospital Stay (HOSPITAL_BASED_OUTPATIENT_CLINIC_OR_DEPARTMENT_OTHER): Admitting: Hematology and Oncology

## 2024-04-03 ENCOUNTER — Inpatient Hospital Stay: Attending: Hematology and Oncology

## 2024-04-03 VITALS — BP 122/80 | HR 77 | Temp 97.7°F | Resp 14 | Wt 249.3 lb

## 2024-04-03 DIAGNOSIS — N183 Chronic kidney disease, stage 3 unspecified: Secondary | ICD-10-CM

## 2024-04-03 DIAGNOSIS — I82599 Chronic embolism and thrombosis of other specified deep vein of unspecified lower extremity: Secondary | ICD-10-CM

## 2024-04-03 DIAGNOSIS — Z79899 Other long term (current) drug therapy: Secondary | ICD-10-CM | POA: Diagnosis not present

## 2024-04-03 DIAGNOSIS — Z7901 Long term (current) use of anticoagulants: Secondary | ICD-10-CM | POA: Insufficient documentation

## 2024-04-03 DIAGNOSIS — M5441 Lumbago with sciatica, right side: Secondary | ICD-10-CM | POA: Diagnosis not present

## 2024-04-03 DIAGNOSIS — Z7982 Long term (current) use of aspirin: Secondary | ICD-10-CM | POA: Diagnosis not present

## 2024-04-03 DIAGNOSIS — G8929 Other chronic pain: Secondary | ICD-10-CM | POA: Diagnosis not present

## 2024-04-03 DIAGNOSIS — D649 Anemia, unspecified: Secondary | ICD-10-CM

## 2024-04-03 DIAGNOSIS — Z86718 Personal history of other venous thrombosis and embolism: Secondary | ICD-10-CM | POA: Insufficient documentation

## 2024-04-03 DIAGNOSIS — N184 Chronic kidney disease, stage 4 (severe): Secondary | ICD-10-CM | POA: Diagnosis not present

## 2024-04-03 DIAGNOSIS — J449 Chronic obstructive pulmonary disease, unspecified: Secondary | ICD-10-CM | POA: Diagnosis not present

## 2024-04-03 DIAGNOSIS — Z79891 Long term (current) use of opiate analgesic: Secondary | ICD-10-CM | POA: Insufficient documentation

## 2024-04-03 DIAGNOSIS — F17290 Nicotine dependence, other tobacco product, uncomplicated: Secondary | ICD-10-CM | POA: Insufficient documentation

## 2024-04-03 DIAGNOSIS — Z800 Family history of malignant neoplasm of digestive organs: Secondary | ICD-10-CM | POA: Insufficient documentation

## 2024-04-03 DIAGNOSIS — N189 Chronic kidney disease, unspecified: Secondary | ICD-10-CM | POA: Diagnosis not present

## 2024-04-03 DIAGNOSIS — I10 Essential (primary) hypertension: Secondary | ICD-10-CM | POA: Diagnosis not present

## 2024-04-03 LAB — CBC WITH DIFFERENTIAL (CANCER CENTER ONLY)
Abs Immature Granulocytes: 0.01 K/uL (ref 0.00–0.07)
Basophils Absolute: 0 K/uL (ref 0.0–0.1)
Basophils Relative: 1 %
Eosinophils Absolute: 0 K/uL (ref 0.0–0.5)
Eosinophils Relative: 1 %
HCT: 38.1 % (ref 36.0–46.0)
Hemoglobin: 13 g/dL (ref 12.0–15.0)
Immature Granulocytes: 0 %
Lymphocytes Relative: 33 %
Lymphs Abs: 1.9 K/uL (ref 0.7–4.0)
MCH: 30.6 pg (ref 26.0–34.0)
MCHC: 34.1 g/dL (ref 30.0–36.0)
MCV: 89.6 fL (ref 80.0–100.0)
Monocytes Absolute: 0.5 K/uL (ref 0.1–1.0)
Monocytes Relative: 8 %
Neutro Abs: 3.3 K/uL (ref 1.7–7.7)
Neutrophils Relative %: 57 %
Platelet Count: 152 K/uL (ref 150–400)
RBC: 4.25 MIL/uL (ref 3.87–5.11)
RDW: 13.2 % (ref 11.5–15.5)
WBC Count: 5.7 K/uL (ref 4.0–10.5)
nRBC: 0 % (ref 0.0–0.2)

## 2024-04-03 LAB — CMP (CANCER CENTER ONLY)
ALT: 18 U/L (ref 0–44)
AST: 21 U/L (ref 15–41)
Albumin: 4.1 g/dL (ref 3.5–5.0)
Alkaline Phosphatase: 57 U/L (ref 38–126)
Anion gap: 6 (ref 5–15)
BUN: 22 mg/dL (ref 8–23)
CO2: 25 mmol/L (ref 22–32)
Calcium: 9.5 mg/dL (ref 8.9–10.3)
Chloride: 110 mmol/L (ref 98–111)
Creatinine: 2.34 mg/dL — ABNORMAL HIGH (ref 0.44–1.00)
GFR, Estimated: 23 mL/min — ABNORMAL LOW (ref >60)
Glucose, Bld: 89 mg/dL (ref 70–99)
Potassium: 4.4 mmol/L (ref 3.5–5.1)
Sodium: 141 mmol/L (ref 135–145)
Total Bilirubin: 0.5 mg/dL (ref 0.0–1.2)
Total Protein: 6.3 g/dL — ABNORMAL LOW (ref 6.5–8.1)

## 2024-04-03 MED ORDER — APIXABAN 2.5 MG PO TABS: 2.5000 mg | ORAL_TABLET | Freq: Two times a day (BID) | ORAL | 6 refills | Status: DC

## 2024-04-03 NOTE — Progress Notes (Signed)
 [facility] [phone]   [phone]  PROGRESS NOTE  Patient Care Team: Rolinda Millman, MD as PCP - General (Family Medicine) Lavona Agent, MD as PCP - Cardiology (Cardiology)  Hematological/Oncological History # Recurrent DVT 11/18/2021: Doppler US : Nonocclusive DVT of intdeterminate age in left lower extremity involving common femoral vein. Treated with Eliquis  for 3 months. 03/09/2022: Doppler US : Acute, occlusive DVT in right lower extremity involving the gastrocnemius veins. Restarted Eliquis  therapy 09/27/2022: Establish care with [facility] Hematology  Interval History:  Dana [name] 63 y.o. female with medical history significant for recurrent DVT presents for a follow up visit. The patient's last visit was on 09/29/2023. In the interim since the last visit she has had no major changes in her health.  Dana [name] reports reports that she has been well overall in interim since her last visit in April.  She reports that she does have bouts of time where she gets fatigued and tired.  She missed her last visit due to runny nose and fatigue.  She reports she is doing her best to try to lose weight as if she can get her BMI less than 35 she may be candidate for kidney transplant from her son.  She reports that she is working with physical therapy to strengthen her legs and spine.  Additionally she was taken off her gabapentin  therapy.  She is using [employer] as a weight loss plan.  She reports that she has lost 10 pounds on her own and her goal is to get down to 217 pounds.  She reports that she is not having any bleeding, bruising, or dark stools and the Eliquis .  She is not having any signs or symptoms concerning for recurrent VTE though she does get some occasional cramping in her lower extremities.  Overall she feels well and has no questions concerns or complaints today.  She is willing and able to continue on Eliquis  therapy this time.  Full 10 point ROS  otherwise negative.  MEDICAL HISTORY:  Past Medical History:  Diagnosis Date   Arthritis    oa   Bipolar disorder (HCC)    Chronic foot pain    Complication of anesthesia    COPD (chronic obstructive pulmonary disease) (HCC)    DVT (deep venous thrombosis) (HCC)    Involving bilateral lower extremities   GERD (gastroesophageal reflux disease)    Headache    migraines   Hyperlipidemia    Hypertension    Neuropathy    Palpitations last 1 month ago   PONV (postoperative nausea and vomiting)    Sleep apnea     SURGICAL HISTORY: Past Surgical History:  Procedure Laterality Date   ABDOMINAL HYSTERECTOMY  1998   complete   CESAREAN SECTION  1991   CHOLECYSTECTOMY  2005   FOOT SURGERY     left hip muscle tear surgery  2008   shoulder scope Left 2015   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   Procedure: LEFT TOTAL HIP ARTHROPLASTY ANTERIOR APPROACH;  Surgeon: Dempsey Moan, MD;  Location: [facility];  Service: Orthopedics;  Laterality: Left;   TUBAL LIGATION  1992    SOCIAL HISTORY: Social History   Socioeconomic History   Marital status: Widowed    Spouse name: Not on file   Number of children: 2   Years of education: Not on file   Highest education level: High school graduate  Occupational History    Comment: disabled  Tobacco Use   Smoking status: Former  Current packs/day: 0.00    Average packs/day: 1 pack/day for 34.0 years (34.0 ttl pk-yrs)    Types: Cigarettes, E-cigarettes    Start date: 06/06/1977    Quit date: 06/07/2011    Years since quitting: 12.8   Smokeless tobacco: Never  Vaping Use   Vaping status: Every Day   Start date: 02/04/2022   Substances: Nicotine, Flavoring  Substance and Sexual Activity   Alcohol use: No   Drug use: No   Sexual activity: Not on file  Other Topics Concern   Not on file  Social History Narrative   Lives alone   Social Drivers of Health   Financial Resource Strain: Low Risk  (09/29/2021)   Received from [facility]    Overall Financial Resource Strain (CARDIA)    Difficulty of Paying Living Expenses: Not hard at all  Food Insecurity: No Food Insecurity (06/18/2023)   Hunger Vital Sign    Worried About Running Out of Food in the Last Year: Never true    Ran Out of Food in the Last Year: Never true  Transportation Needs: No Transportation Needs (06/18/2023)   PRAPARE - Administrator, Civil Service (Medical): No    Lack of Transportation (Non-Medical): No  Physical Activity: Inactive (09/29/2021)   Received from [facility]   Exercise Vital Sign    On average, how many days per week do you engage in moderate to strenuous exercise (like a brisk walk)?: 0 days    On average, how many minutes do you engage in exercise at this level?: 0 min  Stress: No Stress Concern Present (09/29/2021)   Received from [facility] - Occupational Stress Questionnaire    Feeling of Stress : Not at all  Social Connections: Unknown (10/05/2022)   Received from [facility]   Social Network    Social Network: Not on file  Intimate Partner Violence: Not At Risk (06/18/2023)   Humiliation, Afraid, Rape, and Kick questionnaire    Fear of Current or Ex-Partner: No    Emotionally Abused: No    Physically Abused: No    Sexually Abused: No    FAMILY HISTORY: Family History  Problem Relation Age of Onset   Mood Disorder Mother    Colon cancer Mother    Pulmonary fibrosis Father    Idiopathic pulmonary fibrosis Father        worked in a mill/asbestos exp   Pulmonary fibrosis Sister    Bipolar disorder Brother    Bipolar disorder Son     ALLERGIES:  is allergic to anesthetics, amide; other; oxybutynin; hyoscyamine; and codeine.  MEDICATIONS:  Current Outpatient Medications  Medication Sig Dispense Refill   Acetaminophen  (TYLENOL ) 325 MG CAPS 3 capsules     ALPRAZolam  (XANAX ) 0.5 MG tablet Take 1 tablet (0.5 mg total) by mouth 2 (two) times daily as needed for  anxiety. 60 tablet 5   amitriptyline  (ELAVIL ) 25 MG tablet Take 25 mg by mouth at bedtime.     apixaban  (ELIQUIS ) 2.5 MG TABS tablet Take 1 tablet (2.5 mg total) by mouth 2 (two) times daily. 60 tablet 6   divalproex  (DEPAKOTE  ER) 250 MG 24 hr tablet Take 1 tablet (250 mg total) by mouth daily. Take with a 500 mg tablet to equal total dose of 750 mg 90 tablet 1   divalproex  (DEPAKOTE  ER) 500 MG 24 hr tablet Take 1 tablet (500 mg total) by mouth at bedtime. Take with a 250  mg tablet to equal total dose of 750 mg 90 tablet 1   esomeprazole (NEXIUM) 40 MG capsule Take 40 mg by mouth 2 (two) times daily before a meal.     fluticasone (FLONASE) 50 MCG/ACT nasal spray Place 1 spray into the nose daily as needed for allergies or rhinitis.     folic acid  (FOLVITE ) 1 MG tablet Take 1 tablet (1 mg total) by mouth daily. 30 tablet 2   furosemide (LASIX) 20 MG tablet PRN     methylPREDNISolone  (MEDROL  DOSEPAK) 4 MG TBPK tablet Take as directed 21 tablet 0   metoprolol  tartrate (LOPRESSOR ) 25 MG tablet TAKE 1 TABLET(25 MG) BY MOUTH TWICE DAILY 180 tablet 1   ondansetron  (ZOFRAN ) 4 MG tablet as needed.     oxyCODONE  (OXY IR/ROXICODONE ) 5 MG immediate release tablet Take 5 mg by mouth in the morning and at bedtime.     rizatriptan (MAXALT) 10 MG tablet Take by mouth.     rosuvastatin  (CRESTOR ) 40 MG tablet TAKE 1 TABLET(40 MG) BY MOUTH DAILY 90 tablet 3   tamsulosin (FLOMAX) 0.4 MG CAPS capsule Take 0.4 mg by mouth daily.     No current facility-administered medications for this visit.    REVIEW OF SYSTEMS:   Constitutional: ( - ) fevers, ( - )  chills , ( - ) night sweats Eyes: ( - ) blurriness of vision, ( - ) double vision, ( - ) watery eyes Ears, nose, mouth, throat, and face: ( - ) mucositis, ( - ) sore throat Respiratory: ( - ) cough, ( - ) dyspnea, ( - ) wheezes Cardiovascular: ( - ) palpitation, ( - ) chest discomfort, ( - ) lower extremity swelling Gastrointestinal:  ( - ) nausea, ( - )  heartburn, ( - ) change in bowel habits Skin: ( - ) abnormal skin rashes Lymphatics: ( - ) new lymphadenopathy, ( - ) easy bruising Neurological: ( - ) numbness, ( - ) tingling, ( - ) new weaknesses Behavioral/Psych: ( - ) mood change, ( - ) new changes  All other systems were reviewed with the patient and are negative.  PHYSICAL EXAMINATION:  Vitals:   04/03/24 1017  BP: 122/80  Pulse: 77  Resp: 14  Temp: 97.7 F (36.5 C)  SpO2: 98%    Filed Weights   04/03/24 1017  Weight: 249 lb 4.8 oz (113.1 kg)     GENERAL: Well-appearing middle-aged female, alert, no distress and comfortable SKIN: skin color, texture, turgor are normal, no rashes or significant lesions EYES: conjunctiva are pink and non-injected, sclera clear LUNGS: clear to auscultation and percussion with normal breathing effort HEART: regular rate & rhythm and no murmurs and no lower extremity edema Musculoskeletal: no cyanosis of digits and no clubbing  PSYCH: alert & oriented x 3, fluent speech NEURO: no focal motor/sensory deficits  LABORATORY DATA:  I have reviewed the data as listed    Latest Ref Rng & Units 04/03/2024    9:57 AM 03/07/2024    1:34 PM 09/29/2023   10:31 AM  CBC  WBC 4.0 - 10.5 K/uL 5.7  9.9  5.8   Hemoglobin 12.0 - 15.0 g/dL 86.9  86.9  87.2   Hematocrit 36.0 - 46.0 % 38.1  38.9  37.4   Platelets 150 - 400 K/uL 152  149  145        Latest Ref Rng & Units 04/03/2024    9:57 AM 03/07/2024    1:34 PM 09/29/2023  10:31 AM  CMP  Glucose 70 - 99 mg/dL 89  85  885   BUN 8 - 23 mg/dL 22  23  23    Creatinine 0.44 - 1.00 mg/dL 7.65  7.81  7.87   Sodium 135 - 145 mmol/L 141  144  145   Potassium 3.5 - 5.1 mmol/L 4.4  4.3  4.3   Chloride 98 - 111 mmol/L 110  113  115   CO2 22 - 32 mmol/L 25  19  25    Calcium  8.9 - 10.3 mg/dL 9.5  9.9  9.3   Total Protein 6.5 - 8.1 g/dL 6.3   6.3   Total Bilirubin 0.0 - 1.2 mg/dL 0.5   0.4   Alkaline Phos 38 - 126 U/L 57   69   AST 15 - 41 U/L 21   11    ALT 0 - 44 U/L 18   10    RADIOGRAPHIC STUDIES: CT Head Wo Contrast Result Date: 03/07/2024 EXAM: CT HEAD WITHOUT CONTRAST 03/07/2024 12:54:00 PM TECHNIQUE: CT of the head was performed without the administration of intravenous contrast. Automated exposure control, iterative reconstruction, and/or weight based adjustment of the mA/kV was utilized to reduce the radiation dose to as low as reasonably achievable. COMPARISON: MRI head 04/27/2024. CLINICAL HISTORY: Head trauma, coagulopathy (Age 59-64y); on eliquis . Pt to er, pt states that two weeks ago she fell and hit her head, states that she has seen her pmd twice and was told that she needed to get checked out. Pt denies confusion, no vomiting, reports head pain. Pt states that she is also here for a UTI, ; states that it hurts to urinate and urinates frequently. FINDINGS: BRAIN AND VENTRICLES: No acute hemorrhage. No evidence of acute infarct. No hydrocephalus. No extra-axial collection. No mass effect or midline shift. Mild parenchymal volume loss. Atherosclerosis of the carotid siphons. ORBITS: No acute abnormality. SINUSES: No acute abnormality. SOFT TISSUES AND SKULL: No acute soft tissue abnormality. No skull fracture. IMPRESSION: 1. No acute intracranial abnormality. Electronically signed by: Donnice Mania MD 03/07/2024 01:10 PM EDT RP Workstation: [id number]    ASSESSMENT & PLAN Karna KANDICE [place] 63 y.o. female with medical history significant for recurrent DVT presents for a follow up visit.   We reviewed risk factors that can provoke a venous thromboembolism (VTE) including prolonged travel/immobility, surgery (particular abdominal or orthropedic), trauma,  and pregnancy/ estrogen containing birth control. After a detailed history and review of the records there is no clear provoking factor for this patient's VTE.  Patients with unprovoked VTEs have up to 25% recurrence after 5 years and 36% at 10 years, with 4% of these clots being fatal (BMJ  [id number]). Therefore the formal recommendation for unprovoked VTE's is lifelong anticoagulation, as the cause may not be transient or reversible. We recommend 6 months or full strength anticoagulation with a re-evaluation after that time.  The patient's will then have a choice of maintenance dose DOAC (preferred, recommended), 81mg  ASA PO daily (non-preferred), or no further anticoagulation (not recommended).    #Unprovoked DVT --First episode occurred on 11/18/2021. Nonocclusive DVT of intdeterminate age in left lower extremity involving common femoral vein. Felt to be unprovoked. Treated with Eliquis  for 3 months. --Second episode occurred on 03/09/2022.Acute, occlusive DVT in right lower extremity involving the gastrocnemius veins. Felt to be unprovoked. Restarted Eliquis  therapy --Findings at this time are consistent with a unprovoked VTE PLAN: --Recommend indefinite anticoagulation. Patient is currently on eliquis  maintenance dose of 2.5 mg BID.  --  patient denies any bleeding, bruising, or dark stools on this medication. It is well tolerated. No difficulties accessing/affording the medication --labs today show white blood cell 5.7, Hgb 13.0, MCV 89.6, Plt 152.  Creatinine 2.34, LFTs within normal limits. --RTC in 6 months' time with strict return precautions for overt signs of bleeding.    No orders of the defined types were placed in this encounter.   All questions were answered. The patient knows to call the clinic with any problems, questions or concerns.  A total of more than 25 minutes were spent on this encounter with face-to-face time and non-face-to-face time, including preparing to see the patient, ordering tests and/or medications, counseling the patient and coordination of care as outlined above.   Norleen IVAR Kidney, MD Department of Hematology/Oncology [facility] Phone: [phone] Pager: [phone] Email:  [email or web address]  04/03/2024 10:41 AM

## 2024-04-05 DIAGNOSIS — M1990 Unspecified osteoarthritis, unspecified site: Secondary | ICD-10-CM | POA: Diagnosis not present

## 2024-04-05 DIAGNOSIS — N3941 Urge incontinence: Secondary | ICD-10-CM | POA: Diagnosis not present

## 2024-04-05 DIAGNOSIS — N184 Chronic kidney disease, stage 4 (severe): Secondary | ICD-10-CM | POA: Diagnosis not present

## 2024-04-05 DIAGNOSIS — N1832 Chronic kidney disease, stage 3b: Secondary | ICD-10-CM | POA: Diagnosis not present

## 2024-04-05 DIAGNOSIS — I10 Essential (primary) hypertension: Secondary | ICD-10-CM | POA: Diagnosis not present

## 2024-04-05 DIAGNOSIS — N189 Chronic kidney disease, unspecified: Secondary | ICD-10-CM | POA: Diagnosis not present

## 2024-04-05 DIAGNOSIS — J449 Chronic obstructive pulmonary disease, unspecified: Secondary | ICD-10-CM | POA: Diagnosis not present

## 2024-04-05 NOTE — Progress Notes (Addendum)
 Chief Complaint: This patient is seen in consultation for  Frequency, bladder pain   Fall Screening     History of Present Illness:  05/2018: The patient who is a very nice 63 year old   lady is referred here because of the bladder pain and frequency.  She states that everything started in April of this year and she has developed some burning and pain during  voiding.  She had a urine test that was negative and just Azo was given to her and then later on pain got worse and she was treated with antibiotic, got a little better, but she states that pain and frequency came back again several times and now she has that pain that usually worse when bladder is full and also has burning when she pees at the tip of urethra.  She is taking her lithium  and her creatinine got   very mildly up and they have been decreased the dose.   She has a CT scan that is completely normal.  Her urine today is normal and she is here to seek further treatment for her bladder symptoms.  She does not have any UTI recently.  She never had kidney stones in the past. She is not sexually active. She drinks lots of water and moderate caffeine With impression of IC, I started her on Hydroxyzine and IC diet and no caffeine.  She states that her symptoms  With decrease in caffeine got much better and now just drinks one cup a day and feels the effects of it on bladder, but can handle  It. She  States that took hydroxyzine for one week and developed flu like symptoms and  Stopped it. Now just with diet and minimal caffeine her symptoms are acceptable for her. She also complains of frequency and urgency and mild urge inc and some degrees of SUI. Has ( voids  More frequent at night ) and does not have sleep apnea or edema or CHF. She states that at some point has been on myrbetriq and helped a lot, but could not afford it. I recommended: Discussed the following plan:  Myrbetriq  Will continue IC diet and minimal caffeine F/U in 6  mo If feels that her symptoms are getting worse, will call and we Rx Elmiron  10/05/2018:  I talked to the patient with [facility] video call.  She states that I started Myrbetriq and feels that urgency and urge incontinence has become much better.  She does not have any SUI.  She has some urgency, but no urge incontinence.  She complains of postvoid dribbling that is not bad enough to wear pads for it. ;leakge is almost size of a  teaspoon   urine after peeing.  She has gained weight, about 10 pounds recently being at home all the time.  Her voiding problem, the feeling of the pressure in the bladder is still there, but is mild, not bad enough to get a new medication for it.  She was wearing pads before starting Myrbetriq, but after that she does not need pads anymore.  She   drinks coffee, but eats lots of spicy food.  No alcohol, no smoking.  She has no other complaints, has not had UTIs in the past six months.  Creatinine has little rise to 1.52 few months ago and she is going to see a Nephrology for it.  Her base was 1.36 few months before that.  She is taking her blood pressure medicine, but would maybe need to  be changed.  Plan: Splitting legs and losing weight for post void dribbling ( very possible is vaginal voiding) Continue  Myrbetriq Hold on spicy foods for several weeks and look for any benefit in her bladder pressure Minimize caffeine.  May need more medication for IC ( can not tolerate Hydroxyzine)   F/U in 6 mo   07/19/2019; The patient having multiple telephone calls after her previous visit.  I prescribed Toviaz for her.  She got in for one month and   states that it was working very well.  Toviaz was good, but insurance did not cover  and was very expensive for her.  Also Myrbetriq  Was prescribed , but not too much effective.  She has developed COVID recently and it made her general health very bad.  She is now recovering, losing weight and losing edema that she had.  The other  concern that she has is just in the CT scan done for COVID, some small lesion in right kidney had been found.  It is small, it is probably cyst or could be indeed mass.  She is more concerned because her twin brother got kidney cancer recently, so she is very concerned about having cancer.  She has urge incontinence, needs three pads.  Had mild to moderate SUI too.  Has nocturia of two to three times.  She is very interested to restart Toviaz. The patient still drinks one mountain dew and two cups of coffee a day, and I advised her not to do that because of severe urge incontinence. GFR has decreased to 47 from 53   08/20/2019  She is back today for her urgency and frequency.  She notes that she is waiting for the Toviaz medical assistance to come through.  Since she was seen in February she has increased urgency and urgency incontinence with leaking of 5 soaked pads per day.  She not leaking in bed at night but does have to get up 2-3 times a night and leaks on the way to the bathroom.  She notes that she has a component of stress as well with large volume leakage with cough and sneeze.  She has stopped caffeine which is improved her slightly.  She still some pain with bladder filling but no plan with emptying.  She is on nephrologist who wants her lithium  adjusted to improve her kidney function.  PVR 60 mL Plan: 63 year old with a history of urgency frequency now with mixed urinary incontinence with urge predominant.  She has been on Myrbetriq and we have attempted to get her Toviaz.  This is been difficult given its expense.  She is waiting for medical assistance for it.  We will plan to bring her back in 2 to 3 weeks for urodynamics test.  If this test demonstrates urge predominant incontinence we will consider keeping her on her Toviaz if that is working or proceeding with cystoscopy with Botox injection in the bladder.  We need to see her on the Toviaz for 1 month to determine if it works.  If she  does not receive the Toviaz assistance then we can proceed with cystoscopy Botox.  I personally visited and evaluated the patient and agree with the plan  09/20/2019:  The patient could get Toviaz and after using it, her voiding has got much better, leakage has decreased a lot.  Pain in the bladder is decreased too.  Recently, urodynamics had been done and she states after urodynamics, her leakage has gotten worse and  feels that she needs to go to bathroom all the time.  She has taken some AZO pills.  Also, she is going to holiday with her boyfriend and asking can have sex because of potential UTI.  Overall, was doing very good before urodynamics.  Urodynamics showed no significant overactivity, but it was on Toviaz.  No other complaints. Kidney MRI:   1.  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 No suspicious appearing enhancing renal masses.  2.  Hepatic cysts. GFR is 38  Plan: U/C  Timed voiding No caffeine Continue Toviaz Bactrim  started F/U in 3 mo , may need  Botox  10/22/2019: Botox 100 Iu  01/17/2020: Patient is back after 3 months.  She has states Botox took a few weeks to start working.  But after a few weeks it worked great she did not have any leakage anymore she is not taking Toviaz.  In the past 2 weeks she feels  that symptoms are coming back.  At this point has some post void dribbling.  No urge incontinence.  I told her to split her legs for voiding the last visit.  She has states she does it and is helpful but he still has a few drops of  post void dribbling.  No other complaint  Medications:  Current Outpatient Medications  Medication Sig Dispense Refill  . ALPRAZolam  (XANAX ) 0.5 MG tablet Taking at night for sleep    2  . amLODIPine  (NORVASC ) 5 MG tablet Take 1 tablet (5 mg total) by mouth nightly.    . CHOLECALCIFEROL, VITAMIN D3, ORAL Take by mouth.    .  divalproex  ER (DEPAKOTE  ER) 250 MG 24 hr tablet     . divalproex  ER (DEPAKOTE  ER) 500 MG 24 hr tablet Take 750 mg by mouth daily.    . ELIQUIS  5 mg tablet *ANTICOAGULANT* Take 1 tablet (5 mg total) by mouth 2 times daily.    SABRA esomeprazole (NEXIUM) 40 MG capsule TAKE 1 CAPSULE BY MOUTH DAILY    . gabapentin  (NEURONTIN ) 300 MG capsule TAKE 1 CAPSULE(300 MG) BY MOUTH THREE TIMES DAILY 270 capsule 1  . lisinopriL (PRINIVIL,ZESTRIL) 5 MG tablet Take 1 tablet (5 mg total) by mouth daily.    . metoPROLOL  succinate (TOPROL -XL) 25 MG 24 hr tablet TAKE 1 TABLET BY MOUTH 2 TIMES DAILY AS NEEDED    . ondansetron  (ZOFRAN ) 4 MG tablet      . oxyCODONE  (ROXICODONE ) 5 MG immediate release tablet Take by mouth.    . polyethylene glycol (MIRALAX ) 17 gram/dose powder Take 17 g by mouth daily as needed for Constipation.    . rizatriptan (MAXALT) 10 MG tablet Take 1 tablet (10 mg total) by mouth as needed for Migraine. May repeat in 2 hours if needed 10 tablet 2  . rosuvastatin  (CRESTOR ) 5 MG tablet Take 1 tablet (5 mg total) by mouth daily.    . tamsulosin (FLOMAX) 0.4 mg Cap capsule      No current facility-administered medications for this visit.     Allergies:  Allergies  Allergen Reactions  . Anesthetics - Amide Type - Select Amino Amides Nausea & Vomiting (ALLERGY/intolerance)  . Codeine Nausea & Vomiting (ALLERGY/intolerance)  . Oxybutynin Dizziness (intolerance)  . Hyoscyamine Sulfate Other (See Comments)    Pt states they dont know what it is     Past Medical History:  Diagnosis Date  . Arthritis   . Bipolar  1 disorder (HCC)   . DVT (deep venous thrombosis) (HCC)    Right & left legs  . Hypertension   . Kidney disease   . Migraine   . Urinary tract infection      Past Surgical History:  Procedure Laterality Date  . CHOLECYSTECTOMY    . FOOT SURGERY    . HYSTERECTOMY    . JOINT REPLACEMENT Left    Hip  . MOUTH SURGERY    . SHOULDER ARTHROSCOPY    . TUBAL LIGATION      Family  History  Problem Relation Age of Onset  . Heart disease Father   . Cancer Mother     Social History   Socioeconomic History  . Marital status: Widowed    Spouse name: Not on file  . Number of children: Not on file  . Years of education: Not on file  . Highest education level: Not on file  Occupational History  . Not on file  Tobacco Use  . Smoking status: Former    Types: Cigarettes    Quit date: 01/05/2012    Years since quitting: 10.4  . Smokeless tobacco: Never  Substance and Sexual Activity  . Alcohol use: Yes    Comment: socially  . Drug use: No  . Sexual activity: Not Currently    Partners: Male  Other Topics Concern  . Not on file  Social History Narrative  . Not on file   Social Determinants of Health   Food Insecurity: Not on file  Transportation Needs: Not on file  Living Situation: Not on file      Vitals:   06/15/22 1045  BP: 139/87  Pulse: 103  Temp: 98.3 F (36.8 C)  SpO2: 97%  PainSc: 0-Zero  Height: 1.676 m (5' 6)  Weight: 108.4 kg (239 lb)  BMI (Calculated): 38.7     Body mass index is 38.58 kg/m.   ROS:  Constitutional:  Patient denies any unintentional weight loss or change in strength Integumentary:  Patient denies any rashes or pruritus Eyes:  Patient denies double vision or eye pain Ears/Nose/Mouth/Throat:  Patient denies any nosebleeds or gum bleeding Cardiovascular:  Patient denies chest pain or syncope Respiratory: Patient denies hemoptysis  Gastrointestinal:  Patient denies nausea, vomiting, constipation, or diarrhea Musculoskeletal:  Patient denies muscle cramps or weakness Neurologic: Patient denies convulsions or seizures Psychiatric: Patient denies hallucinations or suicidal ideations Alleric/Immunologic: Patient denies food allergies Hematologic/Lymphatic: Patient denies bleeding tendencies Endocrine:  Patient denies heat/cold intolerance  Genitourinary:  As per HPI.  PVR 60 mL   Physical Examination    Constitutional  General appearance: well nourished, well hydrated, no acute distress Chest: Equal and symmetric chest expansion, no increased work of breathing Heart: Regular rate and rhythm Abdomen: Soft nondistended GU: Bladder not palpable, no CVA tenderness MSK: Full range of motion Skin: No evidence rashes or scars.       Plan: Re start Toviaz F/U in 2 mo for Botox injection ( 200 IU)     10/07/2020; Botox  04/23/2021: Botox  10/22/2021: Today patient is back and she states that her incontinence is getting is started again.  Is more than 6 months from her Botox.  She has been seen by our nurse practitioner couple of times because of UTI and having bloody urine.  Urine culture grew 10-15,000 E. coli and symptoms completely resolved with antibiotic.  Today she has states that has some other in her urine but no other symptoms. Also she has states  that Flomax is helping her a lot and she empties very well with no pushing.  PVR is 58 mL today. Basically she is here to be rescheduled for Botox and discussed malodor of urine. She has states that Botox may significant changes to her life and she does not leak anymore . She has GFR of 41 and seen nephrology Recently has developed high blood pressure and getting heart monitor now  Plan: I explained to the patient that she should not get treatment for UTIs if the only symptoms is just malodor of urine Timed voiding Continue Flomax Patient is scheduled for Botox   01/26/2022: Cystoscopy Verbal consent obtained The patient's perineum was prepped with betadine and draped in the usual sterile fashion, with patient in the lithotomy position. The patient was given a prophylactic treatment before the procedure. 2% Xylocaine  jelly was instilled in the urethra. A cystoscopy was then carried out using a flexible cystoscope, and the urethra was inspected using direct vision. The bladder was entered and pandescoped. The scope was removed  without incident.   Findings Bladder: Bladder is normal with no lesions, tumors or diverticuli.  Bladder Comments. . No mass. No mucosal lesion Ureteral orifices: Both ureteral orifices were in their normal anatomic position and appearance.    Urethra: Urethra is normal with no evidence of stricture.  General Findings Comments:   200 units of Botox into 20 mL of saline in 20 different spots in bladder injected. Patient is taking Eliquis .  She did not hold it.  She will hold it if urine becomes very bloody.  There was no significant bleeding during the procedure at all.  Plan Follow-up in 6 months   02/08/2022:   Today patient back 12 days after injecting Botox and she has states that he still has urge incontinence.  In more detail history she has incontinence just when she is in the bathroom and sitting on commode and trying to pull out her pants. She is not doing timed voiding.  PVR is 0. She has states every time after Botox he was doing great but this is not working as good.  Also she has states she is taking her anticholinergic.   Plan: Timed voiding every hour. If does not get better will do Botox in 4 months with 300 units     03/11/2022: Today patient is back 1 month after previous visit.  The reason is she has had a CT scan and in the report they have mentioned that one of the cyst has grown from 1.5 to 1.8 cm and they have suggested potential differential diagnosis of complex cyst or papillary carcinoma of the kidney.  MRI has been recommended She was very is anxious about it and she wants to make sure that it does not have any cancer. I explained to her that we had seen this lesion in 2 years ago we have done MRI of the kidney and it showed just multiple cyst that very possible related to lithium  intake. She states her GI doctor was very concerned about this potential finding.  Plan: Reassurance to the patient that this lesion has been detected before and fully evaluated and  with MRI and it was cyst We will repeat MRI in 4 months mainly to confirm that there is no cancer he needs to make the patient more comfortable   06/15/2022: Cystoscopy Verbal consent obtained The patient's perineum was prepped with betadine and draped in the usual sterile fashion, with patient in the lithotomy position. The  patient was given a prophylactic treatment before the procedure. 2% Xylocaine  jelly was instilled in the urethra. A cystoscopy was then carried out using a flexible cystoscope, and the urethra was inspected using direct vision. The bladder was entered and pandescoped. The scope was removed without incident.   Findings Bladder: Bladder is normal with no lesions, tumors or diverticuli.  Bladder Comments. . No mass. No mucosal lesion Ureteral orifices: Both ureteral orifices were in their normal anatomic position and appearance.   SABRA Urethra: Urethra is normal with no evidence of stricture.  General Findings Comments:  100 units of Botox and 10 mL of saline into the spot in bladder injected.  Patient reports that recently has significant incontinence.  Also her GFR has come down to 30. Today her PVR was 250 mL and I did not inject 300 units of Botox as was supposed to.  She is very interested to have at least 100 units of Botox.   Plan:  Pelvic physical therapy to help with emptying the bladder Refer to nephrology  follow-up for her mass with MRI in future   09/02/2022: Patient back after 2 months.  She is doing great no leakage anymore PVR is 12 mL... MRI shows that the cysts in the kidneys and none of them of any concern.  Most of them are simple cysts and a couple of them has hemorrhagic cyst.  She is losing weight.  Her creatinine has come to baseline which is 1.65.  She is in very great shape now. It seems that Botox 100 units per 6 months is the best dose that works for her.  Plan: F/U in 4 mo for botox 100 iu PVR  01/06/2023: Cystoscopy Verbal consent  obtained The patient's perineum was prepped with betadine and draped in the usual sterile fashion, with patient in the lithotomy position. The patient was given a prophylactic treatment before the procedure. 2% Xylocaine  jelly was instilled in the urethra. A cystoscopy was then carried out using a flexible cystoscope, and the urethra was inspected using direct vision. The bladder was entered and pandescoped. The scope was removed without incident.   Findings Bladder: Bladder is normal with no lesions, tumors or diverticuli.  Bladder Comments. . No mass. No mucosal lesion Ureteral orifices: Both ureteral orifices were in their normal anatomic position and appearance.    Urethra: Urethra is normal with no evidence of stricture.  General Findings Comments:  100 IU of Botox in 10 ml of salin in 10 different spots in bladder injected Patient is doing great with 100 Iu of Botox.  She states with 100 units of Botox she just needed to wear pads for precaution otherwise was not wet  Plan;' Patient is scheduled to get Botox 100 units in 6 months    04/04/2023: Patient back after about 3 months she states the first couple of months she will complete dry but is still noted to have leakage again.  Also she has had 3 UTIs in the past 3 months.  I have the result of one of them that shows 10,000-25,000 E. coli.  Sensitive to cephalexin.  Her GF9R is at 83.  She is taking cephalexin and symptoms are gone and the she developed some itching she has some 12 years left from using before to Botox.   Plan: I asked her to bring a urine for culture to our facility whenever she feels that the infection Patient is scheduled for Botox 200 units She might need  prophylaxis antibiotic if  UTIs happens again Will check with nephrology about kidney function.     04/17/2023:  The patient is back after 2 weeks.  She complains that she has severe bladder pain.  He has severe incontinence.  Frequency and urgency.  No  radiation to right flank pain.  She had many ED.  His CT scan without contrast has been done. And showed stranding around the right ureter has been found.  Diagnosed with pyelonephritis.  He has states she has had fever up to 100.5. A urine test has been sent for her yesterday and shows just white blood cells.  No bacteria given IV antibiotic and some antibiotic to take at home. I have restarted her on hydroxyzine.She has states it makes her very sleepy.  She states She is taking oxycodone  x 2 for more than 1 year because of back pain. She drinks 2 cups of coffee in the morning and some spicy food.  Plan: Impression of IC: IC diet Amitriptyline  25 mg twice daily Hydroxyzine continue and if positive burning character.  Twice daily She might need Elmoran Scharold in 2 months. If there symptomd persi we will need HPLD and Botox  05/22/2023: PROCEDURES PERFORMED: 1. Cystourethroscopy. 2. Hydrodistention of the bladder under general anesthesia. 3. Bilateral pudendal nerve block with bupivacaine  and triamcinolone  4. Bladder installation with phenazopyridine and bupivacaine . 5. Intradetrusor injection of botulinum toxin (botox) 100 units   07/04/2023: Today patient back after 6 weeks after procedure.  She had a few phone calls after surgery because of burning and pain.  The urine culture showed pansensitive E. coli.  She has started antibiotic she is much better now.  Today PVR is 141.  I checked with the catheterization because after bladder scan it was 454 ml. Overall today she is her main complaint is urgency.  She had needs to use cane to go to bathroom.  She is very slow.  She goes every 2 hour to bathroom but many times she cannot make it to the bathroom.  Also she is taking the hydroxyzine amitriptyline  and Flomax.  At night she usually does not wake up and ended up with wetting the bed. She takes 1 hydroxyzine and 1 amitriptyline  both at night. Her pain after hydrodistention and Botox  injection is complete done.  She needs over more than 3 pads a day.  She still drinks 2 cup of coffee and 1 glass of tea per day  Plan: Timed voiding Decrease caffeine intake Hydroxyzine will be taken in the morning Continue amitriptyline  at night Follow-up in 6 months, if her symptoms or back pain needs Botox injection Otherwise we will continue medical treatment    09/22/2023: Cystoscopy Verbal consent obtained The patient's perineum was prepped with betadine and draped in the usual sterile fashion, with patient in the lithotomy position. The patient was given a prophylactic treatment before the procedure. 2% Xylocaine  jelly was instilled in the urethra. A cystoscopy was then carried out using a flexible cystoscope, and the urethra was inspected using direct vision. The bladder was entered and pandescoped. The scope was removed without incident.   Findings Bladder: Bladder is normal with no lesions, tumors or diverticuli.  Bladder Comments. . No mass. No mucosal lesion Ureteral orifices: Both ureteral orifices were in their normal anatomic position and appearance.    Urethra: Urethra is normal with no evidence of stricture.  General Findings Comments:    100 IU of Botox in 10 ml of saline in 20 different sopots in the bladder injected  Plan: F/u in 6 for Botox injection ( might need 200 IuUif symptoms recur soon  04/05/2024: Cystoscopy Verbal consent obtained The patient's perineum was prepped with betadine and draped in the usual sterile fashion, with patient in the lithotomy position. The patient was given a prophylactic treatment before the procedure. 2% Xylocaine  jelly was instilled in the urethra. A cystoscopy was then carried out using a flexible cystoscope, and the urethra was inspected using direct vision. The bladder was entered and pandescoped. The scope was removed without incident.   Findings Bladder: Bladder is normal with no lesions, tumors or diverticuli.  Bladder  Comments. . No mass. No mucosal lesion Ureteral orifices: Both ureteral orifices were in their normal anatomic position and appearance.    Urethra: Urethra is normal with no evidence of stricture.  General Findings Comments:  100 IU of Botox in 10 mL normal saline in 10 different spots and bladder injected. Patient has some urge incontinence but the main problem with postvoid dribbling. Her ureteral ends in mid vagina and mainly   vaginal voiding   Plan 1: I recommended leg suppression during voiding Also hold labia with fingers to empty better Follow-up in 6 months for Botox

## 2024-04-23 DIAGNOSIS — H16223 Keratoconjunctivitis sicca, not specified as Sjogren's, bilateral: Secondary | ICD-10-CM | POA: Diagnosis not present

## 2024-04-24 DIAGNOSIS — N200 Calculus of kidney: Secondary | ICD-10-CM | POA: Diagnosis not present

## 2024-04-30 DIAGNOSIS — N184 Chronic kidney disease, stage 4 (severe): Secondary | ICD-10-CM | POA: Diagnosis not present

## 2024-04-30 DIAGNOSIS — M7061 Trochanteric bursitis, right hip: Secondary | ICD-10-CM | POA: Diagnosis not present

## 2024-05-03 DIAGNOSIS — I10 Essential (primary) hypertension: Secondary | ICD-10-CM | POA: Diagnosis not present

## 2024-05-03 DIAGNOSIS — N189 Chronic kidney disease, unspecified: Secondary | ICD-10-CM | POA: Diagnosis not present

## 2024-05-03 DIAGNOSIS — J449 Chronic obstructive pulmonary disease, unspecified: Secondary | ICD-10-CM | POA: Diagnosis not present

## 2024-05-03 DIAGNOSIS — N184 Chronic kidney disease, stage 4 (severe): Secondary | ICD-10-CM | POA: Diagnosis not present

## 2024-05-05 DIAGNOSIS — N1832 Chronic kidney disease, stage 3b: Secondary | ICD-10-CM | POA: Diagnosis not present

## 2024-05-05 DIAGNOSIS — J449 Chronic obstructive pulmonary disease, unspecified: Secondary | ICD-10-CM | POA: Diagnosis not present

## 2024-05-05 DIAGNOSIS — M1990 Unspecified osteoarthritis, unspecified site: Secondary | ICD-10-CM | POA: Diagnosis not present

## 2024-05-05 DIAGNOSIS — N184 Chronic kidney disease, stage 4 (severe): Secondary | ICD-10-CM | POA: Diagnosis not present

## 2024-05-05 DIAGNOSIS — I10 Essential (primary) hypertension: Secondary | ICD-10-CM | POA: Diagnosis not present

## 2024-05-05 DIAGNOSIS — N189 Chronic kidney disease, unspecified: Secondary | ICD-10-CM | POA: Diagnosis not present

## 2024-05-06 NOTE — Telephone Encounter (Signed)
 Patient is calling in regards to previous message  STATUS UPDATE   Mrs. Dana [name] would like the results from her MRI.  Patient is calling to the results from her imaging and confirm when she needs to follow up    [phone]

## 2024-05-07 DIAGNOSIS — N184 Chronic kidney disease, stage 4 (severe): Secondary | ICD-10-CM | POA: Diagnosis not present

## 2024-05-07 DIAGNOSIS — N1419 Nephropathy induced by other drugs, medicaments and biological substances: Secondary | ICD-10-CM | POA: Diagnosis not present

## 2024-05-07 DIAGNOSIS — I129 Hypertensive chronic kidney disease with stage 1 through stage 4 chronic kidney disease, or unspecified chronic kidney disease: Secondary | ICD-10-CM | POA: Diagnosis not present

## 2024-05-07 DIAGNOSIS — N281 Cyst of kidney, acquired: Secondary | ICD-10-CM | POA: Diagnosis not present

## 2024-05-07 DIAGNOSIS — T56891A Toxic effect of other metals, accidental (unintentional), initial encounter: Secondary | ICD-10-CM | POA: Diagnosis not present

## 2024-05-07 DIAGNOSIS — D631 Anemia in chronic kidney disease: Secondary | ICD-10-CM | POA: Diagnosis not present

## 2024-05-07 DIAGNOSIS — N2581 Secondary hyperparathyroidism of renal origin: Secondary | ICD-10-CM | POA: Diagnosis not present

## 2024-05-08 DIAGNOSIS — L538 Other specified erythematous conditions: Secondary | ICD-10-CM | POA: Diagnosis not present

## 2024-05-08 DIAGNOSIS — Z789 Other specified health status: Secondary | ICD-10-CM | POA: Diagnosis not present

## 2024-05-08 DIAGNOSIS — R208 Other disturbances of skin sensation: Secondary | ICD-10-CM | POA: Diagnosis not present

## 2024-05-08 DIAGNOSIS — L728 Other follicular cysts of the skin and subcutaneous tissue: Secondary | ICD-10-CM | POA: Diagnosis not present

## 2024-05-09 ENCOUNTER — Other Ambulatory Visit: Payer: Self-pay | Admitting: Cardiology

## 2024-05-09 DIAGNOSIS — R072 Precordial pain: Secondary | ICD-10-CM

## 2024-05-09 DIAGNOSIS — R002 Palpitations: Secondary | ICD-10-CM

## 2024-05-21 ENCOUNTER — Other Ambulatory Visit: Payer: Self-pay

## 2024-05-21 ENCOUNTER — Emergency Department (HOSPITAL_COMMUNITY)
Admission: EM | Admit: 2024-05-21 | Discharge: 2024-05-21 | Disposition: A | Discharge status: Home / Self Care | Source: Ambulatory Visit | Attending: Emergency Medicine | Admitting: Emergency Medicine

## 2024-05-21 ENCOUNTER — Emergency Department (HOSPITAL_COMMUNITY)

## 2024-05-21 ENCOUNTER — Telehealth: Payer: Self-pay

## 2024-05-21 ENCOUNTER — Encounter (HOSPITAL_COMMUNITY): Payer: Self-pay

## 2024-05-21 DIAGNOSIS — R10A0 Flank pain, unspecified side: Secondary | ICD-10-CM | POA: Diagnosis present

## 2024-05-21 DIAGNOSIS — Z86718 Personal history of other venous thrombosis and embolism: Secondary | ICD-10-CM | POA: Diagnosis not present

## 2024-05-21 DIAGNOSIS — J449 Chronic obstructive pulmonary disease, unspecified: Secondary | ICD-10-CM | POA: Diagnosis not present

## 2024-05-21 DIAGNOSIS — R10A3 Flank pain, bilateral: Secondary | ICD-10-CM

## 2024-05-21 DIAGNOSIS — N189 Chronic kidney disease, unspecified: Secondary | ICD-10-CM | POA: Diagnosis not present

## 2024-05-21 DIAGNOSIS — I129 Hypertensive chronic kidney disease with stage 1 through stage 4 chronic kidney disease, or unspecified chronic kidney disease: Secondary | ICD-10-CM | POA: Diagnosis not present

## 2024-05-21 DIAGNOSIS — Z7901 Long term (current) use of anticoagulants: Secondary | ICD-10-CM | POA: Diagnosis not present

## 2024-05-21 LAB — CBC
HCT: 43 % (ref 36.0–46.0)
Hemoglobin: 14.3 g/dL (ref 12.0–15.0)
MCH: 30.3 pg (ref 26.0–34.0)
MCHC: 33.3 g/dL (ref 30.0–36.0)
MCV: 91.1 fL (ref 80.0–100.0)
Platelets: 134 K/uL — ABNORMAL LOW (ref 150–400)
RBC: 4.72 MIL/uL (ref 3.87–5.11)
RDW: 13.7 % (ref 11.5–15.5)
WBC: 7.3 K/uL (ref 4.0–10.5)
nRBC: 0 % (ref 0.0–0.2)

## 2024-05-21 LAB — COMPREHENSIVE METABOLIC PANEL WITH GFR
ALT: 23 U/L (ref 0–44)
AST: 22 U/L (ref 15–41)
Albumin: 4.1 g/dL (ref 3.5–5.0)
Alkaline Phosphatase: 63 U/L (ref 38–126)
Anion gap: 14 (ref 5–15)
BUN: 46 mg/dL — ABNORMAL HIGH (ref 8–23)
CO2: 18 mmol/L — ABNORMAL LOW (ref 22–32)
Calcium: 10 mg/dL (ref 8.9–10.3)
Chloride: 111 mmol/L (ref 98–111)
Creatinine, Ser: 2.62 mg/dL — ABNORMAL HIGH (ref 0.44–1.00)
GFR, Estimated: 20 mL/min — ABNORMAL LOW (ref >60)
Glucose, Bld: 93 mg/dL (ref 70–99)
Potassium: 4.3 mmol/L (ref 3.5–5.1)
Sodium: 142 mmol/L (ref 135–145)
Total Bilirubin: 0.4 mg/dL (ref 0.0–1.2)
Total Protein: 6.2 g/dL — ABNORMAL LOW (ref 6.5–8.1)

## 2024-05-21 LAB — URINALYSIS, ROUTINE W REFLEX MICROSCOPIC
Bacteria, UA: NONE SEEN
Bilirubin Urine: NEGATIVE
Glucose, UA: NEGATIVE mg/dL
Hgb urine dipstick: NEGATIVE
Ketones, ur: NEGATIVE mg/dL
Nitrite: NEGATIVE
Protein, ur: NEGATIVE mg/dL
Specific Gravity, Urine: 1.004 — ABNORMAL LOW (ref 1.005–1.030)
pH: 6 (ref 5.0–8.0)

## 2024-05-21 NOTE — ED Provider Triage Note (Signed)
 Emergency Medicine Provider Triage Evaluation Note  Dana [name] , a 63 y.o. female  was evaluated in triage.  Pt complains of bilateral lower back pain.  Patient has stage IV kidney disease.  Patient is concerned that she has a kidney stone.  She also has a bruise on her right leg  Review of Systems  Positive: Back pain Negative: Fever or chills  Physical Exam  BP 119/72   Pulse 95   Temp 98.3 F (36.8 C) (Oral)   Resp 18   Ht 5' 6 (1.676 m)   Wt 100.7 kg   SpO2 95%   BMI 35.83 kg/m  Gen:   Awake, no distress   Resp:  Normal effort  MSK:   Moves extremities without difficulty  Other:    Medical Decision Making  Medically screening exam initiated at 2:36 PM.  Appropriate orders placed.  Dana [name] was informed that the remainder of the evaluation will be completed by another provider, this initial triage assessment does not replace that evaluation, and the importance of remaining in the ED until their evaluation is complete.     Flint Sonny POUR, PA-C 05/21/24 1437

## 2024-05-21 NOTE — Discharge Instructions (Signed)
 Please follow closely with your PCP and kidney doctors. Return with any new or suddenly worsening symptoms.

## 2024-05-21 NOTE — Telephone Encounter (Signed)
 Returned TC to pt who left a message that she fell in her yard 2 wks ago and has a knot and feels like she needs to see Dr Federico. When pt answered phone, she stated she was in the ED being evaluated for bilateral kidney pain  She reported her leg was evaluated at site of fall injury and she was told she has a hematoma.  Denied need for services from this office at the moment.  Pt reported she has lost 22 lb since 03/26/24 with enrollment at [facility] in [place]. Pt verbalized that she has been very pleased at this success.  Dr Federico made aware.

## 2024-05-21 NOTE — ED Triage Notes (Signed)
 Patient said she has stage 4 kidney disease. Stated both of her kidneys are killing her with pain. Patient fell 2 weeks ago and has a knot on the side of her right leg. History of blood clots. Takes eloquis.

## 2024-05-21 NOTE — ED Provider Notes (Signed)
 Emergency Department Provider Note   I have reviewed the triage vital signs and the nursing notes.   HISTORY  Chief Complaint Flank Pain   HPI Dana [name] is a 63 y.o. female past history reviewed below including COPD, CKD, DVT on Eliquis  presents to the emergency department with back pain.  She has had pain in her kidney area bilaterally for the past month.  She follows with nephrology and the kidney transplant team at [facility].  She had an outpatient ultrasound several weeks ago showing nephrolithiasis without hydronephrosis and cysts.  She was referred for a CT scan but had to miss it yesterday due to fatigue.  That was rescheduled for this week but back symptoms worsened and so she presents to the ED for evaluation.  She also notes a bruise to the right lower leg after a fall.  No posterior leg pain, chest pain, shortness of breath.  She remains compliant with her Eliquis .   Past Medical History:  Diagnosis Date   Arthritis    oa   Bipolar disorder (HCC)    Chronic foot pain    Complication of anesthesia    COPD (chronic obstructive pulmonary disease) (HCC)    DVT (deep venous thrombosis) (HCC)    Involving bilateral lower extremities   GERD (gastroesophageal reflux disease)    Headache    migraines   Hyperlipidemia    Hypertension    Neuropathy    Palpitations last 1 month ago   PONV (postoperative nausea and vomiting)    Sleep apnea     Review of Systems  Constitutional: No fever/chills Cardiovascular: Denies chest pain. Respiratory: Denies shortness of breath. Gastrointestinal: No abdominal pain.  No nausea, no vomiting.  No diarrhea.  No constipation. Genitourinary: Negative for dysuria. Musculoskeletal: Positive for back pain. Skin: Positive bruising to the right leg.  Neurological: Negative for headaches, focal weakness or numbness.   ____________________________________________   PHYSICAL EXAM:  VITAL SIGNS: ED Triage Vitals [05/21/24 1412]   Encounter Vitals Group     BP 119/72     Pulse Rate 95     Resp 18     Temp 98.3 F (36.8 C)     Temp Source Oral     SpO2 95 %     Weight 222 lb (100.7 kg)     Height 5' 6 (1.676 m)   Constitutional: Alert and oriented. Well appearing and in no acute distress. Eyes: Conjunctivae are normal. Head: Atraumatic. Nose: No congestion/rhinnorhea. Mouth/Throat: Mucous membranes are moist. Neck: No stridor.  Cardiovascular: Normal rate, regular rhythm. Good peripheral circulation. Grossly normal heart sounds.   Respiratory: Normal respiratory effort.  No retractions. Lungs CTAB. Gastrointestinal: Soft and nontender. No distention.  Musculoskeletal: No lower extremity tenderness nor edema. No gross deformities of extremities.  Old appearing bruise to the right lateral calf.  No significant leg swelling.  No popliteal tenderness.  Pulses intact bilaterally. Neurologic:  Normal speech and language. No gross focal neurologic deficits are appreciated.  Skin:  Skin is warm, dry and intact. No rash noted.  ____________________________________________   LABS (all labs ordered are listed, but only abnormal results are displayed)  Labs Reviewed  URINALYSIS, ROUTINE W REFLEX MICROSCOPIC - Abnormal; Notable for the following components:      Result Value   Color, Urine STRAW (*)    Specific Gravity, Urine 1.004 (*)    Leukocytes,Ua SMALL (*)    All other components within normal limits  CBC - Abnormal; Notable for  the following components:   Platelets 134 (*)    All other components within normal limits  COMPREHENSIVE METABOLIC PANEL WITH GFR - Abnormal; Notable for the following components:   CO2 18 (*)    BUN 46 (*)    Creatinine, Ser 2.62 (*)    Total Protein 6.2 (*)    GFR, Estimated 20 (*)    All other components within normal limits  URINE CULTURE   ____________________________________________  RADIOLOGY  CT Renal Stone Study Result Date: 05/21/2024 EXAM: CT ABDOMEN AND  PELVIS WITHOUT CONTRAST 05/21/2024 03:13:03 PM TECHNIQUE: CT of the abdomen and pelvis was performed without the administration of intravenous contrast. Multiplanar reformatted images are provided for review. Automated exposure control, iterative reconstruction, and/or weight-based adjustment of the mA/kV was utilized to reduce the radiation dose to as low as reasonably achievable. COMPARISON: CT abdomen and pelvis 05/14/2023. CLINICAL HISTORY: Abdominal/flank pain, stone suspected. Stage 4 kidney disease. Bilateral low back pain. FINDINGS: LOWER CHEST: No acute abnormality. LIVER: Scattered simple liver cysts, largest 1.9 cm in the posterior lower right liver. GALLBLADDER AND BILE DUCTS: Cholecystectomy. No biliary ductal dilatation. SPLEEN: No acute abnormality. PANCREAS: No acute abnormality. ADRENAL GLANDS: No acute abnormality. KIDNEYS, URETERS AND BLADDER: Numerous homogeneous low attenuation renal cortical lesions in both kidneys, largest 2.2 cm in the anterior lower left kidney on series 2/image 44, not appreciably changed. A few minimally complex renal cysts in the lower left kidney with layering calcification, largest 1.9 cm on image 47, not substantially changed. No hydronephrosis. No perinephric or periureteral stranding. Normal caliber ureters. No ureteral stones. Normal bladder. GI AND BOWEL: Stomach demonstrates no acute abnormality. Normal caliber small bowel loops with no small bowel wall thickening. Normal appendix. Mild sigmoid diverticulosis. No large bowel wall thickening or significant pericolonic fat stranding. PERITONEUM AND RETROPERITONEUM: No ascites. No free air. VASCULATURE: Atherosclerotic nonaneurysmal abdominal aorta. LYMPH NODES: No lymphadenopathy. REPRODUCTIVE ORGANS: Hysterectomy. No adnexal masses. BONES AND SOFT TISSUES: Left total hip arthroplasty. Moderate thoracolumbar spondylitis. No focal soft tissue abnormality. IMPRESSION: 1. No acute findings in the abdomen or pelvis. No  urolithiasis. 2. Numerous simple and minimally complex renal cysts, not appreciably changed. 3. Mild sigmoid diverticulosis. 4. Aortic Atherosclerosis (ICD10-I70.0). Electronically signed by: Selinda Blue MD 05/21/2024 04:52 PM EST RP Workstation: [id number]    ____________________________________________   PROCEDURES  Procedure(s) performed:   Procedures  None  ____________________________________________   INITIAL IMPRESSION / ASSESSMENT AND PLAN / ED COURSE  Pertinent labs & imaging results that were available during my care of the patient were reviewed by me and considered in my medical decision making (see chart for details).   This patient is Presenting for Evaluation of back pain, which does require a range of treatment options, and is a complaint that involves a high risk of morbidity and mortality.  The Differential Diagnoses includes but is not exclusive to musculoskeletal back pain, renal colic, urinary tract infection, pyelonephritis, intra-abdominal causes of back pain, aortic aneurysm or dissection, cauda equina syndrome, sciatica, lumbar disc disease, thoracic disc disease, etc.  Clinical Laboratory Tests Ordered, included no UTI.  CBC without leukocytosis.  No acute kidney injury.  GFR 20.  Radiologic Tests Ordered, included CT renal. I independently interpreted the images and agree with radiology interpretation.   Cardiac Monitor Tracing which shows NSR.    Social Determinants of Health Risk patient is not an active smoker.   Medical Decision Making: Summary:  Patient reports multiple weeks of back pain worsening in the past several days.  Known nephrolithiasis although no hydro on ultrasound from outside facility.  Plan for CT renal along with screening blood work and reassess.  Reevaluation with update and discussion with patient.  Labs are reassuring.  Pain well-controlled.  She has medications at home to help with her back pain.  Stable for discharge.    Patient's presentation is most consistent with acute presentation with potential threat to life or bodily function.   Disposition: discharge  ____________________________________________  FINAL CLINICAL IMPRESSION(S) / ED DIAGNOSES  Final diagnoses:  Bilateral flank pain    Note:  This document was prepared using Dragon voice recognition software and may include unintentional dictation errors.  Fonda Law, MD, [facility] Emergency Medicine    Kirsta Probert, Fonda MATSU, MD 05/21/24 2159

## 2024-05-23 LAB — URINE CULTURE

## 2024-05-23 NOTE — Telephone Encounter (Signed)
 error

## 2024-05-24 ENCOUNTER — Encounter: Payer: Self-pay | Admitting: *Deleted

## 2024-05-24 NOTE — Progress Notes (Unsigned)
 Hematology clearance form fax'd to [facility] regarding holding Eliquis  x 2 days prior to planned colonoscopy

## 2024-07-10 ENCOUNTER — Other Ambulatory Visit: Payer: Self-pay | Admitting: Hematology and Oncology

## 2024-10-03 ENCOUNTER — Inpatient Hospital Stay

## 2024-10-03 ENCOUNTER — Inpatient Hospital Stay: Admitting: Hematology and Oncology
